# Patient Record
Sex: Female | Born: 1975 | Race: White
Health system: Northeastern US, Academic
[De-identification: ages and names within clinical notes are randomized; demographics above are authoritative.]

## PROBLEM LIST (undated history)

## (undated) DIAGNOSIS — K589 Irritable bowel syndrome without diarrhea: Secondary | ICD-10-CM

## (undated) DIAGNOSIS — Q796 Ehlers-Danlos syndrome, unspecified: Secondary | ICD-10-CM

## (undated) DIAGNOSIS — F3281 Premenstrual dysphoric disorder: Secondary | ICD-10-CM

## (undated) DIAGNOSIS — G909 Disorder of the autonomic nervous system, unspecified: Secondary | ICD-10-CM

## (undated) DIAGNOSIS — G43909 Migraine, unspecified, not intractable, without status migrainosus: Secondary | ICD-10-CM

## (undated) DIAGNOSIS — G8929 Other chronic pain: Secondary | ICD-10-CM

## (undated) DIAGNOSIS — R208 Other disturbances of skin sensation: Secondary | ICD-10-CM

## (undated) DIAGNOSIS — G43809 Other migraine, not intractable, without status migrainosus: Secondary | ICD-10-CM

## (undated) DIAGNOSIS — R5382 Chronic fatigue, unspecified: Secondary | ICD-10-CM

## (undated) DIAGNOSIS — R42 Dizziness and giddiness: Secondary | ICD-10-CM

## (undated) DIAGNOSIS — M7660 Achilles tendinitis, unspecified leg: Secondary | ICD-10-CM

## (undated) DIAGNOSIS — F419 Anxiety disorder, unspecified: Secondary | ICD-10-CM

## (undated) DIAGNOSIS — J45909 Unspecified asthma, uncomplicated: Secondary | ICD-10-CM

## (undated) DIAGNOSIS — G9332 Myalgic encephalomyelitis/chronic fatigue syndrome: Secondary | ICD-10-CM

## (undated) DIAGNOSIS — I959 Hypotension, unspecified: Secondary | ICD-10-CM

## (undated) DIAGNOSIS — J069 Acute upper respiratory infection, unspecified: Secondary | ICD-10-CM

## (undated) DIAGNOSIS — L309 Dermatitis, unspecified: Secondary | ICD-10-CM

## (undated) DIAGNOSIS — K219 Gastro-esophageal reflux disease without esophagitis: Secondary | ICD-10-CM

## (undated) DIAGNOSIS — M792 Neuralgia and neuritis, unspecified: Secondary | ICD-10-CM

## (undated) DIAGNOSIS — E669 Obesity, unspecified: Secondary | ICD-10-CM

## (undated) DIAGNOSIS — K449 Diaphragmatic hernia without obstruction or gangrene: Secondary | ICD-10-CM

## (undated) DIAGNOSIS — M1990 Unspecified osteoarthritis, unspecified site: Secondary | ICD-10-CM

## (undated) DIAGNOSIS — L509 Urticaria, unspecified: Secondary | ICD-10-CM

## (undated) DIAGNOSIS — G933 Postviral fatigue syndrome: Secondary | ICD-10-CM

## (undated) DIAGNOSIS — E162 Hypoglycemia, unspecified: Secondary | ICD-10-CM

## (undated) DIAGNOSIS — I471 Supraventricular tachycardia, unspecified: Secondary | ICD-10-CM

## (undated) DIAGNOSIS — G540 Brachial plexus disorders: Secondary | ICD-10-CM

## (undated) DIAGNOSIS — R0602 Shortness of breath: Secondary | ICD-10-CM

## (undated) DIAGNOSIS — R5383 Other fatigue: Secondary | ICD-10-CM

## (undated) DIAGNOSIS — Z8719 Personal history of other diseases of the digestive system: Secondary | ICD-10-CM

## (undated) DIAGNOSIS — N393 Stress incontinence (female) (male): Secondary | ICD-10-CM

## (undated) DIAGNOSIS — M357 Hypermobility syndrome: Secondary | ICD-10-CM

## (undated) DIAGNOSIS — M5126 Other intervertebral disc displacement, lumbar region: Secondary | ICD-10-CM

## (undated) DIAGNOSIS — T8859XA Other complications of anesthesia, initial encounter: Secondary | ICD-10-CM

## (undated) DIAGNOSIS — G4733 Obstructive sleep apnea (adult) (pediatric): Secondary | ICD-10-CM

## (undated) DIAGNOSIS — G4700 Insomnia, unspecified: Secondary | ICD-10-CM

## (undated) DIAGNOSIS — R200 Anesthesia of skin: Secondary | ICD-10-CM

## (undated) DIAGNOSIS — E785 Hyperlipidemia, unspecified: Secondary | ICD-10-CM

## (undated) DIAGNOSIS — R21 Rash and other nonspecific skin eruption: Secondary | ICD-10-CM

## (undated) DIAGNOSIS — I499 Cardiac arrhythmia, unspecified: Secondary | ICD-10-CM

## (undated) HISTORY — DX: Urticaria, unspecified: L50.9

## (undated) HISTORY — DX: Unspecified osteoarthritis, unspecified site: M19.90

## (undated) HISTORY — DX: Achilles tendinitis, unspecified leg: M76.60

## (undated) HISTORY — PX: TREATMENT FISTULA ANAL: SUR1390

## (undated) HISTORY — PX: TONSILLECTOMY: SUR1361

## (undated) HISTORY — DX: Neuralgia and neuritis, unspecified: M79.2

## (undated) HISTORY — DX: Anxiety disorder, unspecified: F41.9

## (undated) HISTORY — DX: Other migraine, not intractable, without status migrainosus: G43.809

## (undated) HISTORY — DX: Acute upper respiratory infection, unspecified: J06.9

## (undated) HISTORY — DX: Hypoglycemia, unspecified: E16.2

## (undated) HISTORY — DX: Dermatitis, unspecified: L30.9

## (undated) HISTORY — DX: Hypotension, unspecified: I95.9

## (undated) HISTORY — DX: Brachial plexus disorders: G54.0

## (undated) HISTORY — DX: Gastro-esophageal reflux disease without esophagitis: K21.9

## (undated) HISTORY — PX: INCISION AND DRAINAGE ABSCESS ANAL: SUR669

## (undated) HISTORY — DX: Premenstrual dysphoric disorder: F32.81

## (undated) HISTORY — DX: Chronic fatigue, unspecified: R53.82

## (undated) HISTORY — DX: Supraventricular tachycardia, unspecified: I47.10

## (undated) HISTORY — DX: Obesity, unspecified: E66.9

## (undated) HISTORY — DX: Other chronic pain: G89.29

## (undated) HISTORY — PX: SINOSCOPY: SHX187

## (undated) HISTORY — DX: Irritable bowel syndrome, unspecified: K58.9

## (undated) HISTORY — DX: Postviral fatigue syndrome: G93.3

## (undated) HISTORY — DX: Dizziness and giddiness: R42

## (undated) HISTORY — PX: ADENOIDECTOMY: SUR15

## (undated) HISTORY — DX: Ehlers-Danlos syndrome, unspecified: Q79.60

## (undated) HISTORY — DX: Myalgic encephalomyelitis/chronic fatigue syndrome: G93.32

## (undated) HISTORY — DX: Unspecified asthma, uncomplicated: J45.909

## (undated) HISTORY — DX: Other disturbances of skin sensation: R20.8

## (undated) HISTORY — DX: Migraine, unspecified, not intractable, without status migrainosus: G43.909

## (undated) HISTORY — DX: Disorder of the autonomic nervous system, unspecified: G90.9

## (undated) HISTORY — DX: Diaphragmatic hernia without obstruction or gangrene: K44.9

## (undated) HISTORY — DX: Supraventricular tachycardia: I47.1

## (undated) HISTORY — PX: HEMORROIDECTOMY: SUR656

## (undated) HISTORY — DX: Shortness of breath: R06.02

## (undated) HISTORY — PX: LUMBAR LAMINECTOMY: SHX95

## (undated) HISTORY — DX: Other intervertebral disc displacement, lumbar region: M51.26

## (undated) HISTORY — DX: Other complications of anesthesia, initial encounter: T88.59XA

## (undated) HISTORY — DX: Other fatigue: R53.83

## (undated) HISTORY — DX: Morbid (severe) obesity due to excess calories: E66.01

## (undated) HISTORY — DX: Hypermobility syndrome: M35.7

## (undated) HISTORY — DX: Irritable bowel syndrome without diarrhea: K58.9

## (undated) HISTORY — DX: Cardiac arrhythmia, unspecified: I49.9

## (undated) HISTORY — PX: TONSILLECTOMY AND ADENOIDECTOMY: SHX28

## (undated) HISTORY — DX: Rash and other nonspecific skin eruption: R21

## (undated) HISTORY — DX: Hyperlipidemia, unspecified: E78.5

## (undated) HISTORY — DX: Anesthesia of skin: R20.0

## (undated) HISTORY — PX: GASTRECTOMY: SHX58

## (undated) HISTORY — DX: Stress incontinence (female) (male): N39.3

## (undated) HISTORY — PX: SPINE SURGERY: SHX786

## (undated) HISTORY — PX: OTHER SURGICAL HISTORY: SHX169

## (undated) HISTORY — DX: Insomnia, unspecified: G47.00

## (undated) HISTORY — DX: Personal history of other diseases of the digestive system: Z87.19

---

## 2009-03-25 ENCOUNTER — Ambulatory Visit
Admit: 2009-03-25 | Discharge: 2009-03-25 | Disposition: A | Discharge status: Home / Self Care | Payer: Self-pay | Source: Ambulatory Visit | Attending: Obstetrics and Gynecology | Admitting: Obstetrics and Gynecology

## 2009-04-01 ENCOUNTER — Encounter: Payer: Self-pay | Admitting: Obstetrics and Gynecology

## 2009-04-01 ENCOUNTER — Observation Stay
Admit: 2009-04-01 | Disposition: A | Discharge status: Home / Self Care | Payer: Self-pay | Source: Ambulatory Visit | Attending: Obstetrics and Gynecology | Admitting: Obstetrics and Gynecology

## 2009-04-02 ENCOUNTER — Inpatient Hospital Stay
Admit: 2009-04-02 | Disposition: A | Discharge status: Home / Self Care | Payer: Self-pay | Source: Ambulatory Visit | Attending: Obstetrics and Gynecology | Admitting: Obstetrics and Gynecology

## 2010-01-24 ENCOUNTER — Ambulatory Visit: Payer: Self-pay | Admitting: Neurosurgery

## 2010-01-30 ENCOUNTER — Ambulatory Visit: Payer: Self-pay | Admitting: Neurosurgery

## 2010-02-02 HISTORY — PX: OTHER SURGICAL HISTORY: SHX169

## 2010-02-07 ENCOUNTER — Ambulatory Visit: Payer: Self-pay | Admitting: Neurosurgery

## 2010-03-10 ENCOUNTER — Ambulatory Visit: Payer: Self-pay | Admitting: Nurse Practitioner

## 2010-06-16 ENCOUNTER — Ambulatory Visit
Admit: 2010-06-16 | Discharge: 2010-06-16 | Disposition: A | Discharge status: Home / Self Care | Payer: Self-pay | Source: Ambulatory Visit | Attending: Obstetrics and Gynecology | Admitting: Obstetrics and Gynecology

## 2010-06-17 LAB — VAGINITIS SCREEN: DNA PROBE: Vaginitis Screen:DNA Probe: POSITIVE

## 2011-01-22 ENCOUNTER — Ambulatory Visit
Admit: 2011-01-22 | Discharge: 2011-01-22 | Disposition: A | Discharge status: Home / Self Care | Payer: Self-pay | Source: Ambulatory Visit | Attending: Obstetrics and Gynecology | Admitting: Obstetrics and Gynecology

## 2011-01-26 LAB — GYN CYTOLOGY

## 2011-01-26 LAB — HPV DNA PROBE WITH CYTOLOGY: HPV Hybrid Capture: NEGATIVE

## 2012-11-25 ENCOUNTER — Ambulatory Visit: Payer: Self-pay | Admitting: Sports Medicine

## 2012-11-25 ENCOUNTER — Other Ambulatory Visit: Payer: Self-pay | Admitting: Sleep Medicine

## 2012-11-25 ENCOUNTER — Encounter: Payer: Self-pay | Admitting: Sports Medicine

## 2012-11-25 VITALS — BP 133/61 | HR 74 | Ht 68.0 in | Wt 180.0 lb

## 2012-11-25 DIAGNOSIS — M25569 Pain in unspecified knee: Secondary | ICD-10-CM

## 2012-11-25 DIAGNOSIS — M2240 Chondromalacia patellae, unspecified knee: Secondary | ICD-10-CM

## 2012-11-25 DIAGNOSIS — IMO0002 Reserved for concepts with insufficient information to code with codable children: Secondary | ICD-10-CM

## 2012-11-25 MED ORDER — GENERIC DME *A*
Status: DC
Start: 2012-11-25 — End: 2017-12-17

## 2012-11-25 NOTE — Progress Notes (Signed)
History: Shari Harvey is a 37 y.o. that is being seen as a consult request from Dr. Demetra Shiner for evaluation of her Right knee pain.  There was no injury.  She has had pain and swelling in the knee for the past 6 weeks.  She has a history of joint hypermobility and swelling in her joints, but she states it usually resolves spontaneously.  She says that the pain is located patellar.  Aggravating factors include: walking and fully flexing and extending the knee.  Alleviating factors include:  nothing.  she tried treating it with ibuprofen which caused stomach upset.    Past medical history, past surgical history, medications, allergies, family history, social history, and review of systems were reviewed today and have been documented separately in this encounter.      Physical Examination:  She is in no acute distress.  She is alert and oriented x 3.     Examination of her right leg:She has no pain with internal or external rotation of the hip.  She can perform a straight leg raise.  Extension is to 0 and flexion is to 130.  Effusion: 1+.  The knee was tender along the Patella.  She has No patellar apprehension.  Lachman's was 1A.  Posterior drawer exam was Grade A. Varus stress test at 30 degrees of flexion was Negative Valgus stress test at 30 degrees of flexion was Negative.  McMurray's test was negative.  Hip abductor strength is Decreased. Ober's test was negative.  Distally she is neurovascularly intact.     Imaging: I personally reviewed the patients images. Xray's demonstrate some lateral compartment osteophytes and lateral compartment joint space narrowing.    Assessment: Right knee patellofemoral syndrome, lateral compartment OA.    Plan: We discussed the diagnosis of osteoarthritis and methods of treatment including maintaining a healthy weight, relative rest and activity modification, the use of NSAIDS and neutraceuticals such as glucosamine and chondroitin, as well as the role of therapeutic exercise  including directed physical therapy, aquatic therapy, or a home exercise program.  We discussed the role of bracing, corticosteriod injection, and viscosupplementation.      We discussed doing a cortisone injection to try and decreased the swelling.  We will also order inflammatory lab work and wrote her a prescription for a lateral unloader brace.  She will follow up in 6-8 weeks.    PROCEDURE:  Following informed consent, using a sterile technique, 5ml of 0.25% Marcaine without epinephrine and 2ml of Depo-Medrol 40mg /ml was injected into the patient's right knee.  The patient tolerated this without complication.  The patient was given our post-injection instruction sheet.      Jeb Levering PA performed the duties of a scribe for this encounter in the presence of the documenting physician. This patient was seen by Dr. Normajean Glasgow who personally examined the patient and determined the plan of care.

## 2012-11-27 ENCOUNTER — Ambulatory Visit
Admit: 2012-11-27 | Discharge: 2012-11-27 | Disposition: A | Discharge status: Home / Self Care | Payer: Self-pay | Source: Ambulatory Visit | Attending: Sports Medicine | Admitting: Sports Medicine

## 2012-11-27 DIAGNOSIS — M25569 Pain in unspecified knee: Secondary | ICD-10-CM

## 2012-11-27 LAB — CBC AND DIFFERENTIAL
Baso # K/uL: 0 10*3/uL (ref 0.0–0.1)
Basophil %: 0.3 % (ref 0.1–1.2)
Eos # K/uL: 0.2 10*3/uL (ref 0.0–0.4)
Eosinophil %: 1.5 % (ref 0.7–5.8)
Hematocrit: 43 % (ref 34–45)
Hemoglobin: 14.8 g/dL (ref 11.2–15.7)
Lymph # K/uL: 2.5 10*3/uL (ref 1.2–3.7)
Lymphocyte %: 25.3 % (ref 19.3–51.7)
MCV: 93 fL (ref 79–95)
Mono # K/uL: 0.7 10*3/uL (ref 0.2–0.9)
Monocyte %: 6.9 % (ref 4.7–12.5)
Neut # K/uL: 6.4 10*3/uL — ABNORMAL HIGH (ref 1.6–6.1)
Platelets: 352 10*3/uL (ref 160–370)
RBC: 4.7 MIL/uL (ref 3.9–5.2)
RDW: 12.4 % (ref 11.7–14.4)
Seg Neut %: 66 % (ref 34.0–71.1)
WBC: 9.7 10*3/uL (ref 4.0–10.0)

## 2012-11-27 LAB — CRP: CRP: <1 mg/L (ref 0–10)

## 2012-11-27 LAB — CALCIUM: Calcium: 9.3 mg/dL (ref 8.8–10.2)

## 2012-11-27 LAB — SEDIMENTATION RATE, AUTOMATED: Sedimentation Rate: 10 mm/hr (ref 0–20)

## 2012-11-28 LAB — ANTINUCLEAR ANTIBODY SCREEN: ANA Screen: NEGATIVE

## 2012-11-28 LAB — RHEUMATOID FACTOR,SCREEN: Rheumatoid Factor: <10 IU/mL

## 2012-12-01 LAB — VITAMIN D
25-OH VIT D2: <4 ng/mL
25-OH VIT D3: 41 ng/mL
25-OH Vit Total: 41 ng/mL (ref 30–60)

## 2012-12-10 ENCOUNTER — Telehealth: Payer: Self-pay | Admitting: Sleep Medicine

## 2012-12-10 NOTE — Telephone Encounter (Signed)
Patient notified bloodwork is normal.

## 2012-12-10 NOTE — Telephone Encounter (Signed)
Wondering if she can get blood work results

## 2012-12-11 LAB — HLA-B27 ANTIGEN: HLA-B27: NEGATIVE

## 2013-01-14 ENCOUNTER — Ambulatory Visit: Payer: Self-pay | Admitting: Sports Medicine

## 2013-02-03 ENCOUNTER — Ambulatory Visit: Payer: Self-pay | Admitting: Sports Medicine

## 2014-06-22 ENCOUNTER — Ambulatory Visit
Admit: 2014-06-22 | Discharge: 2014-06-22 | Disposition: A | Discharge status: Home / Self Care | Payer: Self-pay | Source: Ambulatory Visit | Attending: Allergy | Admitting: Allergy

## 2014-06-22 LAB — CBC AND DIFFERENTIAL
Baso # K/uL: 0.1 10*3/uL (ref 0.0–0.1)
Basophil %: 0.6 %
Eos # K/uL: 0.7 10*3/uL — ABNORMAL HIGH (ref 0.0–0.4)
Eosinophil %: 6.8 %
Hematocrit: 47 % — ABNORMAL HIGH (ref 34–45)
Hemoglobin: 15.8 g/dL — ABNORMAL HIGH (ref 11.2–15.7)
IMM Granulocytes #: 0.1 10*3/uL (ref 0.0–0.1)
IMM Granulocytes: 0.9 %
Lymph # K/uL: 1.7 10*3/uL (ref 1.2–3.7)
Lymphocyte %: 16.8 %
MCH: 32 pg/cell (ref 26–32)
MCHC: 34 g/dL (ref 32–36)
MCV: 94 fL (ref 79–95)
Mono # K/uL: 0.5 10*3/uL (ref 0.2–0.9)
Monocyte %: 5.2 %
Neut # K/uL: 6.8 10*3/uL — ABNORMAL HIGH (ref 1.6–6.1)
Nucl RBC # K/uL: 0 10*3/uL (ref 0.0–0.0)
Nucl RBC %: 0 /100 WBC (ref 0.0–0.2)
Platelets: 329 10*3/uL (ref 160–370)
RBC: 5 MIL/uL (ref 3.9–5.2)
RDW: 11.9 % (ref 11.7–14.4)
Seg Neut %: 69.7 %
WBC: 9.8 10*3/uL (ref 4.0–10.0)

## 2014-06-22 LAB — CRP: CRP: 2 mg/L (ref 0–10)

## 2014-06-22 LAB — IGG: IgG: 899 mg/dL (ref 700–1600)

## 2014-06-22 LAB — SEDIMENTATION RATE, AUTOMATED: Sedimentation Rate: 6 mm/hr (ref 0–20)

## 2014-06-22 LAB — IGM: IgM: 74 mg/dL (ref 40–230)

## 2014-06-22 LAB — IGA: IgA: 332 mg/dL (ref 70–400)

## 2014-06-23 LAB — IGE: IgE: 38 kU/L (ref 0–158)

## 2014-06-25 LAB — PNEUMOCOCCAL IGG AB
Pneumo Sero 1 IgG: 1.48 ug/mL
Pneumo Sero 12F IgG: 0.97 ug/mL
Pneumo Sero 14 IgG: 2.19 ug/mL
Pneumo Sero 18C IgG: 2.71 ug/mL
Pneumo Sero 19F IgG: 1.29 ug/mL
Pneumo Sero 23F IgG: 0.99 ug/mL
Pneumo Sero 3 IgG: 0.39 ug/mL
Pneumo Sero 4 IgG: 0.27 ug/mL
Pneumo Sero 6B IgG: 0.51 ug/mL
Pneumo Sero 7F IgG: 2.47 ug/mL
Pneumo Sero 8 IgG: 0.67 ug/mL
Pneumo Sero 9N IgG: 0.29 ug/mL
Pneumo Sero 9V IgG: 0.38 ug/mL
Pneumo Sero5 IgG: 15.84 ug/mL

## 2014-06-25 LAB — HAEMOPHILIUS INFLUENZA B AB: Haemophilus influenzae B AB: 3.4 ug/mL

## 2014-06-25 LAB — TETANUS TOXOID, IGG: Tetanus Ab: 3.6 IU/mL

## 2014-07-29 ENCOUNTER — Ambulatory Visit: Payer: Self-pay | Admitting: Otolaryngology

## 2014-07-29 ENCOUNTER — Encounter: Payer: Self-pay | Admitting: Otolaryngology

## 2014-07-29 ENCOUNTER — Telehealth: Payer: Self-pay | Admitting: Otolaryngology

## 2014-07-29 VITALS — BP 136/83 | HR 92 | Temp 98.1°F | Ht 68.0 in | Wt 178.6 lb

## 2014-07-29 DIAGNOSIS — J019 Acute sinusitis, unspecified: Secondary | ICD-10-CM

## 2014-07-29 DIAGNOSIS — J329 Chronic sinusitis, unspecified: Secondary | ICD-10-CM

## 2014-07-29 LAB — GRAM STAIN

## 2014-07-29 MED ORDER — CLARITHROMYCIN 500 MG PO TABS *I*
500.0000 mg | ORAL_TABLET | Freq: Two times a day (BID) | ORAL | Status: AC
Start: 2014-07-29 — End: 2014-08-18

## 2014-07-29 NOTE — Telephone Encounter (Signed)
Pt calling to see if anything sooner than 3/16.  Her sinus issues are much worse and she started to cry.   Please call her to get her in sooner.   Thank you

## 2014-07-29 NOTE — Telephone Encounter (Signed)
LVM for patient advising that currently nothing sooner w/ any provider

## 2014-07-29 NOTE — Progress Notes (Signed)
Subjective:      Shari Harvey is a 39 y.o. female who presents to the same day urgent ENT consult service with concerns of feeling sick.  She presents today complaining of many ENT complaints and general complains including : fatigue, weight gain, lack of interest in exercise, stiff neck, general malaise and fatigue. Her ENT complains include nasal congestion, sinus congestion, heavy eye lids, pain in the sinuses when she bends over, coughing worse at night, post nasal drip, itchy ears and eyes, dental pain, sore throat, sneezing, decreased smell/taste, headaches.  She does not get ear infections. She can get aural fullness.    Past ENT surgical history includes tonsillectomy at age 82, recurring adenoidectomies with a turbinate reduction, septoplasty at age 49 in [place], [place].   She has multiple environmental allergies/asthma is seeing Dr. Andreas Ohm where she will be starting allergy shots soon. Past history of SCIT that she responded well to. She is currently on Symbicort, Clarinex, Flonase (2.5 months consistently).  She was on 3 weeks of oral steroids in January '16 that she feels did not help. She uses a saline mist spray.   Past history of migraines. She feels her left side is worse because pain is worse on that side.    Her current symptoms began in the Spring of '15 that she was taking allergy medications that were not helping. Allergy symptoms are described as post nasal drip, sore throat, mild cough, itchy eyes/ears, frequent sneezing. She developed a sinus infection in August '15 with thick purulent nasal drainage, stiff neck, productive cough.  Augmentin resolved these symptoms.  Symptoms recurred in October which resolved with Augmentin. Symptoms began again with in January '16, was on Augmentin which was not helping, was then prescribed Avalox for one week (developed achilles pain) without helping symptoms. She was then prescribed Ceftin. She was on 3 weeks of prednisone - does not know the dose.  Chest  xray at that time revealed pneumonia - I do not have access to these records. Repeat Xray was clear.  On 07/06/14 symptoms returned and was prescribed Biaxin. She flew to [place] on 07/16/14. While in [place] and on Biaxin, her symptoms resolved. Biaxin was for ten days and completed on 07/23/14.  When she returned to [place], symptoms returned on 07/24/14 and have been lingering since.    Current symptoms include purulent rhinorrhea, pan sinus pain of the frontal, ethmoidal and maxillary areas, worse on the left, feeling feverish (nothing recorded), and her other baseline symptoms. All other ROS denied.    History:       has a past medical history of Hypermobility syndrome; Asthma; Migraines; and Anxiety.   has past surgical history that includes Tonsillectomy and adenoidectomy; Cesarean Section, Unspecified; and lumbar laminectomy.   reports that she has quit smoking. She does not have any smokeless tobacco history on file.  family history includes Asthma in her sister; Cancer in her mother; Migraines in her sister and sister; Osteoarthritis in her father, sister, and sister; Thyroid disease in her sister and sister.    Allergies:   Shellfish-derived products; No known drug allergy; and No known latex allergy     Medications:     Current Outpatient Prescriptions   Medication Sig    promethazine (PHENERGAN) 25 MG tablet Take 25 mg by mouth every 8 hours as needed    FOR NAUSEA    SYMBICORT 160-4.5 MCG/ACT inhaler Inhale 2 puffs into the lungs 2 times daily    SUMAtriptan (IMITREX) 100 MG  tablet Take 100 mg by mouth as needed for Migraine   Take at onset of headache. May repeat once in 2 hours.    desloratadine (CLARINEX) 5 MG tablet Take 5 mg by mouth daily    sertraline (ZOLOFT) 50 MG tablet Take 50 mg by mouth daily    oxyCODONE-acetaminophen (PERCOCET) 5-325 MG per tablet Take 1 tablet by mouth every 4-6 hours as needed for Pain       acetaminophen (TYLENOL) 500 mg tablet Take 500 mg by mouth every 6 hours  as needed for Pain    Multiple Vitamin (MULTI-VITAMIN PO) Take by mouth    albuterol HFA 108 (90 BASE) MCG/ACT inhaler Inhale 2 puffs into the lungs every 6 hours as needed for Wheezing   Shake well before each use.    Omega-3 Fatty Acids (FISH OIL) 600 MG CAPS Take by mouth    generic DME Lateral unloader brace.     Objective:      BP 136/83 mmHg   Pulse 92   Temp(Src) 36.7 C (98.1 F)   Ht 1.727 m ( )   Wt 81 kg (178 lb 9.2 oz)   BMI 27.16 kg/m2    General:   Normocephalic, well nourished, with appropriate affect in NAD.  A &O x 3.     Head and Face:  The head and face reveal normal facial symmetry without lesions or scars. The salivary glands are palpated and appear normal without tenderness or mass.    EYES:  Non icteric, normal conjunctiva.  EOMI.   Ears: RIGHT EAR:  Ear pinna is normal in appearance with no scars, lesions or masses. Mastoid bone is non tender. The ear canal is clear.  The tympanic membrane is intact without inflammation or perforation. TM is mobile to pneumatic otoscopy without evidence of middle ear effusion.     LEFT EAR:  Ear pinna is normal in appearance with no scars, lesions or masses. Mastoid bone is non tender. The ear canal is clear. The tympanic membrane is intact without inflammation or perforation. TM is mobile to pneumatic otoscopy without evidence of middle ear effusion.    Nose:    External nose is normal. The nasal mucosa is inflamed. The nasal septum is generally midline and there is no evidence of septal perforation or hematoma. The inferior turbinates are normal without masses or obstructions. No polyps are visualized. The paranasal sinuses are non tender.   Mouth / Throat:   ORAL CAVITY: The lips, teeth, tongue and buccal mucosa appear normal without lesions or inflammation.  The uvula and soft palate are normal.  The tonsils are absent - the adenoids are visualized.  Oropharynx is without lesion, inflammation or swelling.   Neck:  There is no asymmetry or cervical  lymphadenopathy noted, though she notes tenderness of level II bilaterally. There are no evident masses, trachea is midline and the thyroid gland reveals no enlargement, tenderness nor mass effect.        Assessment:       39 y.o. female with acute on chronic sinusitis, allergic rhinitis     Plan:     She has a current infection and cultures were taken.  We discussed starting abx or waiting for culture. She said she could not wait any further and Biaxin was prescribed for 20 days. I will order a CT scan at the end of the antibiotics to assess her sinuses. May benefit from surgery. We discussed surgery alone will not be curative. We discussed  the importance of treating her allergies and understands this as she is following up with SCIT.  This may initially worsen her symptoms.  She was given a sample saline rinse bottle. We discussing using Afrin for 3-4 days and overdose was discussed.  She will follow up as already scheduled. It has been a pleasure participating in the care of your patient. Thank you for your [facility] to [facility].  If you have any questions, please do not hesitate to contact me.    Procedure:     Diagnostic nasal endoscopy:  Both nostrils were atomized with phenylephrine and tetracaine.  After the spray she noticed great improvement in her congestion. A flexible fiberoptic nasal endoscope was introduced into both nasal  cavities.The patient tolerated the procedure well without incident.    In the right nasal cavity the osteomeatal complex was edematous, erythematous with yellow drainage posteriorly. No obstruction or polyps. There was yellow purulence seen draining from the sphenoethmoidal recess. The nasopharynx was with large adenoids filling 70% of the NP that was not obstructive.  There was minimal pooling of purulence.      In the left nasal cavity the osteomeatal complex was minimally patent erythema/edema and without obstruction, drainage. The was yellow drainage  seen from the sphenoethmoidal recess.

## 2014-07-30 ENCOUNTER — Encounter: Payer: Self-pay | Admitting: Otolaryngology

## 2014-07-30 NOTE — Communication Body (Signed)
Booked CT SInus w/o contrast at [facility]  3.16.16   8AM (No Prep)  F/U same day 3.16.16  9AM  Dr. Man  Auth#  [id number] exp  4.11.16  Confirmed Pt   2.26.16   1:24PM   SC

## 2014-08-03 ENCOUNTER — Telehealth: Payer: Self-pay | Admitting: Otolaryngology

## 2014-08-03 MED ORDER — DOXYCYCLINE HYCLATE 100 MG PO TABS *I*
100.0000 mg | ORAL_TABLET | Freq: Two times a day (BID) | ORAL | Status: DC
Start: 2014-08-03 — End: 2014-08-22

## 2014-08-03 NOTE — Telephone Encounter (Addendum)
-----   Message from [name], [place] sent at 08/02/2014  7:16 PM EST -----  Regarding: How's she doing?  I saw her last week for a sinus infection. She did not want to wait for cultures. Cultures returned with two bacteria. One was MRSA that was very resistant.  I put her on Biaxin.  The MRSA will not be effected by this.    How is she doing? If she is no better, I wouldn't be surprised.   Let me know,  thanks  T

## 2014-08-03 NOTE — Telephone Encounter (Signed)
Spoke with Shari Harvey, and relayed Shari Harvey's advisement.  She is concerned about the cultures and bacteria that grew.  She is inquiring if the staph infection is MRSA.  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  Also her 39 year old has had the same symptoms, not getting better and is wondering if she has the same thing.  Shari Harvey is requesting a call from Shari Harvey to further discuss her results.  Routing to [employer].     Shari BlenderSiala, Tarek, PA  Acsa Estey, Gearldine Shownherie M, RN      Caller: Unspecified (Today, 9:23 AM)                biaxin will specifically NOT work. Stop it.   May need another two new ones. Will start with Doxycycline for now. I want her togive me an update in two days. At the end of the antibiotic, I may consider starting another one.         Previous Messages

## 2014-08-03 NOTE — Telephone Encounter (Signed)
Spoke with Shari Harvey.  She stated she feels "maybe a tiny bit better".  She stated she is still experiencing yellow nasal drainage, sinus pressure/headaches, frequent coughing at night, and believes she is running fevers at night due to waking with night sweats.  Shari Harvey stated she is drinking plenty of fluids.  I will DW [name], PA for his consideration.

## 2014-08-03 NOTE — Telephone Encounter (Signed)
Patient is asking for clarification that she should continue taking Biaxin in addition to doxycycline.  I will DW Tarek.    Theodosia BlenderSiala, Tarek, PA  Rapheal Masso, Gearldine Shownherie M, RN      Caller: Unspecified (Today, 9:23 AM)                Peggye FormI've prescribed an antibiotic. May need two. For now I will go with one which was sent to her pharmacy.   Have her give me an update in 48 hours.   T

## 2014-08-04 NOTE — Telephone Encounter (Signed)
Theodosia BlenderSiala, Tarek, PA  Chyanne Kohut, Gearldine Shownherie M, RN     Caller: Unspecified (Yesterday, 9:23 AM)                I called her back and left a message saying she does have MRSA. I advised if she calls next time in the office to speak with my nursing staff to help communicate with me. I told her doxycycline is prescribed to treat the MRSA and that we may need to go on Augmentin or so to treat H.flu.   T

## 2014-08-05 NOTE — Telephone Encounter (Signed)
Ptcalling to let Dr Cheri RousSiala know that she doesn't feel a lot better and she is still coughing a lot and coughing up stuff.  Please call her to let her know the next step.   Thank you

## 2014-08-05 NOTE — Telephone Encounter (Signed)
From: Theodosia BlenderSiala, Tarek, [place]     Sent: 08/05/2014   2:50 PM       To: Sheron NightingaleEnt [place] Nurse    I saw her last week for a sinus infection. Cultures grew MRSA which will respond to doxy.  I started her on Doxy two days ago. She called today saying she feels better, though does not feel cured.  I do not expect her to feel cured. I would expect minimal improvement in two days, which she says she does.  She is scared that her culture grew MRSA. I called her two days ago saying cultures did respond to doxy.  It is possible her other bug on culture, (H.flu) would respond to this as well.  So I want her to go on doxy for 5-7 days before I consider adding another antibiotic. What I dont' want to do is give her C.diff.  We must be a little patient here. Continue saline rinses and give doxy some time.

## 2014-08-05 NOTE — Telephone Encounter (Signed)
Call to patient.  She states that she has been taking the doxycycline.  I relayed the information per Shari Harvey to patient.  She states her understanding.  She will call back Monday if she is not improving more by then.

## 2014-08-08 LAB — AEROBIC CULTURE

## 2014-08-09 ENCOUNTER — Telehealth: Payer: Self-pay | Admitting: Otolaryngology

## 2014-08-09 NOTE — Telephone Encounter (Signed)
Still on the doxy.  Routed to [name], [place] for his consideration

## 2014-08-09 NOTE — Telephone Encounter (Signed)
Guess i can't edit documentation.... Great.    Was saying there may not be more that I can do if she comes in besides peeking in her sinuses again. Follow up next week with Dr. Man  And Ct scan as scheduled.

## 2014-08-09 NOTE — Telephone Encounter (Signed)
Having a sinus infectin with MRSA will require time for improvement.  She may have unrealistic expectations.  i suspect her anxiety is playing a factor here.  She will stay on Doxy for now.  You can offer her another appointment with me again and I can repeat nasal endoscopy and re culture.  This may result with little to nothing

## 2014-08-09 NOTE — Telephone Encounter (Signed)
LM for pt to relay Tarek's advisement

## 2014-08-09 NOTE — Telephone Encounter (Signed)
Shari Harvey is calling back to see what the plan is for her treatment.  Since the antibiotic, she states that there has been no change.  She is still sick.  She can be reached at [phone].

## 2014-08-18 ENCOUNTER — Encounter: Payer: Self-pay | Admitting: Otolaryngology

## 2014-08-18 ENCOUNTER — Ambulatory Visit: Payer: Self-pay | Admitting: Otolaryngology

## 2014-08-18 VITALS — BP 126/75 | HR 80 | Temp 97.8°F | Ht 68.0 in | Wt 230.8 lb

## 2014-08-18 DIAGNOSIS — J329 Chronic sinusitis, unspecified: Secondary | ICD-10-CM

## 2014-08-22 ENCOUNTER — Encounter: Payer: Self-pay | Admitting: Otolaryngology

## 2014-08-22 NOTE — Progress Notes (Signed)
Reason for Visit  "Frequent bacterial sinus infections."    HPI  Shari Harvey is a 39 y.o. female who presents with recurrent acute rhinosinusitis. She feels she has been sick for the past year. She reports decreased energy and 60 pound weight gain over the past year. She reports being on 5 antibiotics as well as oral steroids over the past 6 months or so. She was on Augmentin in August, October, and December 2015. Earlier this year she was on Avelox x 7 days (did nothing) and then Ceftin and Biaxin in conjunction with 3 weeks of Medrol. She uses saline irrigations, Flonase twice daily, and Symbicort regularly. She just finished 10 days of doxycycline 1 week ago and feels pretty well. She had allergy testing with Dr. Berlin Hun and was found to have sensitivities to dust mites, tree pollens, and rats ("very allergic ... had them as pets"). She had been previously allergic to dog and rabbit dander and had allergy immunotherapy as a child. She feels her sense of smell is diminished.    The Sino-Nasal Outcome Test (SNOT)-22 score on 08/18/2014 was 64 out 110, with higher scores indicating more severe symptoms.    Nasal Symptom Inventory  Facial or sinus pressure:  Mild   Facial or sinus pain:  None   Headache:  None   Nasal congestion:  Mild   Nasal obstruction:  None   Post-nasal drip:  Mild   Clear nasal discharge:   Mild   Discolored nasal discharge:  Mild   Itchy nose/eyes/throat:  Mild   Nose bleeds: Mild   Tiredness:  Mild   Wheezing:  Mild   Cough:  Mild     Allergies  She is allergic to shellfish-derived products; no known drug allergy; and no known latex allergy.    PMH  She has a past medical history of Hypermobility syndrome; Asthma; Migraines; and Anxiety.    PSH  She has past surgical history that includes Tonsillectomy and adenoidectomy; Cesarean Section, Unspecified; and lumbar laminectomy. She had adenoidectomy in approximately 1982, 1992, and 1998. She had turbinate reduction and septoplasty in  approximately 1998.    FH  Her family history includes Asthma in her sister; Cancer in her mother; Migraines in her sister and sister; Osteoarthritis in her father, sister, and sister; Thyroid disease in her sister and sister.    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 She  reports that she has quit smoking. She does not have any smokeless tobacco history on file. Her alcohol, drug, and sexual activity histories are not on file.    ROS  Positive for items listed in HPI/NSI. Other organ systems negative. Reviewed and scanned into electronic medical record.    PE  Vitals Blood pressure 126/75, pulse 80, temperature 36.6 C (97.8 F), height 1.727 m ( ), weight 104.69 kg (230 lb 12.8 oz). Body mass index is 35.1 kg/(m^2).   General General appearance normal. Alert and cooperative. Voice normal.   Head/Face Normocephalic; atraumatic. No facial skin lesions. Facial strength symmetric.   Eyes Extraocular muscles intact. No nystagmus with lateral gaze.   Ears, Nose, Mouth & Throat Clinical speech thresholds normal. External ears normal. External nose normal. Lips, teeth, and gums normal. Oral mucosa moist, without lesions. Tongue and FOM without masses.    Neck No masses. Normal appearance for age.    Respiratory Normal respiratory effort. No retractions.   Cardiovascular Peripheral vascular system normal.   Lymphatic No palpable  cervical adenopathy.   Neuro/Psych CNII-XII grossly normal. Alert and oriented. Mood and affect normal.     Results   CT sinus (08/18/2014, images reviewed with patient on 08/18/2014) - Mild mucosal thickening in the maxillary and ethmoid sinuses. Minimal mucosal thickening in the frontal and sphenoid sinuses. Small left Haller cell.     Assessment  1. Recurrent acute rhinosinusitis.  2. Allergic rhinitis.    Plan   I reviewed the images of the CT sinus that was performed today. (Ordered by our PA, [name] after a visit in 07/2014.)   I  recommended continuing with her current regimen.   I recommended starting immunotherapy with Dr. Berlin HunQuaidoo.   Return to clinic on an as-needed basis. Certainly we can obtain sinonasal cultures during exacerbations, if needed.

## 2015-02-08 ENCOUNTER — Telehealth: Payer: Self-pay | Admitting: Otolaryngology

## 2015-02-08 NOTE — Telephone Encounter (Signed)
Spoke w/ patient. She stated she had an uncontrollable nosebleed and went to Urgent care yesterday; they cauterized the bleed. She currently has bloody flem. She wants to know if this is bleeding going down her throat instead of out her nose. Please advise.    Call pt at [phone] (H)

## 2015-02-08 NOTE — Telephone Encounter (Signed)
I could see her sometime this week before she goes to [facility]. Not today. If she demands today, you can run it by Dr. Hyacinth Meeker.  T

## 2015-02-08 NOTE — Telephone Encounter (Signed)
Spoke with Shari Harvey.  12 years ago she was using Steroid nasal sprays which caused nosebleeds.  Recently due to allergies she was reluctantly placed back on nasal steroids for severe allergy symptoms.  She had an episode of epistaxis yesterday lasting over 3 hours that she was unable to get under control.  She went to Urgent Care where they pulled out a large clot and cauterized the area.  Since her cauterization she continues to produce frank red blood when she spits.  She stated this is all that she can taste is blood as it continues to run down the back of her throat.  Shari Harvey stated this is consistent bleeding and not intermittent.  She is leaving for [facility] on Friday and concerned.  Dr. Man is out of the office and Maryln Gottron is full.  Routing to [employer] for his advisement.

## 2015-02-08 NOTE — Telephone Encounter (Signed)
Offered appointment with Tarek tomorrow at 10:00 AM.  Advised Herbert if she begins to bleed heavy and/or vomits due to bleeding she should go to the [facility].  Patrici voiced her understanding and agreed with plan.

## 2015-02-09 ENCOUNTER — Encounter: Payer: Self-pay | Admitting: Otolaryngology

## 2015-02-09 ENCOUNTER — Ambulatory Visit: Payer: Self-pay | Admitting: Otolaryngology

## 2015-02-09 VITALS — BP 133/85 | HR 77 | Ht 68.0 in | Wt 219.0 lb

## 2015-02-09 DIAGNOSIS — J309 Allergic rhinitis, unspecified: Secondary | ICD-10-CM

## 2015-02-09 DIAGNOSIS — R040 Epistaxis: Secondary | ICD-10-CM

## 2015-02-09 NOTE — Progress Notes (Signed)
Subjective:      Shari Harvey is a 39 y.o. female who presents to the same day ENT consult service with concerns of nose bleeds.  She's has been seen in our clinic by Dr. Lowella Dandy and myself - last seen in March '16 for sinusitis that responded to antibiotics.  She has had a septoplasty and IT reduction in '98.  Past history of allergic rhinitis where she is undergoing SCIT with Dr. Andreas Ohm.  She is hesitant to use nasal steroids due to recalcitrant epistaxis that last occurred 15 years ago. Since her allergies were getting difficult to treat she re started steroid spray in February '16.  She had minimal bleeding since then. Bad nose bleed was on 9/3 and then on 9/5 where after 3 hours she could not stop bleeding. She was seen by Dr. Laurena Spies at [facility] where cautery was performed on the left anterior nasal septum. He told her to use saline and she forgot. She continued to bleed after that and this morning feels the bleed has finally stopped. She took an 800 mg ibuprofen on 9/5.     No lifting. She gets constipation, not currently.     History:    has a past medical history of Anxiety; Asthma; Hypermobility syndrome; and Migraines.   has a past surgical history that includes Tonsillectomy and adenoidectomy; Cesarean Section, Unspecified; and lumbar laminectomy.   reports that she has quit smoking. She does not have any smokeless tobacco history on file.  family history includes Asthma in her sister; Cancer in her mother; Migraines in her sister and sister; Osteoarthritis in her father, sister, and sister; Thyroid disease in her sister and sister.    Allergies:   Shellfish-derived products and Avelox [moxifloxacin]     Objective:      BP 133/85  Pulse 77  Ht 1.727 m ( )  Wt 99.3 kg (219 lb)  BMI 33.3 kg/m2    General:   Normocephalic, well nourished, with appropriate affect in NAD.  A &O x 3.     Head and Face:  The head and face reveal normal facial symmetry without lesions or scars. The salivary glands are palpated and  appear normal without tenderness or mass.    EYES:  Non icteric, normal conjunctiva.  EOMI.       Nose:    External nose is normal. The nasal mucosa is not inflamed. The nasal septum is generally midline with dried secretions bilaterally - large dried blood on the left - this was removed without bleeding. The inferior turbinates are normal without masses or obstructions. No polyps are visualized. The paranasal sinuses are non tender.   Mouth / Throat:   ORAL CAVITY: The lips, teeth, tongue and buccal mucosa appear normal without lesions or inflammation.  The uvula and soft palate are normal.  The tonsils are non erythematous or swollen.  Oropharynx is without lesion, inflammation or swelling.   Neck:  There is no asymmetry or cervical lymphadenopathy noted in either anterior or posterior neck chains or supraclavicular fossa bilaterally. There are no evident masses, trachea is midline and the thyroid gland reveals no enlargement, tenderness nor mass effect.        Assessment:       39 y.o. female with epistaxis, allergic rhinitis     Plan:     The nose appeared dry - cautery performed prior is well done. The patient was instructed to avoid the following:  lifting anything over 20 lbs, excessive exercise, nose manipulation,  and nose blowing for the next 10 days.  Patient was instructed to increase nasal hydration with saline gel, mist or Vaseline.  Handout on epistaxis was given with instructions how to stop future nose bleeds.  Patient will call back with future epistaxis episodes. It has been a pleasure participating in the care of your patient. Thank you for your referral to [facility].  If you have any questions, please do not hesitate to contact me.

## 2015-02-09 NOTE — Patient Instructions (Signed)
Care and Prevention of Nosebleeds   (Epistaxis)     Most nosebleeds are mere nuisances; but some are quite frightening, and a few are even life threatening.  Physicians classify nosebleeds into two major different types.  1.  Anterior Nosebleed: (Most common) The nosebleed that comes from the front part of the nose and begins with a flow of blood out one or the other nostril if the patient is sitting up or standing.  This is what you have.  2.  Posterior Nosebleed: (Rare) The nosebleed that comes from deep in the nose and flows down the back of the mouth and throat even if the patient is sitting up or standing.    Obviously, if the patient is lying down, even the anterior nosebleeds seem to flow in both directions, especially if the patient is coughing or blowing his nose.  Nevertheless, it is important to try to make the distinction since posterior nosebleeds are often more severe and almost always require the physician's care.  Posterior nosebleeds are more likely to occur in older people, persons with high blood pressure, and in cases of injury to the nose or face.    Nosebleeds in children are almost always of the anterior type.  Anterior nosebleeds are common in dry climates or during the winter months when the dry air parches the nasal membranes so that they crust, crack, and bleed.  This can be prevented if you will place a bit of lubricating cream or ointment about the size of a pea on the end of your fingertip and then rub it up inside the nose, especially on the middle portion (the septum).    Many physicians suggest any of the following lubricating creams or ointments.  They can all be purchased without a prescription: Borofax Ointment, A and D Ointment, Mentholatum, Vicks Vaporub, Nozoil, Aquaphor, and Vaseline.  Up to three applications a day may be needed, but usually every night at bedtime is enough.  Nasal Saline mist is also helpful. Nasal saline gel in an injectable form is newly available marketed  under Neil Med Nasogel Drip free, or Ayr Nasal gel no drip mist.  If the nosebleeds persist, you should see your doctor, who may recommend nasal cautery to the blood vessel that is causing the trouble.      To Stop An Anterior Nosebleed    If you or your child has an anterior nosebleed, you may be able to care for it yourself using the following steps:    1.  Pinch all the soft parts of the nose together between your thumb and two fingers.  2.  Press firmly toward the face - compressing the pinched parts of the nose against the bones of the face.  3.  Hold it for 15 minutes (timed by a clock).  Can also buy a Nasal clamp/ Nasal clip online.  4.  Keep head higher than the level of the heart - sit up or lie with head elevated.   5. Apply ice (crushed in a plastic bag or washcloth) to nose and cheeks.         To Prevent Re-bleeding After Bleeding Has Stopped - follow for at least 10 days - 2 weeks  6.  Do not pick or blow nose (sniffing is all right).  Keep showers luke warm.  7.  Do not strain or bend down to lift anything heavy.  8.  Keep head higher than the level of the heart.        If Re-bleeding Occurs   9.  Spray nose four times on both sides with decongestant spray (such as Afrin, Duration, Neo-Synephrine, etc.).  Cannot use more than 4 days in a row or more bleeding will occur.   10.  Pinch and press nose into face again, as in steps 1-3 above.  11.  Can buy QR/Wound Seal on line - rub this product against the middle wall.  12.  Nasal Cease can be bought online to pack the nose.  11.  Call your doctor if the bleeding persists.    When to Call The Doctor or Go To the Emergency Room  If the nasal bleeding cannot be stopped or keeps reappearing.  If bleeding is rapid or if blood loss is large.  If you feel weak or faint, presumably from blood loss or anxiety.  If bleeding begins by going down the back of the throat rather than the front of the nose.

## 2015-03-02 ENCOUNTER — Telehealth: Payer: Self-pay | Admitting: Otolaryngology

## 2015-03-02 NOTE — Telephone Encounter (Signed)
Patient calling states that she believes she may have a sinus infection.  She was told to come to see our office when this happens due to last time she was in to see Dr. Man it was found by a sinus culture that her infection could only be treated by a specific medication.  She states that she has been sick for about 2 weeks now.  Dr. Man is booking out.  Advised I would send this message to nursing for advisement.

## 2015-03-02 NOTE — Telephone Encounter (Signed)
Call to patient.  She said she is having a lot of coughing.  She has been struggling with her asthma.  She said her head and ears feel full.  She said she is having clear nasal discharge.  She is having tooth pain on her left side with left sided facial pressure.  She is achy and is very tired.  Will route Dr. Man to see if he is able to fit the patient in sooner.

## 2015-03-02 NOTE — Telephone Encounter (Signed)
Call to patient.  I left a VM message requesting a return call.

## 2015-03-02 NOTE — Telephone Encounter (Signed)
What is the duration of symptoms. Clear nasal discharge makes it less likely that her symptoms are from a bacterial infection. Can offer an appointment on October 3 at 7:30 am.

## 2015-03-02 NOTE — Telephone Encounter (Signed)
Patient called back.I tried dialing your extension but there was no answer. I told the patient I would let you know you returned her call.

## 2015-03-03 NOTE — Telephone Encounter (Signed)
Spoke to patient and scheduled appointment on 10/3 at 7:30am.

## 2015-03-03 NOTE — Telephone Encounter (Signed)
Duration of symptoms have been about 2 weeks noted in previous encounter.  Routing to scheduling staff to contact patient and offer appointment per Dr. Man below, March 07, 2015 7:30 AM.  She she should follow up with her PCP if she feels she needs to be seen before then.  Thank you.

## 2015-03-07 ENCOUNTER — Ambulatory Visit: Payer: Self-pay | Admitting: Otolaryngology

## 2015-03-07 ENCOUNTER — Encounter: Payer: Self-pay | Admitting: Otolaryngology

## 2015-03-07 VITALS — BP 118/77 | HR 70 | Ht 68.0 in | Wt 217.8 lb

## 2015-03-07 DIAGNOSIS — J309 Allergic rhinitis, unspecified: Secondary | ICD-10-CM

## 2015-03-07 DIAGNOSIS — J069 Acute upper respiratory infection, unspecified: Secondary | ICD-10-CM

## 2015-03-07 DIAGNOSIS — R040 Epistaxis: Secondary | ICD-10-CM

## 2015-03-07 LAB — GRAM STAIN

## 2015-03-07 NOTE — Addendum Note (Signed)
Addended by: Mauricia Area on: 03/07/2015 08:36 AM     Modules accepted: Orders

## 2015-03-07 NOTE — Progress Notes (Signed)
Reason for Visit  Fever/malaise.    HPI  Shari Harvey is a 39 y.o. female who presents with 3 weeks of malaise, excessive mucus production, cough, and congestion. She feels she has a viral infection or an exacerbation of allergies as the mucus is clear. She has had some lightheadedness as well. She has been using Mucinex, Sudafed, Clarinex, Benadryl, and saline irrigations (when she remembers). She was prescribed montelukast but has not started it. She has been on immunotherapy with Dr. Berlin Hun since April 2016, but is having some trouble increasing the immunotherapy concentrations for trees and grasses. She recently had more epistaxis and was cauterized on the left in an ED. She feels the epistaxis is related to intranasal steroid use. She has stopped Flonase. She does feel the intranasal steroids helped for her allergy symptoms. She has not tried an aerosolized intranasal spray nor has she every tried irrigating with a steroid in the rinse bottle.     When I last saw her in March 2016, she reported decreased energy and 60 pound weight gain over the past year. She reported being on 5 antibiotics as well as oral steroids between August 2015 and March 2016. She was on Augmentin in August, October, and December 2015. In early 2016, she was on Avelox x 7 days (did nothing) and then Ceftin and Biaxin in conjunction with 3 weeks of Medrol. She had allergy testing with Dr. Berlin Hun and was found to have sensitivities to dust mites, tree pollens, and rats ("very allergic ... had them as pets"). She had been previously allergic to dog and rabbit dander and had allergy immunotherapy as a child.     The Sino-Nasal Outcome Test (SNOT)-22 score on 03/07/2015 was 39 out 110, with higher scores indicating more severe symptoms.    Nasal Symptom Inventory  Facial or sinus pressure:  Mild   Facial or sinus pain:  None   Headache:  None   Nasal congestion:  Mild   Nasal obstruction:  None   Post-nasal drip:  Mild   Clear nasal  discharge:   Mild   Discolored nasal discharge:  Mild   Itchy nose/eyes/throat:  Mild   Nose bleeds: Mild   Tiredness:  Mild   Wheezing:  Mild   Cough:  Mild     Allergies  She is allergic to shellfish-derived products; avelox [moxifloxacin]; environmental allergies; and flonase [fluticasone].    PMH  She has a past medical history of Anxiety; Asthma; Hypermobility syndrome; and Migraines.    PSH  She has a past surgical history that includes Tonsillectomy and adenoidectomy; Cesarean Section, Unspecified; and lumbar laminectomy. She had adenoidectomy in approximately 1982, 1992, and 1998. She had turbinate reduction and septoplasty in approximately 1998.    FH  Her family history includes Asthma in her sister; Cancer in her mother; Migraines in her sister and sister; Osteoarthritis in her father, sister, and sister; Thyroid disease in her sister and sister.    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 She  reports that she has quit smoking. She does not have any smokeless tobacco history on file. Her alcohol, drug, and sexual activity histories are not on file.    ROS  Positive for items listed in HPI/NSI. Other organ systems negative. Reviewed and scanned into electronic medical record.    PE  Vitals Blood pressure 118/77, pulse 70, height 1.727 m ( ), weight 98.8 kg (217 lb 12.8 oz). Body mass index is  33.12 kg/(m^2).   General General appearance normal. Alert and cooperative. Voice normal.   Head/Face Normocephalic; atraumatic. No facial skin lesions. Facial strength symmetric.   Eyes Extraocular muscles intact. No nystagmus with lateral gaze.   Ears, Nose, Mouth & Throat Clinical speech thresholds normal. External ears normal. Dark cerumen (small amount) in R EAC. Septum is very dry bilaterally. Cautery site on the left identified. Mucosa is edematous. There is dry stringy mucus.   Neck No masses. Normal appearance for age.    Respiratory Normal respiratory effort.  No retractions.   Cardiovascular Peripheral vascular system normal.   Lymphatic No palpable cervical adenopathy.   Neuro/Psych CNII-XII grossly normal. Alert and oriented. Mood and affect normal.     Results   CT sinus (08/18/2014, images reviewed with patient on 08/18/2014) - Mild mucosal thickening in the maxillary and ethmoid sinuses. Minimal mucosal thickening in the frontal and sphenoid sinuses. Small left Haller cell.     Assessment  1. Acute URI/viral rhinitis.  2. Recurrent acute rhinosinusitis.  3. Allergic rhinitis.    Plan   Nasal endoscopy was performed to assess for drainage from the paranasal sinuses. There was clear discharge in the right middle meatus. No purulence was seen. The right inferior turbinate was edematous. A culture was taken of the right middle meatal drainage.   We discussed the findings. The findings are more consistent with a viral infection at this time.   I will let Ms. Carley know about the culture results.   In the meantime, she will start the montelukast.   We discussed nasal saline spray and nasal saline gel to prevent epistaxis. She may also [place] a cotton-tipped applicator to apply OTC antibiotic ointment or Vaseline to the anterior nares. This can be used prior to using an intranasal aerosol. She will discuss with Dr. Berlin Hun on whether she may be candidate for Qnasl/Qvar with baby bottle nipple.    I reassured Ms. Muro regarding the dark cerumen from the right ear.   Continue care with Dr. Berlin Hun.   Follow-up on an as-needed basis.

## 2015-03-07 NOTE — Addendum Note (Signed)
Addended by: Holley Dexter on: 03/07/2015 08:58 AM     Modules accepted: Orders

## 2015-03-11 LAB — AEROBIC CULTURE

## 2015-03-14 ENCOUNTER — Encounter: Payer: Self-pay | Admitting: Otolaryngology

## 2015-03-15 ENCOUNTER — Encounter: Payer: Self-pay | Admitting: Otolaryngology

## 2015-03-25 ENCOUNTER — Telehealth: Payer: Self-pay | Admitting: Otolaryngology

## 2015-03-25 MED ORDER — DOXYCYCLINE MONOHYDRATE 100 MG PO CAPS *I*
100.0000 mg | ORAL_CAPSULE | Freq: Two times a day (BID) | ORAL | 0 refills | Status: AC
Start: 2015-03-25 — End: 2015-04-04

## 2015-03-25 NOTE — Telephone Encounter (Signed)
Left message on identified voicemail letting Shari Harvey know prescription has been sent to her pharmacy and went over administration instructions.  Advised Akiba to call our office with any questions or concerns.

## 2015-03-25 NOTE — Telephone Encounter (Signed)
Routing to Dr. Man for his consideration.       From office visit with Dr. Man on 03/07/15.      HPI  Shari Harvey is a 39 y.o. female who presents with 3 weeks of malaise, excessive mucus production, cough, and congestion. She feels she has a viral infection or an exacerbation of allergies as the mucus is clear.       Culture results are in

## 2015-03-25 NOTE — Telephone Encounter (Signed)
Spoke w/ patient. She saw Dr. Lowella DandyMan on 10/3. Culture taken showed infection at that time. Patient started feeling better and no meds were prescribed. She felt fine for ~11/2 wk. Now she is sick again, same symptoms. Can Dr Man prescribe antibiotic, or does pt need to be seen again? Please advise.    Call pt at [phone] (H)

## 2015-03-25 NOTE — Telephone Encounter (Signed)
Doxycycline x 10 days prescribed 

## 2015-07-04 ENCOUNTER — Telehealth: Payer: Self-pay | Admitting: Otolaryngology

## 2015-07-04 NOTE — Telephone Encounter (Signed)
Offer appointment either with Tyna Jaksch, NP (any time between 8 am and 3:30 pm) tomorrow Tuesday 07/06/15 or with me on Monday 07/11/15 at 2 pm.

## 2015-07-04 NOTE — Telephone Encounter (Signed)
Patient called and she has had a sinus infection now for 3-4 weeks. Your first appointment available is in March. Patient wonders if she could be seen sooner.

## 2015-07-05 ENCOUNTER — Encounter: Payer: Self-pay | Admitting: Otolaryngology

## 2015-07-05 ENCOUNTER — Ambulatory Visit: Payer: Self-pay | Admitting: Otolaryngology

## 2015-07-05 VITALS — BP 116/68 | HR 91 | Ht 68.0 in | Wt 225.0 lb

## 2015-07-05 DIAGNOSIS — J00 Acute nasopharyngitis [common cold]: Secondary | ICD-10-CM

## 2015-07-05 MED ORDER — BECLOMETHASONE DIPROPIONATE 80 MCG/ACT IN AERS *I*
1.0000 | INHALATION_SPRAY | Freq: Two times a day (BID) | RESPIRATORY_TRACT | 2 refills | Status: DC
Start: 2015-07-05 — End: 2017-09-16

## 2015-07-05 NOTE — Progress Notes (Signed)
Reason for Visit  "Sinus infection"    HPI  Shari Harvey is a 40 y.o. female who presents with concerns for a sinus infection. She was last seen in our office on March 07, 2015 with a presentation consistent with an acute URI/viral rhinitis. A culture was obtained at this time revealing 1+ corynebacterium species. She was still symptomatic 2 weeks later, and a 10 day course of doxycycline was prescribed on 03/25/2015. She reports symptoms completely resolved. She states she doesn't realize how bad she truly feels baseline until she's on doxycycline and notes a vast improvement. She has felt generally well in the interim until 2-3 weeks ago noting left greater than right nasal congestion, postnasal drainage, headache. She has a history of migraines and has needed to take Imitrex almost daily in the last 2-3 weeks which relieves her headache. She reports her allergies are not well-controlled. She usually takes Clarinex as needed, but reports having to take it daily for the past 2 weeks without improvement. She is taking Singulair every evening. She is on maintenance immunotherapy every other week with Dr. Roe Coombs, she was never able to reach "a good dose" with trees and grasses, she reports significant arm swelling. She has discontinued Flonase as it was irritating to her septum, she attributes her episode of epistaxis requiring cauterization in the ED to its use; it was helpful in alleviating her congestion.She has not tried an aerosolized intranasal spray nor has she every tried irrigating with a steroid in the rinse bottle.     To review, at her initial appointment in March 2016, she reported decreased energy and 60 pound weight gain over the past year. She reported being on 5 antibiotics as well as oral steroids between August 2015 and March 2016. She was on Augmentin in August, October, and December 2015. In early 2016, she was on Avelox x 7 days (did nothing) and then Ceftin and Biaxin in conjunction with 3  weeks of Medrol. She had allergy testing with Dr. Berlin Hun and was found to have sensitivities to dust mites, tree pollens, and rats ("very allergic ... had them as pets"). She had been previously allergic to dog and rabbit dander and had allergy immunotherapy as a child.     Allergies  She is allergic to shellfish-derived products; avelox [moxifloxacin]; environmental allergies; and flonase [fluticasone].    PMH  She has a past medical history of Anxiety; Asthma; Hypermobility syndrome; and Migraines.    PSH  She has a past surgical history that includes Tonsillectomy and adenoidectomy; Cesarean Section, Unspecified; and lumbar laminectomy. She had adenoidectomy in approximately 1982, 1992, and 1998. She had turbinate reduction and septoplasty in approximately 1998.    FH  Her family history includes Asthma in her sister; Cancer in her mother; Migraines in her sister and sister; Osteoarthritis in her father, sister, and sister; Thyroid disease in her sister and sister.    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 She  reports that she has quit smoking. She does not have any smokeless tobacco history on file. Her alcohol, drug, and sexual activity histories are not on file.    ROS  Positive nasal congestion, postnasal drainage, headache. Other systems negative.   PE  Vitals Blood pressure 116/68, pulse 91, height 1.727 m ( ), weight 102.1 kg (225 lb). Body mass index is 34.21 kg/(m^2).   General General appearance normal. Alert and cooperative. Voice normal.   Head/Face Normocephalic; atraumatic. No facial  skin lesions. Facial strength symmetric.   Eyes Extraocular muscles intact. No nystagmus with lateral gaze.   Ears, Nose, Mouth & Throat Clinical speech thresholds normal. External ears normal. EAC's normal, TM's intact, clear and mobile. Septum deviated to the right. Mucosa is edematous, mildly inflamed. Scant, clear drainage. No purulence.    Neck No masses. Thyroid  non-tender to palpation, no masses or thyromegaly.    Respiratory Normal respiratory effort. No retractions. Lungs CTA bilaterally.    Cardiovascular Peripheral vascular system normal.   Lymphatic No palpable cervical adenopathy.   Neuro/Psych CNII-XII grossly normal. Alert and oriented. Mood and affect normal.     Results   CT sinus (08/18/2014, images reviewed with patient on 08/18/2014) - Mild mucosal thickening in the maxillary and ethmoid sinuses. Minimal mucosal thickening in the frontal and sphenoid sinuses. Small left Haller cell.    Sinonasal culture (03/07/2015) - 1+ Corynebacterium species     Assessment  1. Acute URI/viral rhinitis.  2. Recurrent acute rhinosinusitis.  3. Allergic rhinitis.    Plan   Nasal endoscopy was performed to assess for drainage from the paranasal sinuses. The mucosa is mildly edematous, adequately hydrated. Right inferior turbinate slightly hypertrophic.There was no drainage in the middle meatus to culture today.    We reviewed her recorded exam and discussed the findings which are more consistent with a viral infection at this time. She may also benefit from tighter allergy control.    She attributes Flonase to prior episode of epistaxis.We discussed nasal saline spray and nasal saline gel prior to using an intranasal aerosol. Qvar with baby bottle nipple, 2 puffs in each nostril once a day was prescribed today. A sample of nasal saline gel was provided today.    A Neil Med sinus rinse bottle was provided today, she was encouraged to perform saline irrigations 1-2 per day.    Continue care with Dr. Berlin Hun. Continue Singulair, may continue Clarinex as needed if helpful.    She is traveling to [place] in two weeks, she may use Afrin during flight if needed. Educated regarding rebound congestion, use not to exceed 3 consecutive days.    Follow-up on an as-needed basis.    Antonietta Jewel T. Ahijah Devery, NP

## 2015-07-13 ENCOUNTER — Telehealth: Payer: Self-pay | Admitting: Otolaryngology

## 2015-07-13 MED ORDER — DOXYCYCLINE MONOHYDRATE 100 MG PO CAPS *I*
100.0000 mg | ORAL_CAPSULE | Freq: Two times a day (BID) | ORAL | 0 refills | Status: AC
Start: 2015-07-13 — End: 2015-07-23

## 2015-07-13 NOTE — Telephone Encounter (Signed)
Spoke with Shari Harvey, has been symptomatic for over 3 weeks. Will treat with doxycycline x 10 days. She will continue intranasal qvar and saline irrigations. She is exploring botox injections for migraines with neurologist. I have asked her to update me next week with her symptom response.

## 2015-07-13 NOTE — Telephone Encounter (Signed)
Patient called and stated she was here last Tuesday and she feels worse then before. Patient states her sinues are bad and she feels congested. In the morning she is coughing up thick yellow sputum and also when she blows her nose. She has been having sinus headaches and over patient stated she"feel really crappy". Patient believes she needs to be on an antibiotic. Please advise

## 2015-07-19 ENCOUNTER — Telehealth: Payer: Self-pay | Admitting: Otolaryngology

## 2015-07-19 ENCOUNTER — Encounter (INDEPENDENT_AMBULATORY_CARE_PROVIDER_SITE_OTHER): Payer: Self-pay | Admitting: Otolaryngology

## 2015-07-19 MED ORDER — PREDNISONE 10 MG PO TABS *I*
ORAL_TABLET | ORAL | 0 refills | Status: DC
Start: 2015-07-19 — End: 2016-01-05

## 2015-07-19 NOTE — Telephone Encounter (Signed)
LVM notifying patient I received her message and will respond via MyChart.

## 2015-07-19 NOTE — Telephone Encounter (Signed)
Shari Harvey called to let you know she is not feeling any better. She started antibiotic on Thursday. So she is half way through. Patient wondered if she is not feeling well after finishing her antibiotic that maybe we should broad spectrum antibiotic. Patient's number is [phone].

## 2015-07-19 NOTE — Telephone Encounter (Signed)
From: Edwin Wheeland  Sent: 07/19/2015 12:51 PM EST  To: Tyna Jaksch, NP  Subject: ZO:[id number]    Yes I'm tolerating the Qvar. I'm going today to get my allergy shot. I haven't gotten it in awhile bc I haven't felt well enough. But I'm wondering if I'm not feeling well bc I haven't gotten it. I'm much stuffier now than when you saw me. I can't smell or taste things now. I still have half the bottle of the doxy to go. We leave for [facility] tomorrow. I'll try the prednisone. And I guess we will go from from there. I'm hoping the [facility] weather and finishing off this antibiotic will do the trick.  ----- Message -----  From: Tyna Jaksch, NP   Sent: 07/19/2015 9:57 AM EST   To: Erlene Scotti  Subject: Medication     Hi Verlia,     I am sorry you aren't feeling any better. I chose doxycycline as your cultures have been sensitive to it in the past. Unfortunately (or fortunately!) there wasn't any drainage to culture on your last exam, I just reviewed it to refresh my memory and things look good. This could be viral, or possibly related to your allergies or migraine. I am happy to prescribe a course of prednisone which will calm down the inflammation. I am hesitant to try another course of antibiotics based upon your exam. Let me know what you think. Are you tolerating the Qvar ok?   Take care,   Antonietta Jewel

## 2015-07-21 ENCOUNTER — Telehealth: Payer: Self-pay | Admitting: Otolaryngology

## 2015-07-21 ENCOUNTER — Encounter: Payer: Self-pay | Admitting: Otolaryngology

## 2015-07-21 NOTE — Telephone Encounter (Signed)
Patient calling in regards to MyChart message that she sent to [facility] early this morning.  Patient states that it would be better to call her since she is on the road to [place].  Advised I would send message to [facility] for advisement.    Thank You

## 2015-07-21 NOTE — Telephone Encounter (Signed)
Discussed with patient that it is ok to stop prednisone, she took one dose on Tuesday morning. She is traveling by car to [place] and reports insomnia, nausea.

## 2015-12-28 ENCOUNTER — Telehealth: Payer: Self-pay | Admitting: Otolaryngology

## 2015-12-28 NOTE — Telephone Encounter (Signed)
I spoke with patient and confirmed appointment for 8/4 at 3:30PM with Letta Moynahan, NP. Patient said that she has been seeing PCP for chronic sinus issues and has been taking antibiotic but issues have not been resolved.

## 2015-12-28 NOTE — Telephone Encounter (Signed)
Patient is having issues with her sinuses and would like to schedule an appointment.    Patient would like a call back today at [phone]

## 2016-01-05 ENCOUNTER — Ambulatory Visit: Payer: Self-pay | Admitting: Otolaryngology

## 2016-01-05 ENCOUNTER — Encounter: Payer: Self-pay | Admitting: Otolaryngology

## 2016-01-05 VITALS — BP 121/75 | HR 80 | Ht 67.0 in | Wt 228.8 lb

## 2016-01-05 DIAGNOSIS — J309 Allergic rhinitis, unspecified: Secondary | ICD-10-CM

## 2016-01-05 DIAGNOSIS — Z8709 Personal history of other diseases of the respiratory system: Secondary | ICD-10-CM

## 2016-01-05 NOTE — Progress Notes (Signed)
Chief Complaint:   Recurrent Sinus Infections.     HPI:   Shari Harvey is a 40 y.o. female who is in seen in follow up for concerns for recurrent sinusitis.  She has a history of acute rhinitis, viral rhinitis, sinusitis. She was initially seen by Dr.Man in 08/2014. She had a CT Scan of the Sinuses on 08/18/14 which revealed mild mucosal thickening in the max and ethmoid sinuses. Minimal mucosal Thickening in the frontal and sphenoid sinuses. She had endoscopy with culture on 03/07/15 which revealed corynebacterium. Symptoms persisted and she was treated with a 10 day course of doxy with resolution of symptoms. She has a history of Migraines. She has a history of allergies and is on immunotherapy. She is on Clarinex, Q-Var and Singulair for there allergies as well. She also has a history of asthma and is on symbicort daily. She uses her rescue inhaler maybe 2 times per month.     She presents today with concerns of persistent infections. She reports that she was treated with Augmentin 2 weeks ago for symptoms of sinusitis. Symptoms have now resolved. Symptoms of infection were extreme fatigue, nasal congestion, peri-orbital pain, "yellow PND." She is now having infections every other month and is wondering if she should proceed with sinus surgery.  She is receiving immunotherapy every 2 weeks. There has been no decrease in frequency of infections since receiving injections. She guesses she has had 4-6 sinus infections per year. She is also pursuing bariatric surgery and trying to determine which surgery she should have first. She does not irrigate. She has multiple life stressors, she works full time and has 2 children with special needs.      Review of Systems: Pertinent positives and negatives listed in HPI    Patient's medications, allergies, past medical, surgical, social and family histories were reviewed and updated as appropriate.    Past Medical, Surgical and Family History:    Past Medical History:    Diagnosis Date    Anxiety     Asthma     Hypermobility syndrome     Migraines      Past Surgical History:   Procedure Laterality Date    CESAREAN SECTION, UNSPECIFIED      LUMBAR LAMINECTOMY      TONSILLECTOMY AND ADENOIDECTOMY       Family History   Problem Relation Age of Onset    Cancer Mother      glioblastoma    Osteoarthritis Father     Osteoarthritis Sister     Osteoarthritis Sister     Migraines Sister     Migraines Sister     Thyroid disease Sister     Thyroid disease Sister     Asthma Sister        Current Medications:  Current Outpatient Prescriptions   Medication    amoxicillin-clavulanate (AUGMENTIN) 875-125 MG per tablet    topiramate (TOPAMAX) 200 MG tablet    acyclovir (ZOVIRAX) 400 MG tablet    clonazePAM (KLONOPIN) 0.5 MG tablet    beclomethasone (QVAR) 80 MCG/ACT inhaler    buPROPion (WELLBUTRIN XL) 150 MG 24 hr tablet    montelukast (SINGULAIR) 10 MG tablet    ibuprofen (ADVIL,MOTRIN) 800 MG tablet    albuterol HFA 108 (90 BASE) MCG/ACT inhaler    SYMBICORT 160-4.5 MCG/ACT inhaler    SUMAtriptan (IMITREX) 100 MG tablet    desloratadine (CLARINEX) 5 MG tablet    sertraline (ZOLOFT) 50 MG tablet    oxyCODONE-acetaminophen (PERCOCET) 5-325  MG per tablet    acetaminophen (TYLENOL) 500 mg tablet    Multiple Vitamin (MULTI-VITAMIN PO)    RECTIV 0.4 % ointment    generic DME     No current facility-administered medications for this visit.        Allergies:   Shellfish-derived products; Avelox [moxifloxacin]; Environmental allergies; and Flonase [fluticasone]      Objective:     Visit Vitals    BP 121/75    Pulse 80    Ht 1.702 m (5\' 7" )    Wt 103.8 kg (228 lb 12.8 oz)    BMI 35.84 kg/m2     General:   healthy, alert, appears stated age, not in distress, no barriers to communication.   Head and Face:   no craniofacial deformities.   Ears:  Pre and post auricular regions benign. External ear normal, without lesions or rash. Ear canal patent bilaterally.  Tympanic  membranes, translucent,  intact and mobile with normal light reflexes and landmarks visible bilaterally.   Nose:    Nasal membranes mildly congested.  Septum midline without significant deviation.  No evidence of nasal mass or polyps.   Oral:   Lips without lesion. Buccal mucosa pink and moist. Tongue of normal size.  Dentition in adequate restoration. Uvula midline.  Oropharynx patent with previous removed tonsils.  Posterior pharyngeal wall without drainage or erythema.    Larynx:  Not Visualized   Neck:   No palpable cervical adenopathy.  Thyroid palpable and without mass or enlargement.  Normal crepitance of larynx.      Assessment/Plan:   Shari Harvey is a 40 y.o. female with history of sinusitis, allergic rhinitis and asthma. She is asymptomatic today and therefore endoscopy was not performed. She is interested in moving forward with this given the fact that she continues to struggle with 4-6 sinus infections per year despite allergy management. I reviewed basic details of ESS as well as expectations post-operatively. She has had a discussion about sinus surgery in the past with Dr. Man and ultimately I would defer a detailed discussion about a planned surgery to him. I will discuss her case with him and we will contact her thereafter to discuss. In the meantime I have encouraged her to continue with allergy management techniques (SCIT,Q-Var, Clarinex and Singulair) and have encouraged her to use nasal saline irrigation daily. She will call if she has concerns for infection and we will otherwise follow up with her via telephone call.     Shari Lints, NP as of 11:12 AM, 01/05/2016

## 2016-01-06 ENCOUNTER — Ambulatory Visit: Payer: Self-pay | Admitting: Otolaryngology

## 2016-02-16 ENCOUNTER — Encounter: Payer: Self-pay | Admitting: Gastroenterology

## 2016-02-21 ENCOUNTER — Encounter: Payer: Self-pay | Admitting: Gastroenterology

## 2016-02-21 ENCOUNTER — Encounter: Payer: Self-pay | Admitting: Surgery

## 2016-02-27 ENCOUNTER — Ambulatory Visit: Payer: PRIVATE HEALTH INSURANCE | Attending: Surgery | Admitting: Registered"

## 2016-02-27 DIAGNOSIS — E669 Obesity, unspecified: Secondary | ICD-10-CM

## 2016-02-27 NOTE — Progress Notes (Signed)
Edita Cherylann ParrCronise is a 40 y.o. y.o. female is here today for Initial Nutrition Visit regarding her Morbid Obesity.       Vitals 02/27/2016 01/05/2016 07/05/2015 03/07/2015 02/09/2015   Preop Weight 235 lb - - - -   BAR Weight (lbs) 235 - - - -   BAR Height (in) 67 - - - -   BMI (Calculated) 36.9 35.9 34.3 33.2 33.4   IBW in kg (Bariatric) 61.24 61.24 63.5 63.5 63.5   IBW in lbs (Bariatric) 135 135 140 140 140   Percent Excess Weight Loss 0 % -169.43 % -160.69 % -155.54 % -156.41 %   Weight Change for Visit (kg) 2.81 1.72 3.27 -0.54 -5.35   Weight Change Total (kg) 0 103.76 102.04 98.77 99.32   Initial Excess Weight 45.33 -61.24 - - -   Total Weight Loss Percent 0 % 2222 % 2222 % 2222 % 2222 %   BP - 121/75 116/68 118/77 133/85   Pulse - 80 91 70 77   Temp - - - - -        Jayden Grosser attended a 2 hour nutrition seminar which educated her on the basic principles of the bariatric lifestyle meal plan, the importance of exercise, and the modified eating behaviors necessary with bariatric surgery.  Participants in this seminar practice creating a sample meal plan with support provided by the Registered Dietitian. They then assess current eating habits based on the material just presented and set individual goals to work on prior to the next nutrition session with the R.D.  They are expected to document a daily food diary for the entire time while working with the dietitian.     Goals to work on until the next session with the RD are the following:     Decreasing portions of starch and or meat.  Eating at least 3 servings of non-starchy vegetables each day.  Eliminating carbonated beverages.  Practicing chewing food to an applesauce consistency before swallowing.      Follow up in 4 weeks    Time Spent Today with Patient:  120 minutes    Thank you for allowing me to participate in the care of Jeri Marks.  Please feel free to contact me directly at your convenience.    Gwenith DailySandra Calee Nugent, RD

## 2016-03-01 ENCOUNTER — Ambulatory Visit: Payer: PRIVATE HEALTH INSURANCE | Admitting: Surgery

## 2016-03-08 ENCOUNTER — Ambulatory Visit: Payer: PRIVATE HEALTH INSURANCE | Attending: Internal Medicine | Admitting: Surgery

## 2016-03-08 ENCOUNTER — Encounter: Payer: Self-pay | Admitting: Surgery

## 2016-03-08 DIAGNOSIS — E785 Hyperlipidemia, unspecified: Secondary | ICD-10-CM | POA: Insufficient documentation

## 2016-03-08 DIAGNOSIS — Z6836 Body mass index (BMI) 36.0-36.9, adult: Secondary | ICD-10-CM | POA: Insufficient documentation

## 2016-03-08 DIAGNOSIS — G4730 Sleep apnea, unspecified: Secondary | ICD-10-CM | POA: Insufficient documentation

## 2016-03-08 DIAGNOSIS — J45909 Unspecified asthma, uncomplicated: Secondary | ICD-10-CM

## 2016-03-08 DIAGNOSIS — K219 Gastro-esophageal reflux disease without esophagitis: Secondary | ICD-10-CM

## 2016-03-08 DIAGNOSIS — M1990 Unspecified osteoarthritis, unspecified site: Secondary | ICD-10-CM

## 2016-03-08 LAB — TSH: TSH: 1.78 u[IU]/mL (ref 0.27–4.20)

## 2016-03-08 NOTE — H&P (Signed)
[facility]  History and Physical    Name: Shari Harvey  MRN: [id number]  DOB: 06/02/1976  Date of Encounter: March 08, 2016  Medical Providers:  Referring: Demetra Shiner, MD  PCP: Demetra Shiner, MD      CC:  Morbid obesity with desire for weight loss    HPI:  Shari Harvey is a 40 y.o. female who presents at the request of Demetra Shiner, MD for bariatric surgical consultation.  Her body mass index is 36.81 kg/(m^2).Marland Kitchen The patient presents with obesity.  The severity is morbid.  The location is central and peripheral.  The duration has been greater than 5 years.  The quality has been stable.  She has failed medical treatment. She does have these associated symptoms obstructive sleep apnea, osteoarthritis, GERD, asthma, anxiety  .    Desired procedure: The patient is interested in Undecided       with Chrissie Noa E. Gershon Mussel, M.D., F.A.C.S.  .    Medical Diagnoses:  1. BMI 36.0-36.9,adult    2. Morbid obesity    3. Asthma, unspecified asthma severity, unspecified whether complicated, unspecified whether persistent    4. Gastroesophageal reflux disease, esophagitis presence not specified    5. Osteoarthritis, unspecified osteoarthritis type, unspecified site    6. Sleep apnea, unspecified type    7. Hyperlipidemia, unspecified hyperlipidemia type         PMH:  Past Medical History:   Diagnosis Date    Anxiety     Asthma     Complication of anesthesia     hypotension    Fatigue     GERD (gastroesophageal reflux disease)     Herniated lumbar disc without myelopathy     History of rectal abscess     Hyperlipidemia     not being treated    Hypermobility syndrome     Hypoglycemia     Insomnia     Irritable bowel syndrome     Migraines     Morbid obesity     Numbness and tingling     Osteoarthritis     Rash     under hanging skin    Shortness of breath     Sleep apnea     Stress incontinence     Supraventricular arrhythmia        Weight History:   [name] has been  obese:  Obesity History 02/21/2016   Obesity History Morbidly Obese >5 years;Obese since childhood;Obese since pregnancy        Weight loss programs/medications:   [name] has attempted the following weight loss programs and medications in the past:  No flowsheet data found.     Weight Loss Medications 02/21/2016   Weight Loss Medications Wellbutrin;Phentermine       Exercise History:   Excercise History 02/21/2016   Overweight At Least 5 Years Yes   Currently Excercising No   Functional Limits No Impairment (Can Walk 200 ft Without Assistance)        PSH:  Past Surgical History:   Procedure Laterality Date    ANNAL FISSURE      & Fistula septic    CESAREAN SECTION, CLASSIC  0454,0981    CESAREAN SECTION, UNSPECIFIED      LUMBAR LAMINECTOMY      PERIANAL      REMOVAL OF VENOUS THROMBOSIS    SPINE SURGERY      TONSILLECTOMY AND ADENOIDECTOMY          Current Medications, Vitamins,  and Supplements list:  Medications/Vitamins/Supplements         Last Dose Start Date End Date Provider     acetaminophen (TYLENOL) 500 mg tablet Taking  --  --  [provider]     Take 500 mg by mouth every 6 hours as needed for Pain     acyclovir (ZOVIRAX) 400 MG tablet Taking  05/31/15  --  [provider]     Take 400 mg by mouth 3 times daily as needed     albuterol HFA 108 (90 BASE) MCG/ACT inhaler Taking  --  --  [provider]     Inhale 2 puffs into the lungs every 6 hours as needed for Wheezing   Shake well before each use.     amoxicillin-clavulanate (AUGMENTIN) 875-125 MG per tablet Not Taking  12/12/15  --  [provider]     Take 1 tablet by mouth 2 times daily     beclomethasone (QVAR) 80 MCG/ACT inhaler Taking  07/05/15  --  Shults, Antonietta Jewel, NP     Inhale 1 puff into the lungs 2 times daily   Shake well before each use.     buPROPion (WELLBUTRIN XL) 150 MG 24 hr tablet Not Taking  02/15/15  --  [provider]     Take 150 mg by mouth daily     cholecalciferol (VITAMIN  D) 2000 UNITS tablet Taking  --  --  [provider]     Take 2,000 Units by mouth daily     clarithromycin (BIAXIN) 500 MG tablet   02/29/16  --  [provider]     take 1 tablet by mouth twice a day for 10 days     clonazePAM (KLONOPIN) 0.5 MG tablet Taking  06/13/15  --  [provider]     Take 0.5 mg by mouth 2 times daily as needed   for anxiety     desloratadine (CLARINEX) 5 MG tablet Taking  --  --  [provider]     Take 5 mg by mouth daily     diphenhydrAMINE (BENADRYL) 50 MG tablet Taking  --  --  [provider]     Take 50 mg by mouth nightly as needed for Itching     Docusate Sodium (COLACE PO) Taking  --  --  [provider]     Take by mouth     generic DME Not Taking  11/25/12  --  Hubert Azure, PA     Lateral unloader brace.     ibuprofen (ADVIL,MOTRIN) 800 MG tablet Taking  07/05/14  --  [provider]     Take 800 mg by mouth 3 times daily as needed    FOR PAIN TAKE WITH FOOD     Mag Aspart-Potassium Aspart 250-250 MG CAPS Taking  --  --  [provider]     Take by mouth     MELATONIN PO Taking  --  --  [provider]     Take by mouth     montelukast (SINGULAIR) 10 MG tablet Taking  03/04/15  --  [provider]     Take 10 mg by mouth daily     Multiple Vitamin (MULTI-VITAMIN PO) Taking  --  --  [provider]     Take by mouth     Multiple Vitamins-Minerals (MULTI COMPLETE PO) Not Taking  --  --  [provider]  Take by mouth     omeprazole (PRILOSEC) 20 MG capsule Taking  --  --  [provider]     Take 20 mg by mouth daily (before breakfast)     oxyCODONE-acetaminophen (PERCOCET) 5-325 MG per tablet Taking  --  --  [provider]     Take 1 tablet by mouth every 4-6 hours as needed for Pain        predniSONE (DELTASONE) 10 MG tablet Taking  --  --  [provider]     Take 10 mg by mouth daily     promethazine (PHENERGAN) 25 MG tablet  Taking  --  --  [provider]     Take 25 mg by mouth every 4-6 hours as needed for Nausea     RECTIV 0.4 % ointment Not Taking  10/18/15  --  [provider]          sertraline (ZOLOFT) 50 MG tablet Taking  --  --  [provider]     Take 100 mg by mouth daily        SUMAtriptan (IMITREX) 100 MG tablet Taking  --  --  [provider]     Take 100 mg by mouth as needed for Migraine   Take at onset of headache. May repeat once in 2 hours.     SYMBICORT 160-4.5 MCG/ACT inhaler Taking  06/14/14  --  [provider]     Inhale 2 puffs into the lungs 2 times daily     topiramate (TOPAMAX) 200 MG tablet Taking  09/28/15  --  [provider]               Allergies:  Allergies   Allergen Reactions    Shellfish-Derived Products      Created by Conversion - 0;     Avelox [Moxifloxacin] Other (See Comments)     Muscle soreness    Environmental Allergies Other (See Comments)     Seasonal    Flonase [Fluticasone] Other (See Comments)     epistaxis       Disability History:   Disability 02/21/2016   Is the Patient Disabled? No   Hearing Impaired? No       Family History:  Family History   Problem Relation Age of Onset    Cancer Mother      glioblastoma    Depression Mother     Early death Mother     Morbid Obesity Mother     Stroke Mother     Osteoarthritis Father     Asthma Father     Cancer Father     High cholesterol Father     Obesity Father     Asthma Sister     Cancer Sister     Osteoarthritis Sister     Osteoarthritis Sister     Migraines Sister     Migraines Sister     Thyroid disease Sister     Thyroid disease Sister     Asthma Sister         Social History:  Ms. Lashley currently resides in [place] [address].  Occupation:   Marital Status: Married  Children:   Support Person:     Tobacco History: She  reports that she quit smoking about 12 years ago. She has never used smokeless tobacco.  Alcohol History: Ms. Ashbaugh  reports that she does  not drink alcohol.  Ellicit drug use: She  reports that she  does not use illicit drugs.     Review of Systems:  Review of Systems   Constitutional: Positive for chills, diaphoresis and fever.        Current URI   HENT: Negative for hearing loss, nosebleeds and tinnitus.    Eyes: Negative for blurred vision and double vision.   Respiratory: Positive for cough, shortness of breath and wheezing.    Cardiovascular: Negative for chest pain, palpitations and leg swelling.   Gastrointestinal: Positive for heartburn. Negative for abdominal pain, blood in stool, constipation, diarrhea, nausea and vomiting.   Genitourinary: Negative for dysuria, frequency and urgency.   Musculoskeletal: Positive for back pain, joint pain and neck pain. Negative for myalgias.   Skin: Positive for rash. Negative for itching.   Neurological: Positive for headaches. Negative for tingling, tremors, seizures and loss of consciousness.   Psychiatric/Behavioral: Negative for depression and substance abuse. The patient is not nervous/anxious and does not have insomnia.        Physical Exam:  Blood pressure (!) 143/93, pulse 102, resp. rate 18, height 1.702 m (5\' 7" ), weight 106.6 kg (235 lb), SpO2 98 %.   Percent Excess Weight Loss: 0 Percent     Physical Exam   Constitutional: She is oriented to person, place, and time. She appears well-developed and well-nourished.   HENT:   Head: Normocephalic and atraumatic.   Eyes: Conjunctivae are normal. Pupils are equal, round, and reactive to light.   Neck: Normal range of motion. Neck supple. No tracheal deviation present. No thyromegaly present.   Cardiovascular: Normal rate, regular rhythm and normal heart sounds.    No murmur heard.  Pulmonary/Chest: Effort normal and breath sounds normal. No respiratory distress. She has no wheezes. She has no rales.   Abdominal: Soft. Bowel sounds are normal. She exhibits no distension and no mass. There is no tenderness. There is no rebound.   Low suprapubic surgical  scar   Musculoskeletal: Normal range of motion. She exhibits no edema.   Lymphadenopathy:     She has no cervical adenopathy.   Neurological: She is alert and oriented to person, place, and time.   Skin: Skin is warm and dry. No rash noted. No erythema.   Psychiatric: She has a normal mood and affect. Her behavior is normal. Thought content normal.        Assessment:  Shaniqua Eskew is a 40 y.o. female who presents for bariatric surgery.   Planned Procedure(s): Undecided.     Documentation Reviewed:  I have reviewed the required letter of support from the patients Primary Care Physician.    Prior Labs and Reports Reviewed  No results found.      Discussion:  After a thorough evaluation patient appears to be an appropriate candidate for weight loss surgery, pending results of requested testing and/or all requested support letters.  We had a brief discussion regarding the risks and benefits of weight loss surgery, answering all questions raised, and the patient voiced a clear understanding.  Further, more detailed discussion will take place during the consultation visit with the surgeon.     Plan:  Orders Placed This Encounter   Procedures    US abdominal complete    TSH    H. pylori antibody, IgG     Procedure: Undecided         She was instructed to discontinue NSAIDS as pain medication after surgery due to risk of ulceration of the gastric pouch.  It was explained that ulcers can perforate,  bleed, and/or cause strictures.  It was explained that Tylenol based medications are acceptable.  She was instructed to begin a daily multi vitamin regimen now and that she will be required to follow written instructions regarding a multi vitamin/supplementation regimen post surgery.  David Holston was instructed to bring her CPAP device with her on the day of surgery.    Return in about 2 weeks (around 03/22/2016).

## 2016-03-08 NOTE — Addendum Note (Signed)
Addended by: Tillie RungPRINGLE, Abimbola Aki D on: 03/08/2016 10:13 AM     Modules accepted: Orders

## 2016-03-09 LAB — H. PYLORI ANTIBODY, IGG: H Pylori IgG: NEGATIVE

## 2016-03-13 ENCOUNTER — Encounter: Payer: Self-pay | Admitting: Surgery

## 2016-03-28 ENCOUNTER — Encounter: Payer: Self-pay | Admitting: Surgery

## 2016-03-29 ENCOUNTER — Ambulatory Visit: Payer: PRIVATE HEALTH INSURANCE | Attending: Surgery | Admitting: Registered"

## 2016-03-29 DIAGNOSIS — E785 Hyperlipidemia, unspecified: Secondary | ICD-10-CM

## 2016-03-29 DIAGNOSIS — Z6836 Body mass index (BMI) 36.0-36.9, adult: Secondary | ICD-10-CM | POA: Insufficient documentation

## 2016-03-29 DIAGNOSIS — E669 Obesity, unspecified: Secondary | ICD-10-CM | POA: Insufficient documentation

## 2016-03-29 NOTE — Progress Notes (Signed)
Nutrition Visit    Shari Harvey is a 40 y.o. female is here today for a Medical Nutrition Visit regarding her Morbid Obesity.     Vitals 03/29/2016 03/08/2016 02/27/2016 01/05/2016 07/05/2015 03/07/2015 02/09/2015   Preop Weight 235 lb 235 lb 235 lb - - - -   Weight 233 lb 12.8 oz 235 lb 235 lb 228 lb 12.8 oz 225 lb 217 lb 12.8 oz 219 lb   BAR Weight (lbs) 233.8 235 235 - - - -   Height 5' 7.008" 5\' 7"  5\' 7"  5\' 7"  5\' 8"  5\' 8"  5\' 8"    BAR Height (in) 67.01 67 67 - - - -   BMI (Calculated) 36.7 kg/m2 36.9 kg/m2 36.9 kg/m2 35.9 kg/m2 34.3 kg/m2 33.2 kg/m2 33.4 kg/m2   BAR BMI (Calculated) 36.7 kg/m2 36.9 kg/m2 0 kg/m2 - - - -   IBW in kg (Bariatric) 61.25 61.24 61.24 61.24 63.5 63.5 63.5   IBW in lbs (Bariatric) 135.04 135 135 135 140 140 140   Percent Excess Weight Loss -1.19 % 0 % 0 % -169.43 % -160.69 % -155.54 % -156.41 %   Weight Change for Visit (kg) -0.54 0 2.81 1.72 3.27 -0.54 -5.35   Weight Change Total (kg) -0.54 0 0 103.76 102.04 98.77 99.32   Initial Excess Weight 45.32 45.33 45.33 -61.24 - - -   Total Weight Loss Percent -0.51 % 0 % 0 % 2222 % 2222 % 2222 % 2222 %     Vitals 03/08/2016 01/05/2016 07/05/2015 03/07/2015 02/09/2015   BP 143/93 121/75 116/68 118/77 133/85   Pulse 102 80 91 70 77   Resp 18 - - - -   Temp - - - - -       Desired procedure: The patient is interested in Undecided       with Shari Harvey, M.D., F.A.C.S.  .    Weight Loss Programs and Medications:   Shari Harvey has attempted the following weight loss programs and medications in the past:  Weight Watchers Duration (Months): 7 Months  Weight Watchers Loss Weight Lost: 9.072 kg (20 lb)  Weight Watchers MD Supervised: Yes  Weight Watchers Year: 2017 Year  Calorie Controlled Duration (Months): 24 Months  Calorie Controlled Loss Weight Lost: 13.6 kg (30 lb)  Calorie Controlled MD Supervised: Yes  Calorie Controlled Year: 2013 Year  Other Diet Name: whole 30  Other Duration (Months): 36 Months    Visit Plan:  2 months    Bariatric  Comorbidities:  She does have these associated symptoms obstructive sleep apnea, osteoarthritis, GERD, asthma, anxiety  .    Past Medical History:  Past Medical History:   Diagnosis Date    Anxiety     Asthma     Complication of anesthesia     hypotension    Fatigue     GERD (gastroesophageal reflux disease)     Herniated lumbar disc without myelopathy     History of rectal abscess     Hyperlipidemia     not being treated    Hypermobility syndrome     Hypoglycemia     Insomnia     Irritable bowel syndrome     Migraines     Morbid obesity     Numbness and tingling     Osteoarthritis     Rash     under hanging skin    Shortness of breath     Sleep apnea     Stress incontinence  Supraventricular arrhythmia        Social History:  Social History   Substance Use Topics    Smoking status: Former Smoker     Quit date: 06/05/2003    Smokeless tobacco: Never Used    Alcohol use No       Current Medications, Vitamins, and Supplements list:  Medications/Vitamins/Supplements         Last Dose Start Date End Date Provider     acetaminophen (TYLENOL) 500 mg tablet Taking  --  --  [provider]     Take 500 mg by mouth every 6 hours as needed for Pain     acyclovir (ZOVIRAX) 400 MG tablet Taking  05/31/15  --  [provider]     Take 400 mg by mouth 3 times daily as needed     albuterol HFA 108 (90 BASE) MCG/ACT inhaler Taking  --  --  [provider]     Inhale 2 puffs into the lungs every 6 hours as needed for Wheezing   Shake well before each use.     amoxicillin-clavulanate (AUGMENTIN) 875-125 MG per tablet Not Taking  12/12/15  --  [provider]     Take 1 tablet by mouth 2 times daily     beclomethasone (QVAR) 80 MCG/ACT inhaler Taking  07/05/15  --  Shults, Antonietta Jewel, NP     Inhale 1 puff into the lungs 2 times daily   Shake well before each use.     buPROPion (WELLBUTRIN XL) 150 MG 24 hr tablet Taking  02/15/15  --  [provider]     Take 150 mg by  mouth daily     calcium citrate-vitamin D (CALCIUM CITRATE + D) 315-250 MG-UNIT per tablet Taking  --  --  [provider]     Take 2 tablets by mouth 2 times daily     cholecalciferol (VITAMIN D) 2000 UNITS tablet Taking  --  --  [provider]     Take 2,000 Units by mouth daily     clarithromycin (BIAXIN) 500 MG tablet Not Taking  02/29/16  --  [provider]     take 1 tablet by mouth twice a day for 10 days     clonazePAM (KLONOPIN) 0.5 MG tablet Taking  06/13/15  --  [provider]     Take 0.5 mg by mouth 2 times daily as needed   for anxiety     desloratadine (CLARINEX) 5 MG tablet Taking  --  --  [provider]     Take 5 mg by mouth daily     diphenhydrAMINE (BENADRYL) 50 MG tablet Taking  --  --  [provider]     Take 50 mg by mouth nightly as needed for Itching     Docusate Sodium (COLACE PO) Taking  --  --  [provider]     Take by mouth     generic DME Taking  11/25/12  --  Hubert Azure, PA     Lateral unloader brace.     ibuprofen (ADVIL,MOTRIN) 800 MG tablet Taking  07/05/14  --  [provider]     Take 800 mg by mouth 3 times daily as needed    FOR PAIN TAKE WITH FOOD     Mag Aspart-Potassium Aspart 250-250 MG CAPS Taking  --  --  [provider]     Take by mouth     MELATONIN  PO Taking  --  --  [provider]     Take by mouth     montelukast (SINGULAIR) 10 MG tablet Taking  03/04/15  --  [provider]     Take 10 mg by mouth daily     Multiple Vitamin (MULTI-VITAMIN PO) Not Taking  --  --  [provider]     Take by mouth     Multiple Vitamins-Minerals (MULTI COMPLETE PO) Taking  --  --  [provider]     Take by mouth     omeprazole (PRILOSEC) 20 MG capsule Taking  --  --  [provider]     Take 20 mg by mouth daily (before breakfast)     oxyCODONE-acetaminophen (PERCOCET) 5-325 MG per tablet Taking  --  --  [provider]     Take 1  tablet by mouth every 4-6 hours as needed for Pain        predniSONE (DELTASONE) 10 MG tablet Not Taking  --  --  [provider]     Take 10 mg by mouth daily     promethazine (PHENERGAN) 25 MG tablet Taking  --  --  [provider]     Take 25 mg by mouth every 4-6 hours as needed for Nausea     RECTIV 0.4 % ointment Taking  10/18/15  --  [provider]          sertraline (ZOLOFT) 50 MG tablet Taking  --  --  [provider]     Take 100 mg by mouth daily        SUMAtriptan (IMITREX) 100 MG tablet Taking  --  --  [provider]     Take 100 mg by mouth as needed for Migraine   Take at onset of headache. May repeat once in 2 hours.     SYMBICORT 160-4.5 MCG/ACT inhaler Taking  06/14/14  --  [provider]     Inhale 2 puffs into the lungs 2 times daily     topiramate (TOPAMAX) 200 MG tablet Taking  09/28/15  --  [provider]               Allergies:  Allergies   Allergen Reactions    Shellfish-Derived Products      Created by Conversion - 0;     Avelox [Moxifloxacin] Other (See Comments)     Muscle soreness    Environmental Allergies Other (See Comments)     Seasonal    Flonase [Fluticasone] Other (See Comments)     epistaxis              Nutrition History:   Employment Works as a Oceanographer -9 am-5 pm.    Cooking by [employer] Self and 1 to 3 children    Eating Pattern stress eating.    Contributing Factors Starches and High Fat Food (comfort eating due to health issues)    Eating Behaviors Chewing Foods Well and In Process    Beverages Water, Omitted Carbonation and Drinking Appropriate Beverages    Current Activities ADL's Only    Goal Activities Cardio    Goal Activity Duration 1 to 3 days, less than 15 minutes a day    Current Intake Eats three meals, High in fat, High in starch, Inadequate vegetables and Inadequate fruit    Education Reviewed standard portion sizes, Reviewed healthy  food choices and snacks,  Discussed emotional eating behaviors and strategies and Discussed benefits of exercise    Plan/Goals Scheduled for a follow up appointment, Increase vegetables, Measure foods, Plan for meals, Increase fruit, Decrease fat intake and Record a food journal         Completed Food Journal:  yes    Time Spent Today with Patient:  30 minutes    Interpreter Used During Session:  No    Patient Comprehension of Plan/Goals:  Good     Patient Motivation:  Fair     Thank you for allowing me to participate in the care of Shari Harvey. Please feel free to contact me directly at your convenience.    Gwenith Daily, RD

## 2016-04-24 ENCOUNTER — Encounter: Payer: Self-pay | Admitting: Surgery

## 2016-04-25 ENCOUNTER — Ambulatory Visit: Payer: PRIVATE HEALTH INSURANCE | Attending: Surgery | Admitting: Registered"

## 2016-04-25 DIAGNOSIS — E669 Obesity, unspecified: Secondary | ICD-10-CM

## 2016-04-25 DIAGNOSIS — Z6836 Body mass index (BMI) 36.0-36.9, adult: Secondary | ICD-10-CM | POA: Insufficient documentation

## 2016-04-25 NOTE — Progress Notes (Signed)
Nutrition Visit    Shari Harvey is a 40 y.o. female is here today for a Medical Nutrition Visit regarding her Morbid Obesity.     Vitals 04/25/2016 03/29/2016 03/08/2016 02/27/2016 01/05/2016 07/05/2015 03/07/2015   Preop Weight 235 lb 235 lb 235 lb 235 lb - - -   Weight 233 lb 3.2 oz 233 lb 12.8 oz 235 lb 235 lb 228 lb 12.8 oz 225 lb 217 lb 12.8 oz   BAR Weight (lbs) 233.2 233.8 235 235 - - -   Height 5' 7.008" 5' 7.008" 5' 7" 5' 7" 5' 7" 5' 8" 5' 8"   BAR Height (in) 67.01 67.01 67 67 - - -   BMI (Calculated) 36.6 kg/m2 36.7 kg/m2 36.9 kg/m2 36.9 kg/m2 35.9 kg/m2 34.3 kg/m2 33.2 kg/m2   BAR BMI (Calculated) 36.6 kg/m2 36.7 kg/m2 36.9 kg/m2 0 kg/m2 - - -   IBW in kg (Bariatric) 61.25 61.25 61.24 61.24 61.24 63.5 63.5   IBW in lbs (Bariatric) 135.04 135.04 135 135 135 140 140   Percent Excess Weight Loss -1.81 % -1.19 % 0 % 0 % -169.43 % -160.69 % -155.54 %   Weight Change for Visit (kg) -0.27 -0.54 0 2.81 1.72 3.27 -0.54   Weight Change Total (kg) -0.82 -0.54 0 0 103.76 102.04 98.77   Initial Excess Weight 45.32 45.32 45.33 45.33 -61.24 - -   Total Weight Loss Percent -0.77 % -0.51 % 0 % 0 % 2222 % 2222 % 2222 %     Vitals 03/08/2016 01/05/2016 07/05/2015 03/07/2015 02/09/2015   BP 143/93 121/75 116/68 118/77 133/85   Pulse 102 80 91 70 77   Resp 18 - - - -   Temp - - - - -       Desired procedure: The patient is interested in Undecided       with Gwyndolyn Saxon E. Hilda Blades, M.D., F.A.C.S.  .    Weight Loss Programs and Medications:   Ms. Blasco has attempted the following weight loss programs and medications in the past:  Weight Watchers Duration (Months): 7 Months  Weight Watchers Loss Weight Lost: 9.072 kg (20 lb)  Weight Watchers MD Supervised: Yes  Weight Watchers Year: 2017 Year  Calorie Controlled Duration (Months): 24 Months  Calorie Controlled Loss Weight Lost: 13.6 kg (30 lb)  Calorie Controlled MD Supervised: Yes  Calorie Controlled Year: 2013 Year  Other Diet Name: whole 30  Other Duration (Months): 36 Months    Visit  Plan:  2 months    Bariatric Comorbidities:  She does have these associated symptoms obstructive sleep apnea, osteoarthritis, GERD, asthma, anxiety  .    Past Medical History:  Past Medical History:   Diagnosis Date    Anxiety     Asthma     Complication of anesthesia     hypotension    Fatigue     GERD (gastroesophageal reflux disease)     Herniated lumbar disc without myelopathy     History of rectal abscess     Hyperlipidemia     not being treated    Hypermobility syndrome     Hypoglycemia     Insomnia     Irritable bowel syndrome     Migraines     Morbid obesity     Numbness and tingling     Osteoarthritis     Rash     under hanging skin    Shortness of breath     Sleep apnea     Stress  incontinence     Supraventricular arrhythmia        Social History:  Social History   Substance Use Topics    Smoking status: Former Smoker     Quit date: 06/05/2003    Smokeless tobacco: Never Used    Alcohol use No       Current Medications, Vitamins, and Supplements list:  Medications/Vitamins/Supplements         Last Dose Start Date End Date Provider     acetaminophen (TYLENOL) 500 mg tablet Taking  --  --  [provider]     Take 500 mg by mouth every 6 hours as needed for Pain     acyclovir (ZOVIRAX) 400 MG tablet Taking  05/31/15  --  [provider]     Take 400 mg by mouth 3 times daily as needed     albuterol HFA 108 (90 BASE) MCG/ACT inhaler Taking  --  --  [provider]     Inhale 2 puffs into the lungs every 6 hours as needed for Wheezing   Shake well before each use.     amoxicillin-clavulanate (AUGMENTIN) 875-125 MG per tablet Not Taking  12/12/15  --  [provider]     Take 1 tablet by mouth 2 times daily     beclomethasone (QVAR) 80 MCG/ACT inhaler Taking  07/05/15  --  Shults, Lisabeth Devoid, NP     Inhale 1 puff into the lungs 2 times daily   Shake well before each use.     buPROPion (WELLBUTRIN XL) 150 MG 24 hr tablet Taking  02/15/15  --  [provider]     Take 150 mg by mouth daily     calcium citrate-vitamin D (CALCIUM CITRATE + D) 315-250 MG-UNIT per tablet Taking  --  --  [provider]     Take 2 tablets by mouth 2 times daily     cholecalciferol (VITAMIN D) 2000 UNITS tablet Taking  --  --  [provider]     Take 2,000 Units by mouth daily     clarithromycin (BIAXIN) 500 MG tablet Taking  02/29/16  --  [provider]     take 1 tablet by mouth twice a day for 10 days     clonazePAM (KLONOPIN) 0.5 MG tablet Taking  06/13/15  --  [provider]     Take 0.5 mg by mouth 2 times daily as needed   for anxiety     desloratadine (CLARINEX) 5 MG tablet Taking  --  --  [provider]     Take 5 mg by mouth daily     diphenhydrAMINE (BENADRYL) 50 MG tablet Taking  --  --  [provider]     Take 50 mg by mouth nightly as needed for Itching     Docusate Sodium (COLACE PO) Taking  --  --  [provider]     Take by mouth     generic DME Taking  11/25/12  --  Sanda Klein, PA     Lateral unloader brace.     ibuprofen (ADVIL,MOTRIN) 800 MG tablet Taking  07/05/14  --  [provider]     Take 800 mg by mouth 3 times daily as needed    FOR PAIN TAKE WITH FOOD     Mag Aspart-Potassium Aspart 250-250 MG CAPS Taking  --  --  [provider]     Take by mouth  MELATONIN PO Taking  --  --  [provider]     Take by mouth     montelukast (SINGULAIR) 10 MG tablet Taking  03/04/15  --  [provider]     Take 10 mg by mouth daily     Multiple Vitamin (MULTI-VITAMIN PO) Not Taking  --  --  [provider]     Take by mouth     Multiple Vitamins-Minerals (MULTI COMPLETE PO) Taking  --  --  [provider]     Take by mouth     omeprazole (PRILOSEC) 20 MG capsule Taking  --  --  [provider]     Take 20 mg by mouth daily (before breakfast)     oxyCODONE-acetaminophen (PERCOCET) 5-325 MG per tablet Taking  --  --   [provider]     Take 1 tablet by mouth every 4-6 hours as needed for Pain        predniSONE (DELTASONE) 10 MG tablet Not Taking  --  --  [provider]     Take 10 mg by mouth daily     promethazine (PHENERGAN) 25 MG tablet Taking  --  --  [provider]     Take 25 mg by mouth every 4-6 hours as needed for Nausea     RECTIV 0.4 % ointment Taking  10/18/15  --  [provider]          sertraline (ZOLOFT) 50 MG tablet Taking  --  --  [provider]     Take 100 mg by mouth daily        SUMAtriptan (IMITREX) 100 MG tablet Taking  --  --  [provider]     Take 100 mg by mouth as needed for Migraine   Take at onset of headache. May repeat once in 2 hours.     SYMBICORT 160-4.5 MCG/ACT inhaler Taking  06/14/14  --  [provider]     Inhale 2 puffs into the lungs 2 times daily     topiramate (TOPAMAX) 200 MG tablet Taking  09/28/15  --  [provider]               Allergies:  Allergies   Allergen Reactions    Shellfish-Derived Products      Created by Conversion - 0;     Avelox [Moxifloxacin] Other (See Comments)     Muscle soreness    Environmental Allergies Other (See Comments)     Seasonal    Flonase [Fluticasone] Other (See Comments)     epistaxis              Nutrition History:   Employment Works as a Hotel manager -9 am-5 pm.    Cooking by [employer] Self and 1 to 3 children    Eating Pattern Denies stress eating.    Contributing Factors Starches and High Fat Food (comfort eating due to health issues)    Eating Behaviors Chewing Foods Well, Not Drinking with Meals, Extending Meal Time >30 min, Sipping Fluids and All Goals Met    Beverages Water, Omitted Carbonation and Drinking Appropriate Beverages    Current Activities Walking    Current Activity Duration 1 to 3 days, less than 15 minutes a day    Goal Activities Walking    Goal Activity Duration 1 to 3 days, less than 30 minutes a day  Current  Intake Demonstrates ability to plan and prepare bariatric meal plan and Balanced meals    Education Instructed patient to continue with lifestyle meal plan until surgery    Plan/Goals Cleared for surgery, Educated on liquid meal plan and Purchase protein supplement prior to surgery         Completed Food Journal:  yes    Time Spent Today with Patient:  30 minutes    Interpreter Used During Session:  No    Patient Comprehension of Plan/Goals:  Good     Patient Motivation:  Good     Thank you for allowing me to participate in the care of Shari Harvey. Please feel free to contact me directly at your convenience.    Wynona Dove, RD

## 2016-05-25 ENCOUNTER — Ambulatory Visit
Admission: RE | Admit: 2016-05-25 | Discharge: 2016-05-25 | Disposition: A | Discharge status: Home / Self Care | Payer: PRIVATE HEALTH INSURANCE | Source: Ambulatory Visit | Attending: Surgery | Admitting: Surgery

## 2016-05-31 ENCOUNTER — Encounter: Payer: Self-pay | Admitting: Surgery

## 2016-07-26 ENCOUNTER — Encounter: Payer: Self-pay | Admitting: Registered"

## 2016-07-31 ENCOUNTER — Encounter: Payer: Self-pay | Admitting: Registered"

## 2016-08-02 HISTORY — PX: GASTRECTOMY: SHX58

## 2016-08-06 ENCOUNTER — Encounter: Payer: Self-pay | Admitting: Surgery

## 2016-08-17 ENCOUNTER — Encounter: Payer: Self-pay | Admitting: Surgery

## 2016-08-17 ENCOUNTER — Ambulatory Visit: Payer: PRIVATE HEALTH INSURANCE | Attending: Surgery | Admitting: Surgery

## 2016-08-17 VITALS — BP 132/72 | HR 89 | Temp 96.4°F | Resp 18 | Ht 67.01 in | Wt 228.6 lb

## 2016-08-17 DIAGNOSIS — Z6835 Body mass index (BMI) 35.0-35.9, adult: Secondary | ICD-10-CM

## 2016-08-17 DIAGNOSIS — G4730 Sleep apnea, unspecified: Secondary | ICD-10-CM

## 2016-08-17 NOTE — H&P (Signed)
[facility] at [facility]  Surgeon's History and Physical    Name: Shari Harvey  MRN: [id number]  DOB: Nov 02, 1975  Date of Encounter: August 17, 2016  Medical Providers:  Referring: No ref. provider found  PCP: Shari Shiner, MD    CC:  Morbid obesity with desire for weight loss surgery.    HPI:  Shari Harvey is a 41 y.o. female who presents at the request of Shari Shiner, MD for bariatric surgical consultation.  Her body mass index is 35.8 kg/(m^2).Marland Kitchen The patient presents with obesity.  The severity is morbid.  The location is central and peripheral.  The duration has been greater than 5 years.  The quality has been stable.  She has failed medical treatment. She does have these associated symptoms obstructive sleep apnea, osteoarthritis, GERD, asthma, anxiety  .    Desired procedure: The patient is interested in Laparoscopic Sleeve Gastrectomy       with Shari Harvey. Shari Harvey.  .    Weight History:   Shari Harvey has been obese:  Obesity History 02/21/2016   Obesity History Morbidly Obese >5 years;Obese since childhood;Obese since pregnancy       PMH:  Past Medical History:   Diagnosis Date    Anxiety     Asthma     Complication of anesthesia     hypotension    Fatigue     GERD (gastroesophageal reflux disease)     Herniated lumbar disc without myelopathy     History of rectal abscess     Hyperlipidemia     not being treated    Hypermobility syndrome     Hypoglycemia     Insomnia     Irritable bowel syndrome     Migraines     Morbid obesity     Numbness and tingling     Osteoarthritis     Rash     under hanging skin    Shortness of breath     Sleep apnea     Stress incontinence     Supraventricular arrhythmia         PSH:  Past Surgical History:   Procedure Laterality Date    ANNAL FISSURE      & Fistula septic    CESAREAN SECTION, CLASSIC  0454,0981    CESAREAN SECTION, UNSPECIFIED      LUMBAR LAMINECTOMY      PERIANAL      REMOVAL OF VENOUS THROMBOSIS    SPINE  SURGERY      TONSILLECTOMY AND ADENOIDECTOMY          Current Medications, Vitamins, and Supplements list:  Current Outpatient Prescriptions   Medication    calcium citrate-vitamin D (CALCIUM CITRATE + D) 315-250 MG-UNIT per tablet    cholecalciferol (VITAMIN D) 2000 UNITS tablet    Mag Aspart-Potassium Aspart 250-250 MG CAPS    Multiple Vitamins-Minerals (MULTI COMPLETE PO)    MELATONIN PO    Docusate Sodium (COLACE PO)    promethazine (PHENERGAN) 25 MG tablet    diphenhydrAMINE (BENADRYL) 50 MG tablet    omeprazole (PRILOSEC) 20 MG capsule    predniSONE (DELTASONE) 10 MG tablet    topiramate (TOPAMAX) 200 MG tablet    clonazePAM (KLONOPIN) 0.5 MG tablet    beclomethasone (QVAR) 80 MCG/ACT inhaler    montelukast (SINGULAIR) 10 MG tablet    albuterol HFA 108 (90 BASE) MCG/ACT inhaler    SYMBICORT 160-4.5 MCG/ACT inhaler    SUMAtriptan (IMITREX) 100 MG tablet  desloratadine (CLARINEX) 5 MG tablet    sertraline (ZOLOFT) 50 MG tablet    oxyCODONE-acetaminophen (PERCOCET) 5-325 MG per tablet    acetaminophen (TYLENOL) 500 mg tablet    Multiple Vitamin (MULTI-VITAMIN PO)    clarithromycin (BIAXIN) 500 MG tablet    amoxicillin-clavulanate (AUGMENTIN) 875-125 MG per tablet    RECTIV 0.4 % ointment    acyclovir (ZOVIRAX) 400 MG tablet    buPROPion (WELLBUTRIN XL) 150 MG 24 hr tablet    ibuprofen (ADVIL,MOTRIN) 800 MG tablet    generic DME     No current facility-administered medications for this visit.        Allergies:  Allergies   Allergen Reactions    Shellfish-Derived Products      Created by Conversion - 0;     Avelox [Moxifloxacin] Other (See Comments)     Muscle soreness    Environmental Allergies Other (See Comments)     Seasonal    Flonase [Fluticasone] Other (See Comments)     epistaxis       Disability History:   Disability 02/21/2016   Is the Patient Disabled? No   Hearing Impaired? No       Family History:  Family History   Problem Relation Age of Onset    Cancer Mother       glioblastoma    Depression Mother     Early death Mother     Morbid Obesity Mother     Stroke Mother     Osteoarthritis Father     Asthma Father     Cancer Father     High cholesterol Father     Obesity Father     Asthma Sister     Cancer Sister     Osteoarthritis Sister     Osteoarthritis Sister     Migraines Sister     Migraines Sister     Thyroid disease Sister     Thyroid disease Sister     Asthma Sister         Social History:  Shari Harvey currently resides in [place] [address].  Occupation:   Marital Status: Married  Children:   Support Person:     Tobacco History: She  reports that she quit smoking about 13 years ago. She has never used smokeless tobacco.  Alcohol History: [name]  reports that she does not drink alcohol.  Ellicit drug use: She  reports that she does not use illicit drugs.    Review of Systems:  Review of Systems   Constitutional: Negative.    HENT: Negative.    Eyes: Negative.    Respiratory: Negative.    Cardiovascular: Negative.    Gastrointestinal: Positive for constipation and heartburn. Negative for abdominal pain, blood in stool, diarrhea, nausea and vomiting.        Rare GERD with prn PPI   Genitourinary: Negative.    Musculoskeletal: Positive for back pain, joint pain and myalgias.   Skin: Negative.    Neurological: Positive for headaches.        Migrianes   Endo/Heme/Allergies: Positive for environmental allergies.   Psychiatric/Behavioral: Negative.           Physical Exam:  Blood pressure 132/72, pulse 89, temperature 35.8 C (96.4 F), resp. rate 18, height 1.702 m (5' 7.01"), weight 103.7 kg (228 lb 9.6 oz), SpO2 98 %.   Percent Excess Weight Loss: -6.4 Percent     Physical Exam   Vitals reviewed.  Constitutional: She is oriented to person,  place, and time. She appears well-developed and well-nourished. No distress.   HENT:   Head: Normocephalic.   Eyes: No scleral icterus.   Neck: Normal range of motion. Neck supple.   Cardiovascular: Normal rate, regular  rhythm and normal heart sounds.    No murmur heard.  Pulmonary/Chest: Effort normal and breath sounds normal. No respiratory distress. She has no wheezes. She has no rales.   Abdominal: Soft. Bowel sounds are normal. She exhibits no distension and no mass. There is no tenderness.   Musculoskeletal: She exhibits no edema or tenderness.   Neurological: She is alert and oriented to person, place, and time.   Skin: Skin is warm and dry.   Psychiatric: She has a normal mood and affect. Her behavior is normal. Judgment and thought content normal.        Assessment:  Shari Harvey is a 41 y.o. female who presents for bariatric surgery.    After a thorough evaluation I believe that Shari Harvey is an appropriate candidate for weight loss surgery due to her morbid obesity and multiple associated comorbid conditions: obstructive sleep apnea, osteoarthritis, GERD, asthma, anxiety  .    Documentation Reviewed  I have reviewed the required letter of support from the patients Primary Care Physician.  I verified that the patient underwent the required psychological evaluation and was deemed to be a good candidate for bariatric surgery.   I have personally reviewed each Registered Dietitian(who are under my supervision) note and agree with the nutritional assessment of the Dietitian that this patient has satisfied all pre-surgery requirements including nutrition education on the bariatric lifestyle meal plan, an exercise regimen and behavior modification techniques.  I attest that this patient has completed the multi-disciplinary surgical preparatory regimen in order to improve surgical outcomes, reduce the potential for surgical complications, and establish the patients ability to comply with post-operative medical care and dietary restrictions.    Prior Tests and Reports Reviewed  Lab Results   Component Value Date    HEL NEG 03/08/2016      Lab Results   Component Value Date    TSH 1.78 03/08/2016     No results found for:  NICOT, COTIN, 3OHCT   No results found.  Results for orders placed in visit on 03/08/16   US abdominal complete    Impression IMPRESSION:  1.  No cholelithiasis or sonographic evidence of acute cholecystitis.     2.  Hepatomegaly. Slightly echogenic liver which can be seen with   fatty infiltration.     END OF REPORT     [facility] submits this DICOM format image data and final report to   the [facility], an independent secure electronic health   information exchange, on a reciprocally searchable basis (with   patient authorization) for a minimum of 12 months after exam date.       All pertinent outside tests, labs, or preop clearances reviewed.      Plan:  No orders placed this visit      Planned Procedure: Laparoscopic Sleeve Gastrectomy             Pre-operative Counseling:           I have personally discussed in appropriate language the potential  complications of the laparoscopic sleeve gastrectomy with Shari Harvey.  She is well aware of the serious complications of the surgery that  include but not limited to bleeding, deep venous thrombosis, pulmonary  embolism, pulmonary complications such as pneumonia, cardiac events,  and stroke, damage to the spleen or other organs due to laparoscopic  approach and/or due to lysis of adhesions, bowel injury. she is also  aware of specific complications which apply in particular to this procedure  and can include but are not limited to severe post-operative nausea,  dysphagia, the leakage of gastric contents at the staple line and the  development of an intra-abdominal abscess requiring drainage, need for  additional surgery, severe intra-abdominal infection leading to sepsis and  even death, wound complications such as hernias and wound infections,  strictures, small bowel obstruction, disfiguring scars and failure to lose  weight. She understands that if, at the time of surgery,  laparoscopy is not possible or feasible then the procedure may need to be  converted  to an open procedure. She was informed that the  mortality rate following this surgery is considered to be approximately 0.1  % nationally.  We have described the fact that following surgical weight loss, there can  be significant hanging skin, which may require plastic surgical  intervention, which may or may not be covered by insurance as well. After  the above discussion, she asked appropriate questions, which were  answered to her satisfaction. She voiced a clear  understanding that surgery; like dieting, exercise, behavior modification,  medications, or a combination thereof, is not a cure for obesity, but a  treatment option and that any weight loss as a result of the surgery can be  regained if a patient is not compliant with a lifestyle that promotes healthy  weight loss and weight loss maintenance. She voiced a clear  understanding of the importance of exercise as an adjunct to a healthy  diet in order to achieve maximum health and weight loss benefits.  She understands that surgery is a tool and that weight loss is not  guaranteed but only seen in the context of appropriate use, regular follow  up and exercise. We discussed that we encounter approximately 50-60  percent excess weight loss on average following the sleeve gastrectomy.  She appears to have reasonable expectations of the outcome of  the surgery and wishes to go forward with the procedure.  The potential for increased risk for complications when combining a hernia repair with a primary bariatric procedure were discussed in detail, including but not limited to bleeding, infection, recurrence of the hernia, injury to bowel or other structures which may require subsequent surgeries or other procedures to treat.  She was instructed to discontinue NSAIDS as pain medication after surgery due to risk of ulceration of the gastric pouch. It was explained that ulcers can perforate, bleed, and/or cause strictures. It was explained that Tylenol based  medications are acceptable.  She was instructed to bring her CPAP device with her on the day of surgery.  She was instructed to begin a daily multivitamin regimen now and that she will be required to follow written instructions regarding a multi vitamin/mineral/protein supplementation regimen post-surgery.  Shari Harvey voiced a clear understanding that surgery is a tool and that weight loss is not guaranteed but only seen in the context of appropriate use, follow up, and exercise.  Shari Harvey was counselled regarding the following:  Patient was advised that standard pre-operative tests will be ordered prior to surgery and if any are found to be abnormal, he may need further testing, which may delay his surgery.  Patient was instructed to discontinue NSAIDS as pain medication after surgery due to risk of ulceration of the gastric pouch.  It was explained that ulcers can perforate, bleed, and/or cause strictures.  It was explained that Tylenol based medications are acceptable.  The patient was instructed to begin a daily multivitamin regimen now and that she will be required to follow written instructions regarding a multivitamin/supplementation regimen post surgery.

## 2016-08-24 ENCOUNTER — Other Ambulatory Visit: Payer: Self-pay | Admitting: Cardiology

## 2016-08-24 ENCOUNTER — Ambulatory Visit
Admission: RE | Admit: 2016-08-24 | Discharge: 2016-08-24 | Disposition: A | Discharge status: Home / Self Care | Payer: PRIVATE HEALTH INSURANCE | Source: Ambulatory Visit | Attending: Surgery | Admitting: Surgery

## 2016-08-24 DIAGNOSIS — I499 Cardiac arrhythmia, unspecified: Secondary | ICD-10-CM

## 2016-08-24 DIAGNOSIS — Z01810 Encounter for preprocedural cardiovascular examination: Secondary | ICD-10-CM

## 2016-08-24 DIAGNOSIS — Z01818 Encounter for other preprocedural examination: Secondary | ICD-10-CM | POA: Insufficient documentation

## 2016-08-24 HISTORY — DX: Obstructive sleep apnea (adult) (pediatric): G47.33

## 2016-08-24 LAB — COMPREHENSIVE METABOLIC PANEL
ALT: 12 U/L (ref 0–35)
AST: 16 U/L (ref 0–35)
Albumin: 4.2 g/dL (ref 3.5–5.2)
Alk Phos: 88 U/L (ref 35–105)
Anion Gap: 11 (ref 7–16)
Bilirubin,Total: <0.2 mg/dL (ref 0.0–1.2)
CO2: 23 mmol/L (ref 20–28)
Calcium: 9.5 mg/dL (ref 8.8–10.2)
Chloride: 107 mmol/L (ref 96–108)
Creatinine: 0.84 mg/dL (ref 0.51–0.95)
GFR,Black: 100 *
GFR,Caucasian: 87 *
Globulin: 2.6 g/dL — ABNORMAL LOW (ref 2.7–4.3)
Glucose: 92 mg/dL (ref 60–99)
Lab: 25 mg/dL — ABNORMAL HIGH (ref 6–20)
Potassium: 4 mmol/L (ref 3.3–5.1)
Sodium: 141 mmol/L (ref 133–145)
Total Protein: 6.8 g/dL (ref 6.3–7.7)

## 2016-08-24 LAB — MCHC: MCHC: 34 g/dL (ref 32–36)

## 2016-08-24 LAB — TYPE AND SCREEN
ABO RH Blood Type: O POS
Antibody Screen: NEGATIVE

## 2016-08-24 LAB — HEMATOCRIT: Hematocrit: 43 % (ref 34–45)

## 2016-08-24 NOTE — Discharge Instructions (Addendum)
Pre-Operative Instructions - Gastric Bypass / Gastrectomy Sleeve                            PRIOR TO SURGERY     Five days before surgery, please STOP taking if surgeon does not specify:     Anti-inflammatory medications (Ibuprofen, Motrin, Advil, Mobic, Meloxicam, Aleve, Naproxen, Voltaren, etc.)   Vitamins and herbal supplements, including herbal teas    YOU MAY TAKE ACETAMINOPHEN (TYLENOL) as needed    FOLLOW YOUR SURGEON'S INSTRUCTIONS IF DIFFERENT THAN ABOVE.    DAY BEFORE SURGERY     Call [phone] between 2PM and 4PM to receive your arrival/surgery time.     Call Friday afternoon if your surgery is on Monday.      On waking, start a CLEAR liquid diet (broth, tea, coffee, juice, jello, water). You may remain on a clear liquid diet until 12:00 midnight.      Do not eat or drink anything (including candy or gum) after midnight the night before your surgery.    DAY OF SURGERY     If you are female, please be prepared to provide a urine sample on arrival to the preoperative area unless you are one year post menopause or have had a hysterectomy.       DO NOT WEAR: JEWELRY, MAKEUP, DARK NAIL POLISH, HAIR PINS, BODY LOTION OR SCENTS.        Please understand that rings and body piercings must be removed prior to surgery.     If they are not removed your surgery is at risk of being delayed or cancelled. Please see a jeweler before your surgery if you cannot remove an item yourself.    If wearing eyeglasses, please bring a case.  DO NOT WEAR CONTACT LENSES.      You may shower, brush your teeth, and use deodorant.      PLEASE BRING YOUR CPAP MACHINE INTO THE PREOPERATIVE AREA TO BE CHECKED    MEDICATIONS: DAY OF SURGERY     Take your medications with a sip of water as directed on the medication list.   Anxiety and pain medications may be taken as prescribed at any time prior to  arrival.    Bring your inhalers and eye drops on the day of surgery to be used as prescribed.   All other medications will be administered to you from [facility] under the direction of your surgeon.      AT THE HOSPITAL ON THE DAY OF SURGERY     Park in the Main Ramp garage.  Enter the building through the [facility].        Please stop at the Information Desk so that they may direct you to Surgery Center on [address].      Leave your belongings in the car (except for your CPAP) and your visitors may bring them to your room after surgery.    [facility]    As a convenience, prior to your discharge we will provide any discharge medications you require.      The pharmacy is open from 9am to 5:30pm on weekdays and 10am-2pm on Saturdays.     If you will be alone on the day of surgery and want to leave with your prescribed medication you may:     Bring a check made out to [facility]   Use a credit card to pay for your prescription  Have your family call the pharmacy with a credit card number   Only bring cash to the hospital as a last resort. Prescriptions cannot be filled without payment.  Thank you for your consideration.    QUESTIONS?     Question about these instructions? Call [phone], select option 2, and leave a message for a nurse to return your call.   Any questions regarding specifics about your surgery or recovery? Please call your surgeons office.

## 2016-08-24 NOTE — Anesthesia Preprocedure Evaluation (Addendum)
Anesthesia Pre-operative History and Physical for Shari Harvey  History and Physical Performed at [facility]      CPM Summary:  [name] presents preoperatively for anesthesia evaluation prior to laparoscopic gastrectomy sleeve.  Patients current BMI of 35.3 with the presence of co-morbidities supports this.  She has a past medical history of Anxiety; Asthma; Complication of anesthesia; Fatigue; GERD (gastroesophageal reflux disease); Herniated lumbar disc without myelopathy; History of rectal abscess; Hyperlipidemia; Hypermobility syndrome; Hypoglycemia; Insomnia; Irritable bowel syndrome; Migraines; Morbid obesity; Numbness and tingling; OSA on CPAP; Osteoarthritis; Rash; Shortness of breath; Stress incontinence; and Supraventricular arrhythmia. The patient is moderately active, climbs stairs and can lie flat.  Patient denies any known personal or family history of complications related to anesthesia with the exception patient states develops post operative hypotension and near syncope.By Renelda Loma, NP at 3:57 PM on 08/24/2016    Anesthesia Evaluation Information Source: patient, records     ANESTHESIA HISTORY    + history of anesthetic complications (post develops post operative hypotension and near syncope)  Pertinent(-):  No Family hx of anesthetic complications    GENERAL    + Obesity          upper body, central, lower body  Pertinent (-):  No contact precautions or anesthesia monitoring restrictions    HEENT  Pertinent (-):  No glaucoma, visual impairment or neck pain PULMONARY    + Smoker          tobacco former    + Asthma          mild intermittent    + Shortness of Breath    + Sleep apnea          CPAP and brought in own  Pertinent(-):  No COPD    CARDIOVASCULAR  Good(4+METs) Exercise Tolerance    + Hx of Dysrhythmias          SVT  Pertinent(-):  No hypertension, past MI, angina, CAD, anticoagulants/antiplatelet medications or hx of DVT    GI/HEPATIC/RENAL  Last PO Intake: >8hr before  procedure and >2hr before procedure (clears)    + GERD          well controlledesophageal issues    + Bowel Issues          IBS  Pertinent(-):  No nausea, vomiting, alcohol use, liver  issues, renal issues or urinary issues NEURO/PSYCH    + Headaches          migraines    + Chronic pain          lower back  Pertinent(-):  No syncope, seizures, cerebrovascular event or peripheral nerve issue    ENDO/OTHER    + Menstruating          currently  Pertinent(-):  No diabetes mellitus, thyroid disease    HEMATOLOGIC    + Blood dyscrasia          hyperlipidemia and polycythemia    + Arthritis  Pertinent(-):  No bruising/bleeding easily       Physical Exam    Airway            Mouth opening: normal            Mallampati: II            TM distance (fb): >3 FB            TM distance (cm): 4            Neck ROM: full  Airway Impression: easy  Dental   Normal Exam   Cardiovascular  Normal Exam           Rhythm: regular           Rate: normal  No friction rub, systolic click, murmur or cardiac device sound      Neurologic    Normal Exam  No sensory deficit and motor deficit    General Survey    No rashes, wounds   Pulmonary   Normal Exam    breath sounds clear to auscultation    No cough, rhonchi, decreased breath sounds    Mental Status   Normal Exam    oriented to person, place and time    Not confused, anxious or depressed       ________________________________________________________________________  PLAN  ASA Score  3  Anesthetic Plan general     Induction (routine IV) General Anesthesia/Sedation Maintenance Plan (inhaled agents, IV bolus and neuromuscular blockade);  Airway Manipulation (direct laryngoscopy); Airway (cuffed ETT); Line ( use current access); Monitoring (standard ASA); Positioning (supine); PONV Plan (ondansetron); Pain (per surgical team); PostOp (PACU)    Informed Consent     Risks:          Risks discussed were commensurate with the plan listed above with the following specific points: N/V,  aspiration, sore throat and hypotension , damage to:(eyes, nerves, teeth), allergic Rx, unexpected serious injury, awareness, death    Anesthetic Consent:         Anesthetic plan (and risks as noted above) were discussed with patient and spouse    Blood products Consent:        Use of blood products discussed with: patient and spouse and they consented    Attending Attestation:  As the primary attending anesthesiologist, I attest that the patient or proxy understands and accepts the risks and benefits of the anesthesia plan. I also attest that I have personally performed a pre-anesthetic examination and evaluation, and prescribed the anesthetic plan for this particular location within 48 hours prior to the anesthetic as documented. Emmit AlexandersGARY M Azra Abrell, MD 4:21 PM

## 2016-08-27 ENCOUNTER — Inpatient Hospital Stay: Payer: PRIVATE HEALTH INSURANCE | Admitting: Family Medicine

## 2016-08-27 ENCOUNTER — Inpatient Hospital Stay
Admission: RE | Admit: 2016-08-27 | Discharge: 2016-08-29 | DRG: 403 | Disposition: A | Discharge status: Home / Self Care | Payer: PRIVATE HEALTH INSURANCE | Source: Ambulatory Visit | Attending: Surgery | Admitting: Surgery

## 2016-08-27 ENCOUNTER — Encounter
Admission: RE | Disposition: A | Discharge status: Home / Self Care | Payer: Self-pay | Source: Ambulatory Visit | Attending: Surgery

## 2016-08-27 ENCOUNTER — Encounter: Payer: Self-pay | Admitting: Anesthesiology

## 2016-08-27 ENCOUNTER — Ambulatory Visit: Payer: PRIVATE HEALTH INSURANCE

## 2016-08-27 DIAGNOSIS — Z87891 Personal history of nicotine dependence: Secondary | ICD-10-CM

## 2016-08-27 DIAGNOSIS — G43909 Migraine, unspecified, not intractable, without status migrainosus: Secondary | ICD-10-CM | POA: Diagnosis present

## 2016-08-27 DIAGNOSIS — J45909 Unspecified asthma, uncomplicated: Secondary | ICD-10-CM | POA: Diagnosis present

## 2016-08-27 DIAGNOSIS — Z9884 Bariatric surgery status: Secondary | ICD-10-CM

## 2016-08-27 DIAGNOSIS — Z6835 Body mass index (BMI) 35.0-35.9, adult: Secondary | ICD-10-CM

## 2016-08-27 DIAGNOSIS — G4733 Obstructive sleep apnea (adult) (pediatric): Secondary | ICD-10-CM | POA: Diagnosis present

## 2016-08-27 DIAGNOSIS — K219 Gastro-esophageal reflux disease without esophagitis: Secondary | ICD-10-CM | POA: Diagnosis present

## 2016-08-27 DIAGNOSIS — K760 Fatty (change of) liver, not elsewhere classified: Secondary | ICD-10-CM

## 2016-08-27 DIAGNOSIS — E785 Hyperlipidemia, unspecified: Secondary | ICD-10-CM | POA: Diagnosis present

## 2016-08-27 DIAGNOSIS — K589 Irritable bowel syndrome without diarrhea: Secondary | ICD-10-CM | POA: Diagnosis present

## 2016-08-27 HISTORY — PX: PR LAPS GSTRC RSTRICTIV PX LONGITUDINAL GASTRECTOMY: 43775

## 2016-08-27 HISTORY — PX: PR LAP, GAST RESTRICT PROC, LONGITUDINAL GASTRECTOMY: 43775

## 2016-08-27 LAB — POCT URINE PREGNANCY: Lot #: 7040040

## 2016-08-27 LAB — POCT GLUCOSE: Glucose POCT: 68 mg/dL (ref 60–99)

## 2016-08-27 SURGERY — GASTRECTOMY, SLEEVE, LAPAROSCOPIC
Anesthesia: General | Site: Abdomen | Wound class: Clean Contaminated

## 2016-08-27 MED ORDER — SUCCINYLCHOLINE CHLORIDE 20 MG/ML IV/IJ SOLN *WRAPPED*
Status: DC | PRN
Start: 2016-08-27 — End: 2016-08-27
  Administered 2016-08-27: 100 mg via INTRAVENOUS

## 2016-08-27 MED ORDER — HALOPERIDOL LACTATE 5 MG/ML IJ SOLN *I*
0.5000 mg | Freq: Once | INTRAMUSCULAR | Status: AC | PRN
Start: 2016-08-27 — End: 2016-08-27
  Administered 2016-08-27: 0.5 mg via INTRAVENOUS
  Filled 2016-08-27: qty 1

## 2016-08-27 MED ORDER — LIDOCAINE HCL 2 % IJ SOLN *I*
INTRAMUSCULAR | Status: DC | PRN
Start: 2016-08-27 — End: 2016-08-27
  Administered 2016-08-27: 60 mg via INTRAVENOUS

## 2016-08-27 MED ORDER — ONDANSETRON HCL 2 MG/ML IV SOLN *I*
4.0000 mg | INTRAMUSCULAR | Status: DC
Start: 2016-08-27 — End: 2016-08-28
  Administered 2016-08-27 – 2016-08-28 (×6): 4 mg via INTRAVENOUS
  Filled 2016-08-27 (×6): qty 2

## 2016-08-27 MED ORDER — HYDRALAZINE HCL 20 MG/ML IJ SOLN *I*
10.0000 mg | Freq: Four times a day (QID) | INTRAMUSCULAR | Status: DC | PRN
Start: 2016-08-27 — End: 2016-08-29

## 2016-08-27 MED ORDER — ALBUTEROL SULFATE (2.5 MG/3ML) 0.083% IN NEBU *I*
2.5000 mg | INHALATION_SOLUTION | Freq: Once | RESPIRATORY_TRACT | Status: DC | PRN
Start: 2016-08-27 — End: 2016-08-27

## 2016-08-27 MED ORDER — FENTANYL CITRATE 50 MCG/ML IJ SOLN *WRAPPED*
INTRAMUSCULAR | Status: AC
Start: 2016-08-27 — End: 2016-08-27
  Filled 2016-08-27: qty 2

## 2016-08-27 MED ORDER — AMPICILLIN-SULBACTAM IN NS 3 GM *I*
3000.0000 mg | INTRAMUSCULAR | Status: AC
Start: 2016-08-27 — End: 2016-08-27
  Administered 2016-08-27: 3 g via INTRAVENOUS
  Filled 2016-08-27: qty 100

## 2016-08-27 MED ORDER — ROCURONIUM BROMIDE 10 MG/ML IV SOLN *WRAPPED*
Status: DC | PRN
Start: 2016-08-27 — End: 2016-08-27
  Administered 2016-08-27: 10 mg via INTRAVENOUS
  Administered 2016-08-27: 5 mg via INTRAVENOUS
  Administered 2016-08-27: 35 mg via INTRAVENOUS

## 2016-08-27 MED ORDER — BUPIVACAINE HCL 0.25 % IJ SOLUTION *WRAPPED*
Status: DC | PRN
Start: 2016-08-27 — End: 2016-08-27
  Administered 2016-08-27: 30 mL via SUBCUTANEOUS

## 2016-08-27 MED ORDER — HYDRALAZINE HCL 20 MG/ML IJ SOLN *I*
INTRAMUSCULAR | Status: DC | PRN
Start: 2016-08-27 — End: 2016-08-27
  Administered 2016-08-27: 5 mg via INTRAVENOUS

## 2016-08-27 MED ORDER — NEOSTIGMINE METHYLSULFATE 10 MG/10ML IV SOLN *I*
INTRAVENOUS | Status: AC
Start: 2016-08-27 — End: 2016-08-27
  Filled 2016-08-27: qty 3

## 2016-08-27 MED ORDER — MORPHINE SULFATE 2 MG/ML IV SOLN *WRAPPED*
2.0000 mg | Status: AC | PRN
Start: 2016-08-27 — End: 2016-08-28
  Administered 2016-08-27 – 2016-08-28 (×3): 2 mg via INTRAVENOUS
  Filled 2016-08-27 (×3): qty 1

## 2016-08-27 MED ORDER — PROMETHAZINE HCL 25 MG/ML IJ SOLN *I*
25.0000 mg | Freq: Four times a day (QID) | INTRAMUSCULAR | Status: DC
Start: 2016-08-27 — End: 2016-08-28
  Administered 2016-08-27 – 2016-08-28 (×2): 25 mg via INTRAVENOUS
  Filled 2016-08-27 (×2): qty 1

## 2016-08-27 MED ORDER — MORPHINE SULFATE *PF* 1 MG/ML IJ SOLN *I*
INTRAMUSCULAR | Status: DC | PRN
Start: 2016-08-27 — End: 2016-08-27
  Administered 2016-08-27 (×3): 4 mg via INTRAVENOUS

## 2016-08-27 MED ORDER — MORPHINE SULFATE 4 MG/ML IV SOLN *WRAPPED*
INTRAVENOUS | Status: AC
Start: 2016-08-27 — End: 2016-08-27
  Filled 2016-08-27: qty 1

## 2016-08-27 MED ORDER — LIDOCAINE HCL 1 % IJ SOLN *I*
0.1000 mL | INTRAMUSCULAR | Status: DC | PRN
Start: 2016-08-27 — End: 2016-08-27
  Administered 2016-08-27: 0.1 mL via SUBCUTANEOUS
  Filled 2016-08-27: qty 2

## 2016-08-27 MED ORDER — LIDOCAINE HCL 2 % (PF) IJ SOLN *I*
INTRAMUSCULAR | Status: AC
Start: 2016-08-27 — End: 2016-08-27
  Filled 2016-08-27: qty 5

## 2016-08-27 MED ORDER — MIDAZOLAM HCL 5 MG/5ML IJ SOLN *I*
INTRAMUSCULAR | Status: AC
Start: 2016-08-27 — End: 2016-08-27
  Filled 2016-08-27: qty 5

## 2016-08-27 MED ORDER — ENOXAPARIN SODIUM 40 MG/0.4ML IJ SOSY *I*
40.0000 mg | PREFILLED_SYRINGE | Freq: Two times a day (BID) | INTRAMUSCULAR | Status: DC
Start: 2016-08-27 — End: 2016-08-29
  Administered 2016-08-27 – 2016-08-29 (×4): 40 mg via SUBCUTANEOUS
  Filled 2016-08-27 (×4): qty 0.4

## 2016-08-27 MED ORDER — EPHEDRINE 5MG/ML IN NS IV/IJ *WRAPPED*
INTRAMUSCULAR | Status: AC
Start: 2016-08-27 — End: 2016-08-27
  Filled 2016-08-27: qty 5

## 2016-08-27 MED ORDER — AMPICILLIN-SULBACTAM IN NS 3 GM *I*
3000.0000 mg | Freq: Four times a day (QID) | INTRAMUSCULAR | Status: AC
Start: 2016-08-27 — End: 2016-08-28
  Administered 2016-08-27 – 2016-08-28 (×3): 3000 mg via INTRAVENOUS
  Filled 2016-08-27 (×3): qty 100

## 2016-08-27 MED ORDER — GLYCOPYRROLATE 0.2 MG/ML IJ SOLN *WRAPPED*
INTRAMUSCULAR | Status: AC
Start: 2016-08-27 — End: 2016-08-27
  Filled 2016-08-27: qty 2

## 2016-08-27 MED ORDER — KCL-LACTATED RINGERS-D5W 0.15 % IV SOLN *I*
125.0000 mL/h | INTRAVENOUS | Status: DC
Start: 2016-08-27 — End: 2016-08-28
  Administered 2016-08-27 – 2016-08-28 (×2): 125 mL/h via INTRAVENOUS

## 2016-08-27 MED ORDER — KETOROLAC TROMETHAMINE 30 MG/ML IJ SOLN *I*
15.0000 mg | Freq: Four times a day (QID) | INTRAMUSCULAR | Status: DC
Start: 2016-08-27 — End: 2016-08-29
  Administered 2016-08-27 – 2016-08-28 (×5): 15 mg via INTRAVENOUS
  Filled 2016-08-27 (×5): qty 1

## 2016-08-27 MED ORDER — ROCURONIUM BROMIDE 10 MG/ML IV SOLN *WRAPPED*
Status: AC
Start: 2016-08-27 — End: 2016-08-27
  Filled 2016-08-27: qty 5

## 2016-08-27 MED ORDER — ONDANSETRON HCL 2 MG/ML IV SOLN *I*
INTRAMUSCULAR | Status: AC
Start: 2016-08-27 — End: 2016-08-27
  Filled 2016-08-27: qty 2

## 2016-08-27 MED ORDER — FENTANYL CITRATE 50 MCG/ML IJ SOLN *WRAPPED*
INTRAMUSCULAR | Status: DC | PRN
Start: 2016-08-27 — End: 2016-08-27
  Administered 2016-08-27 (×2): 100 ug via INTRAVENOUS
  Administered 2016-08-27 (×4): 25 ug via INTRAVENOUS

## 2016-08-27 MED ORDER — PROPOFOL 10 MG/ML IV EMUL (INTERMITTENT DOSING) WRAPPED *I*
INTRAVENOUS | Status: AC
Start: 2016-08-27 — End: 2016-08-27
  Filled 2016-08-27: qty 20

## 2016-08-27 MED ORDER — LACTATED RINGERS IV SOLN *I*
20.0000 mL/h | INTRAVENOUS | Status: DC
Start: 2016-08-27 — End: 2016-08-27
  Administered 2016-08-27 (×2): 20 mL/h via INTRAVENOUS

## 2016-08-27 MED ORDER — PANTOPRAZOLE SODIUM 40 MG IV SOLR *I*
40.0000 mg | Freq: Every day | INTRAVENOUS | Status: AC
Start: 2016-08-28 — End: 2016-08-28
  Administered 2016-08-28: 40 mg via INTRAVENOUS
  Filled 2016-08-27: qty 10

## 2016-08-27 MED ORDER — PROPOFOL 10 MG/ML IV EMUL (INTERMITTENT DOSING) WRAPPED *I*
INTRAVENOUS | Status: DC | PRN
Start: 2016-08-27 — End: 2016-08-27
  Administered 2016-08-27: 160 mg via INTRAVENOUS
  Administered 2016-08-27: 50 mg via INTRAVENOUS

## 2016-08-27 MED ORDER — DIPHENHYDRAMINE HCL 50 MG/ML IJ SOLN *I*
25.0000 mg | INTRAMUSCULAR | Status: DC | PRN
Start: 2016-08-27 — End: 2016-08-27

## 2016-08-27 MED ORDER — HYDRALAZINE HCL 20 MG/ML IJ SOLN *I*
INTRAMUSCULAR | Status: AC
Start: 2016-08-27 — End: 2016-08-27
  Filled 2016-08-27: qty 1

## 2016-08-27 MED ORDER — GLYCOPYRROLATE 0.2 MG/ML IJ SOLN *I*
INTRAMUSCULAR | Status: DC | PRN
Start: 2016-08-27 — End: 2016-08-27
  Administered 2016-08-27: 0.4 mg via INTRAVENOUS
  Administered 2016-08-27: 0.2 mg via INTRAVENOUS

## 2016-08-27 MED ORDER — LACTATED RINGERS IV SOLN *I*
125.0000 mL/h | INTRAVENOUS | Status: DC
Start: 2016-08-27 — End: 2016-08-27
  Administered 2016-08-27: 125 mL/h via INTRAVENOUS

## 2016-08-27 MED ORDER — MORPHINE SULFATE 2 MG/ML IV SOLN *WRAPPED*
2.0000 mg | Status: DC | PRN
Start: 2016-08-27 — End: 2016-08-27
  Administered 2016-08-27 (×3): 2 mg via INTRAVENOUS
  Filled 2016-08-27 (×3): qty 1

## 2016-08-27 MED ORDER — SUCCINYLCHOLINE CHLORIDE 20 MG/ML IV/IJ SOLN *WRAPPED*
Status: AC
Start: 2016-08-27 — End: 2016-08-27
  Filled 2016-08-27: qty 10

## 2016-08-27 MED ORDER — MEPERIDINE HCL 25 MG/ML IJ SOLN *I*
12.5000 mg | INTRAMUSCULAR | Status: DC | PRN
Start: 2016-08-27 — End: 2016-08-27

## 2016-08-27 MED ORDER — MORPHINE SULFATE 4 MG/ML IV SOLN *WRAPPED*
4.0000 mg | INTRAVENOUS | Status: AC | PRN
Start: 2016-08-27 — End: 2016-08-28

## 2016-08-27 MED ORDER — NEOSTIGMINE METHYLSULFATE 10 MG/10ML IV SOLN *I*
INTRAVENOUS | Status: DC | PRN
Start: 2016-08-27 — End: 2016-08-27
  Administered 2016-08-27: 3 mg via INTRAVENOUS

## 2016-08-27 MED ORDER — HEPARIN SODIUM 5000 UNIT/ML SQ *I*
5000.0000 [IU] | Freq: Once | SUBCUTANEOUS | Status: AC
Start: 2016-08-27 — End: 2016-08-27
  Administered 2016-08-27: 5000 [IU] via SUBCUTANEOUS
  Filled 2016-08-27: qty 1

## 2016-08-27 MED ORDER — EPHEDRINE 5MG/ML IN NS IV/IJ *WRAPPED*
INTRAMUSCULAR | Status: DC | PRN
Start: 2016-08-27 — End: 2016-08-27
  Administered 2016-08-27 (×2): 10 mg via INTRAVENOUS

## 2016-08-27 MED ORDER — MIDAZOLAM HCL 1 MG/ML IJ SOLN *I* WRAPPED
INTRAMUSCULAR | Status: DC | PRN
Start: 2016-08-27 — End: 2016-08-27
  Administered 2016-08-27: 3 mg via INTRAVENOUS
  Administered 2016-08-27: 2 mg via INTRAVENOUS

## 2016-08-27 MED ORDER — SUMATRIPTAN SUCCINATE 12 MG/ML SC SOLUTION *WRAPPED*
6.0000 mg | Freq: Once | SUBCUTANEOUS | Status: AC | PRN
Start: 2016-08-27 — End: 2016-08-27
  Administered 2016-08-27: 6 mg via SUBCUTANEOUS
  Filled 2016-08-27 (×2): qty 0.5

## 2016-08-27 MED ORDER — ONDANSETRON HCL 2 MG/ML IV SOLN *I*
INTRAMUSCULAR | Status: DC | PRN
Start: 2016-08-27 — End: 2016-08-27
  Administered 2016-08-27: 4 mg via INTRAMUSCULAR

## 2016-08-27 MED ORDER — PROMETHAZINE HCL 25 MG/ML IJ SOLN *I*
6.2500 mg | Freq: Once | INTRAMUSCULAR | Status: AC | PRN
Start: 2016-08-27 — End: 2016-08-27
  Administered 2016-08-27: 6.25 mg via INTRAVENOUS
  Filled 2016-08-27: qty 1

## 2016-08-27 SURGICAL SUPPLY — 32 items
ADHESIVE SKIN CLOSURE 0.7ML DERMABOND ADVANCED (Dressing) ×2 IMPLANT
BANDAGE ELASTIC MATRIX 6X5YD (Other) ×4 IMPLANT
COVER FOOT IMPAD LG NONSTER (Supply) IMPLANT
COVER FOOT IMPAD REGULAR NL (Supply) ×2 IMPLANT
ENDO CLOSE USSC-TROCAR SITE CL (Supply) ×2 IMPLANT
GLOVE SURG PROTEXIS PI 7.5 PF SYN (Glove) ×21 IMPLANT
GOWN PACK XL (Gown) ×1
HANDPIECE S/I 5MM X 32CM CANNULA DISP (Supply) ×2 IMPLANT
KIT VUETIP TROCAR SWAB ACCESSORY (Supply) ×2 IMPLANT
PACK CUSTOM LAPAROSCOPIC GASTRIC BYPASS (Pack) ×2 IMPLANT
PACK GOWN SURGICAL XL (Gown) ×1 IMPLANT
RELOAD ECHELON GST BLACK (Other) ×2 IMPLANT
RELOAD ECHELON GST BLUE (Other) ×8 IMPLANT
RELOAD ECHELON GST GOLD (Other) ×2 IMPLANT
RELOAD ECHELON GST GREEN (Other) ×2 IMPLANT
ROD OST L4IN PLAS LOOP EYELET BOTH END DISP (Supply) ×1 IMPLANT
ROD OSTOMY LOOP 4IN (Supply) ×1
SHEARS HARMONIC HD 1000I 36CM SHAFT (Other) ×2 IMPLANT
SLEEVE VISIGI GASTRECTOMY 36 FR W/BULB (Other) ×2 IMPLANT
SLEEVE XCEL 12MMX100MM (Other) ×2 IMPLANT
SOL H2O IRRIG STER 1000ML BTL (Solution) ×1
SOL SOD CHL IV .9PCT 1000ML BAG (Drug) ×2 IMPLANT
SOL WATER IRRIG STERILE 1000ML BTL (Solution) ×1 IMPLANT
STAPLER ECHELON FLEX POWERED 60 340MM (Other) ×2 IMPLANT
STAPLER ENDOSCP 60 WHT BLU G GRN BLK SEAMGRD BIOABSRB STPL LN REINF ECHELON ENDOPATH (Supply) ×17 IMPLANT
SUTR MONOCRYL PLUS 4-0 18PS2 (Suture) ×4 IMPLANT
SUTR PROLENE MONO 2-0 KS BLUE (Suture) ×2 IMPLANT
SUTR VICRYL CTD 0 SUTUPAK VIOLET (Suture) ×2 IMPLANT
TABLE STRAP OR 4X72IN (Other) ×6 IMPLANT
TROCAR XCEL ENDOPATH BLADELESS 12X100MM (Other) ×2 IMPLANT
TROCAR XCEL ENDOPATH BLADELESS 5X100MM (Other) ×2 IMPLANT
TUBING INSUFFLATON F UHI (Tubing) ×2 IMPLANT

## 2016-08-27 NOTE — Op Note (Signed)
PATIENT: Shari Harvey DOB: 11/01/1975   MR #: [id number] AGE: 41 y.o.              SURGEON: Darrell JewelWilliam E Grayer Sproles, MD    ASSISTANT:  Hykin  SURGERY DATE:  08/28/16  PREOPERATIVE DIAGNOSIS: Morbid obesity  POSTOPERATIVE DIAGNOSIS: Morbid obesity  OPERATIVE PROCEDURE: Laparoscopic sleeve gastrectomy.   ANESTHESIA: General.   INDICATIONS: The patient is a 41 y.o. female, Height: 170.2 cm (5\' 7" ) , Weight: 103.4 kg (228 lb) , Body mass index is 35.71 kg/(m^2)., who has failed to achieve significant sustained weight loss with nonsurgical methods.   FINDINGS AT OPERATION: Mild hepatic steatosis.   DESCRIPTION OF PROCEDURE: The patient was brought to the operating room and placed in supine position. After adequate general endotracheal anesthesia was obtained, the patient's abdomen was prepped and draped in sterile fashion. The Ioban drape was used. Abdominal cavity was entered through a left epigastric incision with the bladeless 5 mm trocar loaded with the 5 mm laparoscope under laparoscopic observation and pneumoperitoneum established to 15 mmHg pressure carbon dioxide.  The 5mm port was then placed at the midline near the umbilicus and replaced by a 12mm bladeless port on the LUQ incision.  A second bladeless 12mm port was then placed under laparoscopic guidance in the RUQ. The patient was placed in reverse Trendelenburg position.  The stomach was decompressed by a 36-French orogastric tube. The left lobe of the liver was retracted off of the stomach with a 2-0 Prolene on a Keith needle . The gastrocolic omentum was dissected off the greater curvature of the stomach from the angle of His to the prepyloric area with the Harmonic Scalpel. The 36-French Ewald tube was advanced so that its tip was at the pylorus, the body of the tube was along the entire length of the lesser curvature of the stomach. The sleeve gastrectomy was begun by applying the GIA 60 stapler with the green cartridge and Seamguard reinforcement tangentially  across the greater curvature of the stomach at a point approximately 5 cm proximal to the pylorus. After firing the 1st stapler using the 36-French tube as a sizer, multiple serial firings were then performed using the same type of stapler just lateral to the width of the 36-French tube towards the angle of His with a second green load following the first, followed by gold cartridge and multiple blue cartridges, all with Seamguard reinforcement. The staple line was hemostatic. The tube was withdrawn into the proximal sleeve stomach. The sleeve stomach was occluded proximal to the pylorus with the DeBakey clamp and the sleeve stomach was insufflated with oxygen via the Ewald tube while submerged under saline. There was no evidence of leak. The sleeve stomach was decompressed with the tube and the tube was removed. The upper abdomen was irrigated with normal saline. The liver stitch was removed and the puncture sites were cauterized and hemostatic. The sleeve gastrectomy specimen was withdrawn through the right upper quadrant 12 mm trocar site, the fascia of which was then closed with the Endoclose device and 0 Vicryl ligature. Pneumoperitoneum was allowed to escape. Trocars were withdrawn. The wounds irrigated with normal saline, infiltrated with 0.25% Marcaine and closed with staples. All needle, sponge, and instrument counts were reported as correct. The patient tolerated the procedure well and was transferred to the post anesthesia care unit in stable condition.

## 2016-08-27 NOTE — Anesthesia Procedure Notes (Signed)
---------------------------------------------------------------------------------------------------------------------------------------    AIRWAY   GENERAL INFORMATION AND STAFF    Patient location during procedure: OR       Date of Procedure: 08/27/2016 4:50 PM  CONDITION PRIOR TO MANIPULATION     Current Airway/Neck Condition:  Normal        For more airway physical exam details, see Anesthesia PreOp Evaluation  AIRWAY METHOD     Patient Position:  Sniffing    Preoxygenated: yes      Induction: IV and Succinylcholine    Mask Difficulty Assessment:  2 - vent by mask + OPA/NPA       Mask NMB: 2 - vent by mask + OPA/NPA      Technique Used for Successful ETT Placement:  Direct laryngoscopy    Blade Type:  Miller    Laryngoscope Blade/Video laryngoscope Blade Size:  2    Cormack-Lehane Classification:  Grade I - full view of glottis    Placement Verified by: capnometry, auscultation, palpation of cuff and equal breath sounds      Number of Attempts at Approach:  1    Number of Other Approaches Attempted:  0  FINAL AIRWAY DETAILS    Final Airway Type:  Endotracheal airway    Adjunct Airway: oropharyngeal airway (OPA)    OPA Size:  90mm    Final Endotracheal Airway:  ETT      Cuffed: cuffed    Insertion Site:  Oral    ETT Size (mm):  7.0    Distance inserted from Teeth (cm):  22  ----------------------------------------------------------------------------------------------------------------------------------------

## 2016-08-27 NOTE — Anesthesia Case Conclusion (Signed)
CASE CONCLUSION  Emergence  Actions:  Suctioned and extubated  Criteria Used for Airway Removal:  Adequate Tv & RR, acceptable O2 saturation, following commands and sustained tetany  Assessment:  Routine  Transport  Directly to: PACU  Position:  Upright  Patient Condition on Handoff  Level of Consciousness:  Mildly sedated  Patient Condition:  Stable  Handoff Report to:  RN

## 2016-08-27 NOTE — Progress Notes (Signed)
Division II Surgery Post Op Check Note    Patient: Kalijah R Drew    LOS: 0 days    Attending: Darrell Jewelmalley, William E, MD     INTERVAL HISTORY & SUBJECTIVE  No complaints. Pain well controlled. Denies n/v, soa, cp. Ambulated and voided     OBJECTIVE  Physical Exam:  Patient Vitals for the past 4 hrs:   BP Temp Temp src Pulse Resp SpO2   08/27/16 1936 137/81 36.3 C (97.3 F) TEMPORAL 88 16 98 %   08/27/16 1900 141/80 - - 77 19 94 %   08/27/16 1845 138/80 36.7 C (98.1 F) TEMPORAL 86 19 94 %   08/27/16 1830 141/85 - - 88 20 95 %   08/27/16 1815 138/78 - - 83 19 96 %   08/27/16 1800 143/80 - - 95 21 97 %   08/27/16 1755 147/83 36.6 C (97.9 F) TEMPORAL 94 21 96 %     GEN: no apparent distress  HEENT: NCAT, face symmetric  CHEST: nonlabored respirations  ABD: obese, soft, nondistended, nontender, trocar sites cdi  NEURO/MOTOR: alert, appropriate  EXTREMITIES: atraumatic, SCDs on and functioning  VASCULAR: feet wwp, no edema     ASSESSMENT  Elnita R Musso is a 41 y.o. female with h/o obesity who is POD0 from lap sleeve. Doing well.    PLAN   NPO, IVF@125ml /hr   Unasyn x 24 hrs   IV analgesia   Antiemetics, IV PPI   OOB/ambulate   DVT ppx    Author: Lavina Hammanourtney Alessandra Sawdey, MD as of: 08/27/2016  at: 9:09 PM

## 2016-08-27 NOTE — Progress Notes (Signed)
UPDATES TO PATIENT'S CONDITION on the DAY OF SURGERY/PROCEDURE    I. Updates to Patient's Condition (to be completed by a provider privileged to complete a H&P, following reassessment of the patient by the provider):    Day of Surgery/Procedure Update:  History  History reviewed and no change    Physical  Physical exam updated and no change            II. Procedure Readiness   I have reviewed the patient's H&P and updated condition. By completing and signing this form, I attest that this patient is ready for surgery/procedure.    III. Attestation   I have reviewed the updated information regarding the patient's condition and it is appropriate to proceed with the planned surgery/procedure.    Darrell JewelWILLIAM E Damante Spragg, MD as of 3:32 PM 08/27/2016

## 2016-08-28 LAB — EKG 12-LEAD
P: -1 deg
PR: 140 ms
QRS: 31 deg
QRSD: 78 ms
QT: 396 ms
QTc: 431 ms
Rate: 71 {beats}/min
Severity: BORDERLINE
Statement: ABNORMAL
T: 33 deg

## 2016-08-28 LAB — BASIC METABOLIC PANEL
Anion Gap: 15 (ref 7–16)
CO2: 19 mmol/L — ABNORMAL LOW (ref 20–28)
Calcium: 8.6 mg/dL — ABNORMAL LOW (ref 8.8–10.2)
Chloride: 105 mmol/L (ref 96–108)
Creatinine: 0.58 mg/dL (ref 0.51–0.95)
GFR,Black: 133 *
GFR,Caucasian: 115 *
Glucose: 127 mg/dL — ABNORMAL HIGH (ref 60–99)
Lab: 9 mg/dL (ref 6–20)
Potassium: 3.6 mmol/L (ref 3.3–5.1)
Sodium: 139 mmol/L (ref 133–145)

## 2016-08-28 LAB — MCHC: MCHC: 35 g/dL (ref 32–36)

## 2016-08-28 LAB — HEMATOCRIT: Hematocrit: 39 % (ref 34–45)

## 2016-08-28 LAB — POCT GLUCOSE: Glucose POCT: 161 mg/dL — ABNORMAL HIGH (ref 60–99)

## 2016-08-28 MED ORDER — CLONAZEPAM 0.5 MG PO TABS *I*
0.5000 mg | ORAL_TABLET | Freq: Two times a day (BID) | ORAL | Status: DC | PRN
Start: 2016-08-28 — End: 2016-08-29

## 2016-08-28 MED ORDER — LORATADINE 10 MG PO TABS *I*
10.0000 mg | ORAL_TABLET | Freq: Every day | ORAL | Status: DC
Start: 2016-08-28 — End: 2016-08-29
  Administered 2016-08-28: 10 mg via ORAL
  Filled 2016-08-28: qty 1

## 2016-08-28 MED ORDER — DEXAMETHASONE SODIUM PHOSPHATE 4 MG/ML INJ SOLN *WRAPPED*
4.0000 mg | Freq: Four times a day (QID) | INTRAMUSCULAR | Status: DC
Start: 2016-08-28 — End: 2016-08-29
  Administered 2016-08-28 (×3): 4 mg via INTRAVENOUS
  Filled 2016-08-28 (×3): qty 1

## 2016-08-28 MED ORDER — SUMATRIPTAN SUCCINATE 50 MG PO TABS *I*
100.0000 mg | ORAL_TABLET | ORAL | Status: DC | PRN
Start: 2016-08-28 — End: 2016-08-29

## 2016-08-28 MED ORDER — ONDANSETRON HCL 4 MG PO TABS *I*
4.0000 mg | ORAL_TABLET | ORAL | Status: DC
Start: 2016-08-28 — End: 2016-08-29
  Administered 2016-08-28 – 2016-08-29 (×3): 4 mg via ORAL
  Filled 2016-08-28 (×4): qty 1

## 2016-08-28 MED ORDER — MOMETASONE FUROATE 220 MCG/INH IN AEPB INHALER *I*
2.0000 | INHALATION_SPRAY | Freq: Every evening | RESPIRATORY_TRACT | Status: DC
Start: 2016-08-28 — End: 2016-08-29
  Administered 2016-08-28: 2 via RESPIRATORY_TRACT
  Filled 2016-08-28: qty 1

## 2016-08-28 MED ORDER — PANTOPRAZOLE SODIUM 40 MG PO TBEC *I*
40.0000 mg | DELAYED_RELEASE_TABLET | Freq: Every evening | ORAL | Status: DC
Start: 2016-08-28 — End: 2016-08-29
  Administered 2016-08-28: 40 mg via ORAL
  Filled 2016-08-28: qty 1

## 2016-08-28 MED ORDER — HYDROCODONE-ACETAMINOPHEN 5-325 MG PO TABS *I*
2.0000 | ORAL_TABLET | Freq: Four times a day (QID) | ORAL | Status: DC | PRN
Start: 2016-08-28 — End: 2016-08-28

## 2016-08-28 MED ORDER — MONTELUKAST SODIUM 10 MG PO TABS *I*
10.0000 mg | ORAL_TABLET | Freq: Every day | ORAL | Status: DC
Start: 2016-08-28 — End: 2016-08-29
  Administered 2016-08-28: 10 mg via ORAL
  Filled 2016-08-28: qty 1

## 2016-08-28 MED ORDER — KCL IN DEXTROSE-NACL 20-5-0.45 MEQ/L-%-% IV SOLN *I*
75.0000 mL/h | INTRAVENOUS | Status: DC
Start: 2016-08-28 — End: 2016-08-29
  Administered 2016-08-28: 75 mL/h via INTRAVENOUS

## 2016-08-28 MED ORDER — SUMATRIPTAN SUCCINATE 12 MG/ML SC SOLUTION *WRAPPED*
6.0000 mg | Freq: Once | SUBCUTANEOUS | Status: AC
Start: 2016-08-28 — End: 2016-08-28

## 2016-08-28 MED ORDER — PROMETHAZINE HCL 25 MG PO TABS *I*
25.0000 mg | ORAL_TABLET | Freq: Four times a day (QID) | ORAL | Status: DC
Start: 2016-08-28 — End: 2016-08-29
  Administered 2016-08-28 – 2016-08-29 (×2): 25 mg via ORAL
  Filled 2016-08-28 (×3): qty 1

## 2016-08-28 MED ORDER — TOPIRAMATE 100 MG PO TABS *I*
200.0000 mg | ORAL_TABLET | Freq: Every evening | ORAL | Status: DC
Start: 2016-08-28 — End: 2016-08-29
  Administered 2016-08-28: 200 mg via ORAL
  Filled 2016-08-28: qty 2

## 2016-08-28 MED ORDER — HYDROCODONE-ACETAMINOPHEN 5-325 MG PO TABS *I*
1.0000 | ORAL_TABLET | Freq: Four times a day (QID) | ORAL | Status: DC | PRN
Start: 2016-08-28 — End: 2016-08-28

## 2016-08-28 MED ORDER — DOCUSATE SODIUM 100 MG PO CAPS *I*
100.0000 mg | ORAL_CAPSULE | Freq: Two times a day (BID) | ORAL | Status: DC
Start: 2016-08-28 — End: 2016-08-29
  Administered 2016-08-28 – 2016-08-29 (×2): 100 mg via ORAL
  Filled 2016-08-28 (×2): qty 1

## 2016-08-28 MED ORDER — DIPHENHYDRAMINE HCL 25 MG PO TABS *I*
50.0000 mg | ORAL_TABLET | Freq: Every evening | ORAL | Status: DC | PRN
Start: 2016-08-28 — End: 2016-08-29

## 2016-08-28 MED ORDER — OXYCODONE-ACETAMINOPHEN 5-325 MG PO TABS *I*
2.0000 | ORAL_TABLET | Freq: Four times a day (QID) | ORAL | Status: DC | PRN
Start: 2016-08-28 — End: 2016-08-29
  Administered 2016-08-29: 2 via ORAL
  Filled 2016-08-28: qty 2

## 2016-08-28 MED ORDER — OXYCODONE-ACETAMINOPHEN 5-325 MG PO TABS *I*
1.0000 | ORAL_TABLET | Freq: Four times a day (QID) | ORAL | Status: DC | PRN
Start: 2016-08-28 — End: 2016-08-29
  Administered 2016-08-28 – 2016-08-29 (×2): 1 via ORAL
  Filled 2016-08-28 (×2): qty 1

## 2016-08-28 MED ORDER — ALBUTEROL SULFATE HFA 108 (90 BASE) MCG/ACT IN AERS *I*
2.0000 | INHALATION_SPRAY | Freq: Four times a day (QID) | RESPIRATORY_TRACT | Status: DC | PRN
Start: 2016-08-28 — End: 2016-08-29
  Filled 2016-08-28: qty 8

## 2016-08-28 MED ORDER — SCOPOLAMINE BASE 1.5 MG TD PT72 *I*
1.0000 | MEDICATED_PATCH | TRANSDERMAL | Status: DC
Start: 2016-08-28 — End: 2016-08-29
  Administered 2016-08-28: 1.5 mg via TRANSDERMAL
  Filled 2016-08-28: qty 1

## 2016-08-28 MED ORDER — SERTRALINE HCL 50 MG PO TABS *I*
100.0000 mg | ORAL_TABLET | Freq: Every day | ORAL | Status: DC
Start: 2016-08-28 — End: 2016-08-29
  Administered 2016-08-28: 100 mg via ORAL
  Filled 2016-08-28: qty 2

## 2016-08-28 NOTE — Progress Notes (Signed)
Gastric bypass liquid diet reviewed with pt.  Receptive. Copy of diet provided.  Rayden Dock RD  Nutrition Services  Pager [phone]

## 2016-08-28 NOTE — Anesthesia Postprocedure Evaluation (Signed)
Anesthesia Post-Op Note    Patient: Shari Harvey    Procedure(s) Performed:  Procedure Summary     Date Anesthesia Start Anesthesia Stop Room / Location    08/27/16 1628 1759 [id number] / [facility] MAIN OR       Procedure Diagnosis Surgeon Attending Anesthesia    LAPAROSCOPIC GASTRECTOMY SLEEVE (N/A Abdomen) Morbid obesity, unspecified obesity type  (morbid obesity) Darrell Jewelmalley, William E, MD Emmit AlexandersKawczynski, Shakaria Raphael M, MD        Recovery Vitals  BP: 124/60 (08/28/2016  1:16 PM)  Heart Rate: 94 (08/28/2016  1:16 PM)  Heart Rate (via Pulse Ox): 77 (08/27/2016  7:00 PM)  Resp: 16 (08/28/2016  1:16 PM)  Temp: 37.8 C (100.1 F) (08/28/2016  1:39 PM)  SpO2: 96 % (08/28/2016  1:16 PM)  O2 Flow Rate: 2 L/min (08/28/2016  1:00 AM)   0-10 Scale: 6 (08/28/2016  2:04 PM)  Anesthesia type:  General  Complications Noted During Procedure or in PACU:  None   Comment:    Patient Location:  [facility] Floor  Level of Consciousness:    Recovered to baseline, oriented, awake and alert  Patient Participation:     Able to participate  Temperature Status:    Normothermic  Oxygen Saturation:    Within patient's normal range  Cardiac Status:   Within patient's normal range  Fluid Status:    Stable  Airway Patency:     Yes  Pulmonary Status:    Baseline  Pain Management:    Adequate analgesia  Nausea and Vomiting:    Controlled    Post Op Assessment:    Tolerated procedure well and no evidence of recall   Attending Attestation:  All indicated post anesthesia care provided     -

## 2016-08-28 NOTE — Student Note (Signed)
Bariatric & GI Surgery Progress Note:  Medical Student Note    Shari Harvey 41 y.o. female/ [phone]    Interval History: NAE overnight    Subjective: In to see patient this morning. She did not sleep very well as she had a migraine overnight and persistent nausea. She states that the nausea has improved this morning but is starting to come back during the interview. She has been ambulating to the bathroom without difficulty. Has not passed flatus or had a bowel movement. Denies any chest pain, SOB, or vomiting.     Objective  Vitals:  Vitals:    08/27/16 1936 08/27/16 2113 08/28/16 0100 08/28/16 0542   BP: 137/81  119/63 124/64   BP Location: Right arm  Right arm Right arm   Pulse: 88  100 95   Resp: 16 16 16 16    Temp: 36.3 C (97.3 F)  36.8 C (98.3 F) 37.8 C (100 F)   TempSrc: Temporal  Temporal Temporal   SpO2: 98%  95% 94%   Weight:       Height:           Physical Exam:  General: Awake, comfortable, lying in lateral decubitus position and appears somewhat uncomfortable.   HEENT: NCAT, oral mucosa moist, no rashes or petechiae, anicteric  CV: RRR, nl S1/S2, no murmurs, rubs, or gallop    Pulm: Clear to auscultation, no wheezes or rales. No increased WOB appreciated.  GI/GU: Non-distended, (+) bowel sounds, soft, non-tender to palpation. Incisions c/d/i  Ext: Warm, no LEE  Neuro: Face symmetric. Moving all extremities. Speech in tact.     Labs: All labs and micro personally reviewed. Notable values below.   Na: 139  K: 3.6  Cr: 0.58    Hematocrit: 39        Date 08/27/16 0700 - 08/28/16 0659 08/28/16 0700 - 08/29/16 0659   Shift 0700-0659 24 Hour Total 0700-0659 24 Hour Total   I  N  T  A  K  E   I.V. 2425 2425        Volume (mL) (Lactated Ringers Infusion) 125 125        Volume (mL) (Lactated Ringers Infusion) 2300 2300      Shift Total  (mL/kg) 2425 2425     O  U  T  P  U  T   Urine 600 600        Urine 600 600        Urine Occurrence 0 x 0 x      Emesis/NG output 0 0        Emesis 0 0         Emesis Occurrence 0 x 0 x      Stool          Stool Occurrence 0 x 0 x      Blood 20 20        Blood Loss 20 20      Shift Total  (mL/kg) 620 620     NET 1805 1805     Weight (kg)   103.4 103.4       Radiology:  - no new imaging      Assessment: Shari Harvey is a 41 y.o. female with a history of OSA, GERD, and HLD POD #1 s/p laparoscopic gastric sleeve. Patient is doing well other than persistent nausea overnight.     Plan:   - Pain: continue toradol q6hrs, PRN morphine  -  Headache: Imitrex IM x1  - Nausea: continue scheduled zofran, phenergan. Will add decadron 4mg  q6hrs    F: change to D5 1/2NS +20KCl at 73ml/hr  E: daily BMP, no electrolyte derangements  N: currently NPO, will advance to sips of clears as tolerated today  GI: IV protonix, add colace BID    Tubes/drains: none    DVT Prophylaxis: lovenox, encourage ambulation    Dispo: Likely home tomorrow pending progression of diet.       Shari Harvey 08/28/2016 5:43 AM  Medical Student, MS3    This is a medical student note. Please see provider note on same day for final assessment and plan

## 2016-08-28 NOTE — Progress Notes (Signed)
oob to BR ,unable to ambulate this am d/t nausea, team aware

## 2016-08-28 NOTE — Progress Notes (Signed)
08/28/16 0900   [facility] Patient Class Review   Patient Class Review Inpatient

## 2016-08-28 NOTE — Progress Notes (Addendum)
Bariatric Surgery Progress Note    SUBJECTIVE:  No acute events overnight.  Pain well controlled.  OOB yesterday.  Nausea overnight.  Denies fevers, chills,vomiting, shortness of breath and chest pain.     OBJECTIVE:    Vitals:    08/28/16 0542   BP: 124/64   Pulse: 95   Resp: 16   Temp: 37.8 C (100 F)   Weight:    Height:        Physical Examination:  General: Comfortable appearing pt in NAD  CV: RRR  Resp: CTA b/l  Abd: Soft, appropriately tender to palpation.  No distension. Incisions c/d/i.  Ext: WWP      Labs:  Recent Results (from the past 24 hour(s))   POCT glucose    Collection Time: 08/27/16 12:58 PM   Result Value Ref Range    Glucose POCT 68 60 - 99 mg/dL   POCT urine pregnancy    Collection Time: 08/27/16  1:13 PM   Result Value Ref Range    Preg Test,UR POC  Negative-Dilute urine specimens may cause false negative urine pregnancy results...     Negative-Dilute urine specimens may cause false negative urine pregnancy results...    INTERNAL CONTROL POCT URINE PREGNANCY *Yes-internal procedural control(s) acceptable     Exp date 09/01/2017     Lot # [id number]    POCT glucose    Collection Time: 08/28/16 12:22 AM   Result Value Ref Range    Glucose POCT 161 (H) 60 - 99 mg/dL       Intake/Output last 3 shifts:  I/O last 3 completed shifts:  03/25 2300 - 03/26 2259  In: 2425 (23.4 mL/kg) [I.V.:2425 (1 mL/kg/hr)]  Out: 620 (6 mL/kg) [Urine:600 (0.2 mL/kg/hr); Blood:20]  Net: 1805  Weight: 103.4 kg   Intake/Output this shift:  I/O this shift:  03/26 2300 - 03/27 0659  In: 1647.9 (15.9 mL/kg) [I.V.:1447.9; IV Piggyback:200]  Out: 900 (8.7 mL/kg) [Urine:900]  Net: 747.9  Weight: 103.4 kg   Active Hospital Medications:   enoxaparin  40 mg Subcutaneous Q12H    pantoprazole  40 mg Intravenous Daily with dinner    ketorolac  15 mg Intravenous Q6H    ampicillin-sulbactam IV  3,000 mg Intravenous Q6H    ondansetron  4 mg Intravenous Q4H    promethazine  25 mg Intravenous Q6H     morphine **OR** morphine,  hydrALAZINE    Radiology (last 3 days): No results found.    ASSESSMENT: This is a 41 y.o. female of Shari Harvey, Sol PasserWilliam E, MD POD#1 s/p laparoscopic sleeve gastrectomy    PLAN:     - F/E/N: Continue sips, sugar-free, non-carbonated liquids 4560ml/hr.  Gastric bypass 1 diet for dinner.  Continue D5 1/2 NS + 20KCl @75cc /hr per bariatric protocol   - Pain Control/Neuro: Pain adequately controlled on morphine 2mg  PRN pain. Adjust anti-emetics  - CV: Hemodynamically stable  - Resp: O2 sats acceptable.  Monitor sats >92%.  Continue to encourage incentive spirometer.  - GU: U/O acceptable  - DVT PPX  - PT/OT/Activity: OOB as tolerated  - Dispo: Continued inpatient status today.    Signed: Elfredia NevinsSamuel Hykin, MD  Note created: 08/28/2016  at: 6:43 AM     I saw and evaluated the patient. I agree with the resident's/fellow's findings and plan of care as documented above.    Darrell JewelWILLIAM E Ardie Dragoo, MD

## 2016-08-29 ENCOUNTER — Encounter: Payer: Self-pay | Admitting: Surgery

## 2016-08-29 LAB — SURGICAL PATHOLOGY

## 2016-08-29 MED ORDER — DOCUSATE SODIUM 100 MG PO CAPS *I*
100.0000 mg | ORAL_CAPSULE | Freq: Two times a day (BID) | ORAL | 0 refills | Status: AC
Start: 2016-08-29 — End: 2016-09-28

## 2016-08-29 MED ORDER — CHILDRENS MULTIVITAMIN 60 MG PO CHEW
1.0000 | CHEWABLE_TABLET | Freq: Two times a day (BID) | ORAL | 0 refills | Status: AC
Start: 2016-08-29 — End: 2016-09-28

## 2016-08-29 MED ORDER — ONDANSETRON HCL 4 MG PO TABS *I*
4.0000 mg | ORAL_TABLET | Freq: Three times a day (TID) | ORAL | 0 refills | Status: DC | PRN
Start: 2016-08-29 — End: 2017-09-16

## 2016-08-29 MED ORDER — POLYETHYLENE GLYCOL 3350 PO PACK 17 GM *I*
17.0000 g | PACK | Freq: Two times a day (BID) | ORAL | 0 refills | Status: AC
Start: 2016-08-29 — End: 2016-09-01

## 2016-08-29 MED ORDER — OXYCODONE-ACETAMINOPHEN 5-325 MG PO TABS *I*
1.0000 | ORAL_TABLET | ORAL | 0 refills | Status: DC | PRN
Start: 2016-08-29 — End: 2016-10-31

## 2016-08-29 MED ORDER — PROMETHAZINE HCL 25 MG PO TABS *I*
25.0000 mg | ORAL_TABLET | ORAL | 0 refills | Status: AC | PRN
Start: 2016-08-29 — End: ?

## 2016-08-29 MED ORDER — OMEPRAZOLE 20 MG PO CPDR *I*
20.0000 mg | DELAYED_RELEASE_CAPSULE | Freq: Every day | ORAL | 0 refills | Status: DC
Start: 2016-08-29 — End: 2016-10-10

## 2016-08-29 MED ORDER — SCOPOLAMINE BASE 1.5 MG TD PT72 *I*
1.0000 | MEDICATED_PATCH | TRANSDERMAL | 0 refills | Status: AC
Start: 2016-08-29 — End: 2016-09-28

## 2016-08-29 NOTE — Progress Notes (Signed)
Bariatric Surgery Daily Progress Note    SUBJECTIVE:  No acute events overnight.  Pain controlled.  Tolerated shake yesterday.  Ambulating.  Nausea improved from yesterday.  Denies fevers, chills, vomiting, shortness of breath and chest pain.     OBJECTIVE:    BP: (122-136)/(60-81)   Temp:  [36.2 C (97.2 F)-38 C (100.4 F)]   Temp src: Temporal (03/28 0449)  Heart Rate:  [77-101]   Resp:  [16-18]   SpO2:  [94 %-97 %]     Physical Examination:  General: Comfortable appearing pt in NAD  CV: RRR  Resp: CTA b/l  Abd: Soft, appropriately tender to palpation.  No distension. Incisions c/d/i.   Ext: WWP      Labs:  No results found for this or any previous visit (from the past 24 hour(s)).    Intake/Output last 3 shifts:  I/O last 3 completed shifts:  03/27 0700 - 03/28 0659  In: 1632.5 (15.8 mL/kg) [P.O.:540; I.V.:992.5 (0.4 mL/kg/hr); IV Piggyback:100]  Out: 1925 (18.6 mL/kg) [Urine:1925 (0.8 mL/kg/hr)]  Net: -292.5  Weight: 103.4 kg   Intake/Output this shift:     Active Hospital Medications:   scopolamine  1 patch Transdermal Q72H    pantoprazole  40 mg Oral Nightly    docusate sodium  100 mg Oral BID    ondansetron  4 mg Oral Q4H    promethazine  25 mg Oral Q6H    dexamethasone  4 mg Intravenous Q6H    mometasone  2 puff Inhalation QPM    loratadine  10 mg Oral Daily    montelukast  10 mg Oral Daily    sertraline  100 mg Oral Daily    topiramate  200 mg Oral Nightly    enoxaparin  40 mg Subcutaneous Q12H    ketorolac  15 mg Intravenous Q6H     albuterol HFA, clonazePAM, diphenhydrAMINE, SUMAtriptan, oxyCODONE-acetaminophen **OR** oxyCODONE-acetaminophen, hydrALAZINE    Radiology (last 3 days): No results found.    ASSESSMENT: This is a 41 y.o. female POD#2 s/p laparoscopic sleeve gastrectomy    PLAN:     - F/E/N: Continue gastric bypass 1 diet.  IVF d/c'ed last night per bariatric protocol  - Neuro: Pain adequately controlled on PO meds  - CV: Hemodynamically stable  - Resp: O2 sats acceptable.   Monitor sats >92%.  Continue to encourage incentive spirometer.  - GI: Will be d/c'ed on omeprazole 20mg  PO daily  - GU: U/O acceptable  - DVT PPX  - PT/OT/Activity: OOB  - Dispo: Pt is stable and ready to be discharged today.    Signed: Elfredia NevinsSamuel Holdyn Poyser, MD  Note created: 08/29/2016  at: 7:20 AM

## 2016-08-29 NOTE — Student Note (Signed)
Bariatric & GI Surgery Progress Note:  Medical Student Note    Shari Harvey 41 y.o. female/ [id number]    Interval History: NAE overnight    Subjective: In to see patient this morning. She states that her headache and nausea are improved since yesterday, which she attributes to the morphine. She was able to tolerate her protein shake yesterday evening without nausea, though she states that it took her ~1.5 hrs to finish it. She feels as though her belly is "very full", and she is bloated. She has not passed flatus or had a BM since Sunday. Normally, she takes ~4 colace per day at home to maintain daily bowel movements. She has been ambulating frequently without difficulty. She denies any abdominal pain, n/v, chest pain, SOB.     Objective  Vitals:  Vitals:    08/28/16 2140 08/29/16 0126 08/29/16 0449 08/29/16 0552   BP: 122/61 124/65 136/81    BP Location: Right arm Left arm Left arm    Pulse: 91 77 80    Resp: 16 16 16     Temp: 37.7 C (99.9 F) 36.5 C (97.7 F) 36.2 C (97.2 F)    TempSrc: Temporal Temporal Temporal    SpO2: 96% 94% 97% 97%   Weight:       Height:           Physical Exam:  General: Awake, comfortable.   HEENT: NCAT, oral mucosa moist, no rashes or petechiae, anicteric  CV: RRR, nl S1/S2, no murmurs, rubs, or gallop    Pulm: Clear to auscultation in anterior lung fields, no wheezes or rales. No increased WOB appreciated.  GI/GU: Non-distended, (+) bowel sounds, soft, non-tender to palpation. Incisions c/d/i  Ext: Warm, no LEE  Neuro: Face symmetric. Moving all extremities. Speech in tact.     Labs:   - no new labs today      Date 08/28/16 0700 - 08/29/16 0659 08/29/16 0700 - 08/30/16 0659   Shift 0700-0659 24 Hour Total 0700-0659 24 Hour Total   I  N  T  A  K  E   P.O. 540 540        P.O. 540 540      I.V.  (mL/kg/hr) 992.5 992.5        Volume (mL) (Dextrose 5% in lactated ringers with KCl 20 mEq per liter) 125 125        Volume (mL) (dextrose 5 % and 0.45 % NaCl with KCl 20 mEq per liter)  867.5 867.5      IV Piggyback 100 100        Volume (mL) (ampicillin-sulbactam (UNASYN) 30 mg/ml IVPB 3,000 mg) 100 100      Shift Total  (mL/kg) 1632.5  (15.8) 1632.5  (15.8)     O  U  T  P  U  T   Urine  (mL/kg/hr) 1925 1925        Urine 1925 1925      Shift Total  (mL/kg) 1925  (18.6) 1925  (18.6)     NET -292.5 -292.5     Weight (kg) 103.4 103.4 103.4 103.4     Radiology:  - no new imaging    Assessment: Shari Harvey is a 41 y.o. female with a history of OSA, GERD, and HLD POD #2 s/p laparoscopic gastric sleeve. She was able to tolerate protein shake well overnight without nausea. Has not passed flatus or had BM.     Plan:   -  Pain: percocet PRN  - Nausea: continue scheduled zofran, phenergan, decadron in house. D/c with zofran PRN    F: no mIVF  E:  No labs  N: bariatric sleeve diet, discussed with nutrition   GI: IV protonix, colace BID    Tubes/drains: none    DVT Prophylaxis: lovenox, encourage ambulation    Dispo: D/c home today.       Shari Harvey 08/29/2016 5:56 AM  Medical Student, MS3    This is a medical student note. Please see provider note on same day for final assessment and plan

## 2016-08-29 NOTE — Discharge Instructions (Signed)
Brief Summary of Your Hospital Stay (including key procedures and diagnostic test results):   You underwent a laparoscopic sleeve gastrectomy. Your post-operative course was uneventful and you were discharged home.  Please pay close attention to the medications listed below and DO NOT take any medications that are not listed.  If you are a diabetic, please check your blood sugars as instructed.  Call if they are < 80 or > 180.  Your pain medication also contains acetaminophen.  Do not take any acetaminophen containing products (i.e. Tylenol) while taking your prescriptive pain medication.      Follow up: 09/05/16 2:00 pm with [facility] Surgery [facility]: Phone # [phone]    Diet: Gastric Bypass Stage I diet. If you misplace your handout or would like to review the post-operative diets at any time online, you may visit our website:    [email or web address]    Drain Care: If you have a JP drain, empty and record output as instructed.  Please bring record to your follow up appointment.    Wound Care: You may take the band aids off when you get home.  You may shower.  No tub baths, swimming, or hot tubs.    Notify physician if you notice these problems: Phone # [phone]    Fever greater than 101 degrees Fahrenheit  Chills  Persistent nausea or vomiting not controlled with oral anti-emetic  Increasing abdominal pain  Redness, drainage, or opening of incision  Chest pain  Shortness of breath  Swelling or pain of extremities  Dizziness upon standing    Please take the Colace as listed below.  This is available over the counter.  If you are unable to have daily or every other day bowel movements on the Colace alone, please add Milk of Magnesia, 30 cc, at bedtime daily until result.      Activity:   No lifting greater than 10 lbs.   You may walk and it is encouraged as much as possible.   No strenuous exercise until follow up.   Do not drive while taking narcotic  pain medication.

## 2016-08-29 NOTE — Discharge Summary (Addendum)
Name: Shari Harvey MRN: [id number] DOB: 12/22/1975     Admit Date: 08/27/2016   Date of Discharge: 08/29/2016    Patient was accepted for discharge to   Home or Self Care [1]           Discharge Attending Physician: Darrell JewelMALLEY, Ronnetta Currington E      Hospitalization Summary    CONCISE NARRATIVE:   Shari Harvey is a 41 y.o. female who came to [facility] for a laparoscopic gastric sleeve on 08/27/16. The procedure was completed as planned and she  was transferred to the floor for postoperative management. The postoperative course was uneventful and she  was advanced according to bariatric protocol to a gastric bypass 1 diet. She was discharged on POD2 in a stable condition, tolerating a gastric bypass 1 diet and pain controlled.     Shari Harvey has instructions to follow up with the Bariatric Clinic in 1 week.        OR PROCEDURE: Laparoscopic gastric sleeve  SIGNIFICANT MED CHANGES: Yes  See med rec      Signed: Elfredia NevinsSamuel Hykin, MD  On: 08/29/2016  at: 7:27 AM

## 2016-09-05 ENCOUNTER — Encounter: Payer: Self-pay | Admitting: Surgery

## 2016-09-05 ENCOUNTER — Ambulatory Visit: Payer: PRIVATE HEALTH INSURANCE | Attending: Surgery | Admitting: Surgery

## 2016-09-05 VITALS — BP 122/72 | HR 80 | Temp 96.8°F | Resp 18 | Ht 67.0 in | Wt 218.4 lb

## 2016-09-05 DIAGNOSIS — K909 Intestinal malabsorption, unspecified: Secondary | ICD-10-CM

## 2016-09-05 DIAGNOSIS — Z6834 Body mass index (BMI) 34.0-34.9, adult: Secondary | ICD-10-CM

## 2016-09-05 DIAGNOSIS — Z6831 Body mass index (BMI) 31.0-31.9, adult: Secondary | ICD-10-CM | POA: Insufficient documentation

## 2016-09-05 LAB — COMPREHENSIVE METABOLIC PANEL
ALT: 22 U/L (ref 0–35)
AST: 22 U/L (ref 0–35)
Albumin: 4.3 g/dL (ref 3.5–5.2)
Alk Phos: 88 U/L (ref 35–105)
Anion Gap: 16 (ref 7–16)
Bilirubin,Total: 0.3 mg/dL (ref 0.0–1.2)
CO2: 21 mmol/L (ref 20–28)
Calcium: 10 mg/dL (ref 8.8–10.2)
Chloride: 104 mmol/L (ref 96–108)
Creatinine: 0.82 mg/dL (ref 0.51–0.95)
GFR,Black: 103 *
GFR,Caucasian: 89 *
Globulin: 3 g/dL (ref 2.7–4.3)
Glucose: 81 mg/dL (ref 60–99)
Lab: 19 mg/dL (ref 6–20)
Potassium: 4.6 mmol/L (ref 3.3–5.1)
Sodium: 141 mmol/L (ref 133–145)
Total Protein: 7.3 g/dL (ref 6.3–7.7)

## 2016-09-05 LAB — CBC
Hematocrit: 45 % (ref 34–45)
Hemoglobin: 15.5 g/dL (ref 11.2–15.7)
MCH: 31 pg (ref 26–32)
MCHC: 34 g/dL (ref 32–36)
MCV: 89 fL (ref 79–95)
Platelets: 459 10*3/uL — ABNORMAL HIGH (ref 160–370)
RBC: 5.1 MIL/uL (ref 3.9–5.2)
RDW: 11.9 % (ref 11.7–14.4)
WBC: 10.4 10*3/uL — ABNORMAL HIGH (ref 4.0–10.0)

## 2016-09-05 LAB — PTH, INTACT: Intact PTH: 30 pg/mL (ref 15.0–65.0)

## 2016-09-05 LAB — FOLATE: Folate: >20 ng/mL (ref >=4.6)

## 2016-09-05 LAB — VITAMIN B12: Vitamin B12: 1498 pg/mL — ABNORMAL HIGH (ref 232–1245)

## 2016-09-05 LAB — IRON: Iron: 61 ug/dL (ref 34–165)

## 2016-09-05 LAB — FERRITIN: Ferritin: 120 ng/mL (ref 10–120)

## 2016-09-05 MED ORDER — URSODIOL 300 MG PO CAPS *I*
300.0000 mg | ORAL_CAPSULE | Freq: Two times a day (BID) | ORAL | 5 refills | Status: DC
Start: 2016-09-05 — End: 2016-09-20

## 2016-09-05 NOTE — Progress Notes (Signed)
[facility] at [facility]  Postoperative Visit  GP1    Date of Visit: 09/05/2016   Patient: Shari Harvey   MR Number: [id number]   Date of Birth: 1976-04-24   Date of Surgery: 08/27/2016   Type of Surgery: Laparoscopic Sleeve Gastrectomy         Surgeon: Sol Passer. Gershon Mussel, M.D., F.A.C.S.       Bariatric Vitals:  Vitals 09/05/2016 08/27/2016 08/24/2016 08/17/2016 04/25/2016   Preop Weight 235 lb - - 235 lb 235 lb   Weight 218 lb 6.4 oz 228 lb 225 lb 228 lb 9.6 oz 233 lb 3.2 oz   BAR Weight (lbs) - - - 228.6 233.2   Height    5' 7.008" 5' 7.008"   BAR Height (in) - - - 67.01 67.01   BMI (Calculated) 34.3 kg/m2 35.8 kg/m2 35.3 kg/m2 35.9 kg/m2 36.6 kg/m2   BAR BMI (Calculated) - - - 35.9 kg/m2 36.6 kg/m2   IBW in kg (Bariatric) 61.24 61.24 61.24 61.25 61.25   IBW in lbs (Bariatric) 135 135 135 135.04 135.04   Percent Excess Weight Loss -16.61 % -6.99 % -9.99 % -6.4 % -1.81 %   Weight Change for Visit (kg) -4.35 1.36 -1.63 -2.09 -0.27   Weight Change Total (kg) -7.53 -3.17 -4.53 -2.9 -0.82   Initial Excess Weight 45.33 45.33 45.33 45.32 45.32   Total Weight Loss Percent -7.06 % -2.98 % -4.26 % -2.72 % -0.77 %     Vitals 09/05/2016 08/29/2016 08/29/2016 08/29/2016 08/28/2016   BP 122/72 130/82 136/81 124/65 122/61   Pulse 80 82 80 77 91   Resp Temp 96.8 98.8 97.2 97.7 99.9       ACS Information:  ACS 09/05/2016 09/05/2016 08/27/2016 08/27/2016 08/24/2016   Weight Change Since Preop -7.53 - -3.17 - -4.53   DIAGNOSES THIS VISIT - BMI 34.0-34.9,adult  Intestinal malabsorption, unspecified type - - -   TREATMENTS - - - - -       Subjective:  Patient returns for a standard post operative evaluation. A review of pertinent symptoms reveals:  Loralei R Mochizuki complains of constipation  Pertinent negatives include abdominal pain, nausea, heartburn or reflux, shortness of breath and vomiting      Co-Morbid Conditions:  These comorbid conditions existed prior to surgery: obstructive sleep apnea,  osteoarthritis, GERD, asthma, anxiety  .    Patient does have sleep apnea and is compliant with using CPAP  Patient does not have diabetes.   Patient does not have hypertension treated with medication.  Patient does not have GERD treated with PPI or H2  Patient does not have hyperlipidemia treated with medication    Complications:  Patient was not diagnosed with venous thrombosis postoperatively.  Patient was not diagnosed with a pulmonary emobolism postoperatively.  Patient does not have a new incisional hernia    Current Medications, Vitamins, and Supplements list:  Medications/Vitamins/Supplements         Last Dose Start Date End Date Provider     acyclovir (ZOVIRAX) 400 MG tablet Not Taking  05/31/15  --  [provider]     Take 400 mg by mouth 3 times daily as needed     albuterol HFA 108 (90 BASE) MCG/ACT inhaler Not Taking  --  --  [provider]     Inhale 2 puffs into the lungs every 6 hours as needed for Wheezing   Shake well before  each use.     beclomethasone (QVAR) 80 MCG/ACT inhaler Not Taking  07/05/15  --  Tyna Jaksch, NP     Inhale 1 puff into the lungs 2 times daily   Shake well before each use.     calcium citrate-vitamin D (CALCIUM CITRATE + D) 315-250 MG-UNIT per tablet Not Taking  --  --  [provider]     Take 2 tablets by mouth 2 times daily     clonazePAM (KLONOPIN) 0.5 MG tablet Taking  06/13/15  --  [provider]     Take 0.5 mg by mouth 2 times daily as needed   for anxiety     desloratadine (CLARINEX) 5 MG tablet Taking  --  --  [provider]     Take 5 mg by mouth as needed        diphenhydrAMINE (BENADRYL) 50 MG tablet Not Taking  --  --  [provider]     Take 50 mg by mouth nightly as needed for Itching     docusate sodium (COLACE) 100 MG capsule Taking  08/29/16  09/28/16  Elfredia Nevins, MD     Take 1 capsule (100 mg total) by mouth 2 times daily   Hold for loose stools     generic DME Not Taking  11/25/12  --   Hubert Azure, PA     Lateral unloader brace.     Mag Aspart-Potassium Aspart 250-250 MG CAPS Not Taking  --  --  [provider]     Take by mouth as needed        MELATONIN PO Not Taking  --  --  [provider]     Take 3 mg by mouth as needed        montelukast (SINGULAIR) 10 MG tablet Taking  03/04/15  --  [provider]     Take 10 mg by mouth daily     omeprazole (PRILOSEC) 20 MG capsule Taking  08/29/16  09/28/16  Elfredia Nevins, MD     Take 1 capsule (20 mg total) by mouth daily (before breakfast)     ondansetron (ZOFRAN) 4 MG tablet Not Taking  08/29/16  --  Elfredia Nevins, MD     Take 1 tablet (4 mg total) by mouth 3 times daily as needed for Vomiting     oxyCODONE-acetaminophen (PERCOCET) 5-325 MG per tablet Taking  08/29/16  --  Elfredia Nevins, MD     Take 1-2 tablets by mouth every 4-6 hours as needed for Pain   Max daily dose: 8 tablets     Pediatric Multivit-Minerals-C (CHILDRENS MULTIVITAMIN) 60 MG CHEW Taking  08/29/16  09/28/16  Elfredia Nevins, MD     Take 1 tablet by mouth 2 times daily     promethazine (PHENERGAN) 25 MG tablet Not Taking  08/29/16  --  Elfredia Nevins, MD     Take 1 tablet (25 mg total) by mouth every 4-6 hours as needed for Vomiting     RECTIV 0.4 % ointment Taking  10/18/15  --  [provider]     as needed        scopolamine (TRANSDERM-SCOP) 1 MG/3DAYS patch Not Taking  08/29/16  09/28/16  Elfredia Nevins, MD     Place 1 patch (1.5 mg total) onto the skin every 72 hours   Remove & discard patch after 72 hours.     sertraline (ZOLOFT) 50 MG tablet Taking  --  --  [provider]     Take 100 mg by mouth daily        SUMAtriptan (IMITREX) 100 MG tablet Not Taking  --  --  [provider]     Take 100 mg by mouth as needed for Migraine   Take at onset of headache. May repeat once in 2 hours.     topiramate (TOPAMAX) 200 MG tablet Taking  09/28/15  --  [provider]     Take 200 mg by mouth nightly        ursodiol  (ACTIGALL) 300 MG capsule   09/05/16  10/05/16  Elfredia Nevins, MD     Take 1 capsule (300 mg total) by mouth 2 times daily          Social History:  Tamani R Calabrese  reports that she quit smoking about 13 years ago. She has never used smokeless tobacco.   She  reports that she does not drink alcohol.  Inanna reports she does not use NSAIDS.  She does not attend support meetings.  Osmara exercises 1 times a week.    Diet History:  Josette R Roldan does adhere to the current meal plan.  Shreena is using protein supplements.  She does not use caffeine.    Physical Exam:  Physical Exam   Constitutional: She is oriented to person, place, and time. She appears well-developed and well-nourished. No distress.   HENT:   Head: Normocephalic and atraumatic.   Eyes: Pupils are equal, round, and reactive to light. Right eye exhibits no discharge. Left eye exhibits no discharge. No scleral icterus.   Neck: Normal range of motion. No JVD present. No tracheal deviation present.   Cardiovascular: Normal rate, regular rhythm and normal heart sounds.  Exam reveals no gallop and no friction rub.    No murmur heard.  Pulmonary/Chest: Effort normal and breath sounds normal. No stridor. No respiratory distress.   Abdominal: Soft. Bowel sounds are normal. She exhibits no distension. There is no tenderness. There is no rebound.   Musculoskeletal: Normal range of motion. She exhibits no edema.   Neurological: She is alert and oriented to person, place, and time.   Skin: Skin is warm and dry. She is not diaphoretic.        Assessment:  Patient is without complaints.  Post op course has been uneventful.  Weight loss has been average.  She is compliant with diet and protein supplements.  Pathology: yes: x FINAL DIAGNOSIS:   Stomach, partial gastrectomy:   - Gastric body with no specific pathologic change.   - Helicobacter organisms are not identified on routine H&E sections.  No results found.  In addition: Not Applicable       Plan:  Ardie  R Shawley was educated on Diet: pureed  Protein: current regimen  Exercise: Increase exercise as tolerated.     Orders Placed This Encounter   Procedures    Folate    Iron    Vitamin B12    Vitamin d 25 hydroxy, d2&d3    Ferritin    CBC    Comprehensive metabolic panel    PTH, intact       RTW 09/13/16

## 2016-09-10 LAB — VITAMIN D
25-OH VIT D2: <4 ng/mL
25-OH VIT D3: 37 ng/mL
25-OH Vit Total: 37 ng/mL (ref 30–60)

## 2016-09-20 ENCOUNTER — Other Ambulatory Visit: Payer: Self-pay | Admitting: Surgery

## 2016-09-20 MED ORDER — URSODIOL 300 MG PO CAPS *I*
300.0000 mg | ORAL_CAPSULE | Freq: Two times a day (BID) | ORAL | 1 refills | Status: DC
Start: 2016-09-20 — End: 2017-09-16

## 2016-09-25 ENCOUNTER — Telehealth: Payer: Self-pay | Admitting: Surgery

## 2016-09-25 NOTE — Telephone Encounter (Signed)
WO SLEEVE 08/27/16    Patient called in with c/o constipation. Wants to know if she can take the fiber supplements. Asked for a call back at [phone]

## 2016-09-25 NOTE — Telephone Encounter (Signed)
Left message

## 2016-09-26 NOTE — Telephone Encounter (Signed)
Pt called and instructed per PA

## 2016-09-26 NOTE — Telephone Encounter (Signed)
Spoke to pt yesterday afternoon, called concerned with chronic constipation that has worsened after her gastric Sleeve 08/26/2016  Pt is pass gas regularly but is only having hard small bowel movements unresolved with colace BID, Mirlax BID and MOM x1  States that prior to surgery fiber gummies, helped her. Would like to know if she can continue to take the gummies ?   She has also tried senna, and "smooth move" tea   She states that she has never seen GI or had a colonoscopy.      Last bowel movement was 4/22    Denies fever, chills, shortness of breath, nausea or vomiting     Encouraged that she increase her hydration to 64oz daily  Glycerin suppositories

## 2016-10-03 ENCOUNTER — Encounter: Payer: Self-pay | Admitting: Registered"

## 2016-10-03 ENCOUNTER — Ambulatory Visit: Payer: PRIVATE HEALTH INSURANCE | Attending: Surgery | Admitting: Registered"

## 2016-10-03 DIAGNOSIS — E669 Obesity, unspecified: Secondary | ICD-10-CM | POA: Insufficient documentation

## 2016-10-03 DIAGNOSIS — Z6832 Body mass index (BMI) 32.0-32.9, adult: Secondary | ICD-10-CM | POA: Insufficient documentation

## 2016-10-03 NOTE — Progress Notes (Signed)
Date of Visit: 10/03/2016   Patient: Shari Harvey   MR Number: [id number]   Date of Birth: Oct 12, 1975   Date of Surgery: 08/27/2016   Type of Surgery: Laparoscopic Sleeve Gastrectomy         Surgeon: Sol Passer. Gershon Mussel, M.D., F.A.C.S.       Bariatric Vitals:  Vitals 10/03/2016 09/05/2016 08/27/2016 08/24/2016 08/17/2016   Preop Weight 235 lb 235 lb - - 235 lb   Weight 206 lb 9.6 oz 218 lb 6.4 oz 228 lb 225 lb 228 lb 9.6 oz   BAR Weight (lbs) - - - - 228.6   Height     5' 7.008"   BAR Height (in) - - - - 67.01   BMI (Calculated) 32.4 kg/m2 34.3 kg/m2 35.8 kg/m2 35.3 kg/m2 35.9 kg/m2   BAR BMI (Calculated) - - - - 35.9 kg/m2   IBW in kg (Bariatric) 61.24 61.24 61.24 61.24 61.25   IBW in lbs (Bariatric) 135 135 135 135 135.04   Percent Excess Weight Loss -28.41 % -16.61 % -6.99 % -9.99 % -6.4 %   Weight Change for Visit (kg) -5.35 -4.35 1.36 -1.63 -2.09   Weight Change Total (kg) -12.88 -7.53 -3.17 -4.53 -2.9   Initial Excess Weight 45.33 45.33 45.33 45.33 45.32   Total Weight Loss Percent -12.09 % -7.06 % -2.98 % -4.26 % -2.72 %     Vitals 10/03/2016 09/05/2016 08/29/2016 08/29/2016 08/29/2016   BP 126/76 122/72 130/82 136/81 124/65   Pulse 76 80 82 80 77   Resp Temp 95.7 96.8 98.8 97.2 97.7       ACS Information:  ACS 10/03/2016 09/05/2016 09/05/2016 08/27/2016 08/27/2016   Weight Change Since Preop -12.88 -7.53 - -3.17 -   DIAGNOSES THIS VISIT - - BMI 34.0-34.9,adult  Intestinal malabsorption, unspecified type - -   TREATMENTS - - - - -       Current Medications, Vitamins, and Supplements list:  Medications/Vitamins/Supplements         Last Dose Start Date End Date Provider     acyclovir (ZOVIRAX) 400 MG tablet Taking  05/31/15  --  [provider]     Take 400 mg by mouth 3 times daily as needed     albuterol HFA 108 (90 BASE) MCG/ACT inhaler Taking  --  --  [provider]     Inhale 2 puffs into the lungs every 6 hours as needed for Wheezing   Shake well before each use.      beclomethasone (QVAR) 80 MCG/ACT inhaler Taking  07/05/15  --  Shults, Antonietta Jewel, NP     Inhale 1 puff into the lungs 2 times daily   Shake well before each use.     calcium citrate-vitamin D (CALCIUM CITRATE + D) 315-250 MG-UNIT per tablet Taking  --  --  [provider]     Take 2 tablets by mouth 2 times daily     clonazePAM (KLONOPIN) 0.5 MG tablet Taking  06/13/15  --  [provider]     Take 0.5 mg by mouth 2 times daily as needed   for anxiety     desloratadine (CLARINEX) 5 MG tablet Taking  --  --  [provider]     Take 5 mg by mouth as needed        diphenhydrAMINE (BENADRYL) 50 MG tablet Taking  --  --  [provider]  Take 50 mg by mouth nightly as needed for Itching     generic DME Taking  11/25/12  --  Hubert Azure, PA     Lateral unloader brace.     Mag Aspart-Potassium Aspart 250-250 MG CAPS Taking  --  --  [provider]     Take by mouth as needed        MELATONIN PO Taking  --  --  [provider]     Take 3 mg by mouth as needed        montelukast (SINGULAIR) 10 MG tablet Taking  03/04/15  --  [provider]     Take 10 mg by mouth daily     omeprazole (PRILOSEC) 20 MG capsule Taking  08/29/16  10/03/16  Elfredia Nevins, MD     Take 1 capsule (20 mg total) by mouth daily (before breakfast)     ondansetron (ZOFRAN) 4 MG tablet Not Taking  08/29/16  --  Elfredia Nevins, MD     Take 1 tablet (4 mg total) by mouth 3 times daily as needed for Vomiting     oxyCODONE-acetaminophen (PERCOCET) 5-325 MG per tablet Taking  08/29/16  --  Elfredia Nevins, MD     Take 1-2 tablets by mouth every 4-6 hours as needed for Pain   Max daily dose: 8 tablets     promethazine (PHENERGAN) 25 MG tablet Taking  08/29/16  --  Elfredia Nevins, MD     Take 1 tablet (25 mg total) by mouth every 4-6 hours as needed for Vomiting     RECTIV 0.4 % ointment Taking  10/18/15  --  [provider]     as needed        sertraline (ZOLOFT) 50 MG tablet  Taking  --  --  [provider]     Take 100 mg by mouth daily        SUMAtriptan (IMITREX) 100 MG tablet Taking  --  --  [provider]     Take 100 mg by mouth as needed for Migraine   Take at onset of headache. May repeat once in 2 hours.     topiramate (TOPAMAX) 200 MG tablet Taking  09/28/15  --  [provider]     Take 200 mg by mouth nightly        ursodiol (ACTIGALL) 300 MG capsule Taking  09/20/16  10/20/16  Rolland Porter, PA     Take 1 capsule (300 mg total) by mouth 2 times daily          Have you been to a hospital or seen by a doctor for any reason since your surgery? No   Were you seen at anywhere other than [facility] or [facility]? No  Was this visit for anything other than routine follow-up? N/A    Review of Systems:  [name] complains of no issues  Pertinent negatives include none applicable    She does have these associated symptoms obstructive sleep apnea, osteoarthritis, GERD, asthma, anxiety  .    Excercise History:  Shari Harvey exercises 3 times a week.    Diet History:  Shari Harvey does adhere to the current meal plan.  Shari Harvey is using protein supplements.  She does not use caffeine.    Assessment:  Patient is without complaints.  Post op course has been uneventful.  Weight loss has been excellent.  She is compliant with diet, protein supplements and  multivitamin/mineral and calcium citrate.  Patient will add B12 and thiamine..  In addition: Not Applicable     Plan:  [name] was educated on Diet: soft food  Protein: decrease supplements   Exercise: Increase exercise as tolerated.  Follow up in 4 week(s)

## 2016-10-08 ENCOUNTER — Telehealth: Payer: Self-pay | Admitting: Surgery

## 2016-10-08 NOTE — Telephone Encounter (Signed)
Left message

## 2016-10-08 NOTE — Telephone Encounter (Signed)
WO S/P  SLEEVE 08/27/16    Patient called with concerns of pain and feeling like gas is trapped. Says that she has symptoms of when she initially had the surgery . Pt requested a call back    [phone]

## 2016-10-09 NOTE — Telephone Encounter (Signed)
Spoke to pt this morning who called concerned about worsening abdominal pain since Saturday. Describes the pain as aching pressure, constant only resolved when she takes a Norco. Pt feels naseuous at times, with out an appetite.   Reports that she is passing gas and having regular bowel movements.   She has tried a soft diet to see if that relieves some of the discomfort, however it has not.   Pt is taking all her medications as prescribed.   Pt is requesting to be seen.

## 2016-10-10 ENCOUNTER — Ambulatory Visit: Payer: PRIVATE HEALTH INSURANCE | Admitting: Registered"

## 2016-10-10 ENCOUNTER — Encounter: Payer: Self-pay | Admitting: Surgery

## 2016-10-10 ENCOUNTER — Ambulatory Visit: Payer: PRIVATE HEALTH INSURANCE | Attending: Surgery | Admitting: Surgery

## 2016-10-10 VITALS — BP 122/83 | HR 95 | Temp 97.5°F | Resp 18 | Ht 67.0 in | Wt 203.2 lb

## 2016-10-10 DIAGNOSIS — R109 Unspecified abdominal pain: Secondary | ICD-10-CM | POA: Insufficient documentation

## 2016-10-10 DIAGNOSIS — Z9884 Bariatric surgery status: Secondary | ICD-10-CM

## 2016-10-10 LAB — HEPATIC FUNCTION PANEL
ALT: 14 U/L (ref 0–35)
AST: 18 U/L (ref 0–35)
Albumin: 3.8 g/dL (ref 3.5–5.2)
Alk Phos: 92 U/L (ref 35–105)
Bilirubin,Direct: <0.2 mg/dL (ref 0.0–0.3)
Bilirubin,Total: 0.3 mg/dL (ref 0.0–1.2)
Globulin: 2.9 g/dL (ref 2.7–4.3)
Total Protein: 6.7 g/dL (ref 6.3–7.7)

## 2016-10-10 LAB — BASIC METABOLIC PANEL
Anion Gap: 14 (ref 7–16)
CO2: 20 mmol/L (ref 20–28)
Calcium: 9.3 mg/dL (ref 8.8–10.2)
Chloride: 106 mmol/L (ref 96–108)
Creatinine: 0.71 mg/dL (ref 0.51–0.95)
GFR,Black: 122 *
GFR,Caucasian: 106 *
Glucose: 82 mg/dL (ref 60–99)
Lab: 12 mg/dL (ref 6–20)
Potassium: 3.9 mmol/L (ref 3.3–5.1)
Sodium: 140 mmol/L (ref 133–145)

## 2016-10-10 LAB — CBC
Hematocrit: 41 % (ref 34–45)
Hemoglobin: 13.8 g/dL (ref 11.2–15.7)
MCH: 31 pg (ref 26–32)
MCHC: 34 g/dL (ref 32–36)
MCV: 90 fL (ref 79–95)
Platelets: 339 10*3/uL (ref 160–370)
RBC: 4.5 MIL/uL (ref 3.9–5.2)
RDW: 12.4 % (ref 11.7–14.4)
WBC: 13.4 10*3/uL — ABNORMAL HIGH (ref 4.0–10.0)

## 2016-10-10 MED ORDER — SUCRALFATE 1 GM/10ML PO SUSP *I*
1.0000 g | Freq: Four times a day (QID) | ORAL | 0 refills | Status: DC
Start: 2016-10-10 — End: 2017-09-16

## 2016-10-10 MED ORDER — OMEPRAZOLE 20 MG PO CPDR *I*
20.0000 mg | DELAYED_RELEASE_CAPSULE | Freq: Every day | ORAL | 0 refills | Status: DC
Start: 2016-10-10 — End: 2017-09-16

## 2016-10-10 NOTE — Progress Notes (Signed)
Patient presents 6 weeks s/p sleeve with epigastric/RUQ pain for the last 5 days.  She states that it comes and goes.  It is worse after meals and right before meals.  She has some associated bloating sensation, but is passing flatus and having normal bowel movements.  She has had some nausea with the pain, but no vomiting.  She had a similar pain in the past which was attributed to gastritis for which she was placed on a PPI which resolved her symptoms.  She denies fevers, chills, shortness of breath and chest pain    Vitals:    10/10/16 1147   BP: 122/83   Pulse: 95   Resp: 18   Temp: 36.4 C (97.5 F)   Weight: 92.2 kg (203 lb 3.2 oz)   Height: 1.702 m (5\' 7" )     NAD  NCAT  RRR  CTAB  Soft, non-distended, mildly tender in epigastric region.  No Murphy's    A/p 41 year old female s/p sleeve with epigastric/RUQ pain.  Will obtain lab work including LFTs and U/s to r/o gallstones.  Will increase PPI to bid and start carafate.  Plan to see back in 2 weeks.  If work up negative and pain persists will consider EGD to r/o ulcer.

## 2016-10-11 ENCOUNTER — Telehealth: Payer: Self-pay | Admitting: Surgery

## 2016-10-11 ENCOUNTER — Ambulatory Visit
Admission: RE | Admit: 2016-10-11 | Discharge: 2016-10-11 | Disposition: A | Discharge status: Home / Self Care | Payer: PRIVATE HEALTH INSURANCE | Source: Ambulatory Visit | Attending: Surgery | Admitting: Surgery

## 2016-10-11 ENCOUNTER — Encounter: Payer: Self-pay | Admitting: Surgery

## 2016-10-11 DIAGNOSIS — R109 Unspecified abdominal pain: Secondary | ICD-10-CM | POA: Insufficient documentation

## 2016-10-11 DIAGNOSIS — K8020 Calculus of gallbladder without cholecystitis without obstruction: Secondary | ICD-10-CM | POA: Insufficient documentation

## 2016-10-11 DIAGNOSIS — R160 Hepatomegaly, not elsewhere classified: Secondary | ICD-10-CM

## 2016-10-11 DIAGNOSIS — D72829 Elevated white blood cell count, unspecified: Secondary | ICD-10-CM | POA: Insufficient documentation

## 2016-10-11 NOTE — Telephone Encounter (Signed)
Calling for her lab results. Pain was much worse overnight. Called Imaging this morning to try to get US today, but they are booking into June. US is ordered as a doppler US abdomen...is that correct? Are we able to call and get her in sooner for US? Please call.

## 2016-10-11 NOTE — Addendum Note (Signed)
Addended by: Elfredia NevinsHYKIN, Taler Kushner on: 10/11/2016 08:52 AM     Modules accepted: Orders

## 2016-10-12 ENCOUNTER — Encounter: Payer: Self-pay | Admitting: Surgery

## 2016-10-12 ENCOUNTER — Ambulatory Visit: Payer: PRIVATE HEALTH INSURANCE | Admitting: Surgery

## 2016-10-12 ENCOUNTER — Telehealth: Payer: Self-pay | Admitting: Surgery

## 2016-10-12 ENCOUNTER — Ambulatory Visit
Admission: RE | Admit: 2016-10-12 | Discharge: 2016-10-12 | Disposition: A | Discharge status: Home / Self Care | Payer: PRIVATE HEALTH INSURANCE | Source: Ambulatory Visit | Attending: Surgery | Admitting: Surgery

## 2016-10-12 VITALS — BP 122/75 | HR 97 | Temp 96.4°F | Resp 18 | Ht 67.0 in | Wt 200.4 lb

## 2016-10-12 DIAGNOSIS — R1013 Epigastric pain: Secondary | ICD-10-CM | POA: Insufficient documentation

## 2016-10-12 DIAGNOSIS — D72829 Elevated white blood cell count, unspecified: Secondary | ICD-10-CM | POA: Insufficient documentation

## 2016-10-12 DIAGNOSIS — Z6831 Body mass index (BMI) 31.0-31.9, adult: Secondary | ICD-10-CM

## 2016-10-12 DIAGNOSIS — R109 Unspecified abdominal pain: Secondary | ICD-10-CM

## 2016-10-12 DIAGNOSIS — N838 Other noninflammatory disorders of ovary, fallopian tube and broad ligament: Secondary | ICD-10-CM | POA: Insufficient documentation

## 2016-10-12 LAB — CBC
Hematocrit: 43 % (ref 34–45)
Hemoglobin: 14.5 g/dL (ref 11.2–15.7)
MCH: 31 pg (ref 26–32)
MCHC: 34 g/dL (ref 32–36)
MCV: 91 fL (ref 79–95)
Platelets: 385 10*3/uL — ABNORMAL HIGH (ref 160–370)
RBC: 4.7 MIL/uL (ref 3.9–5.2)
RDW: 12.5 % (ref 11.7–14.4)
WBC: 9.3 10*3/uL (ref 4.0–10.0)

## 2016-10-12 LAB — BASIC METABOLIC PANEL
Anion Gap: 15 (ref 7–16)
CO2: 18 mmol/L — ABNORMAL LOW (ref 20–28)
Calcium: 9.7 mg/dL (ref 8.8–10.2)
Chloride: 107 mmol/L (ref 96–108)
Creatinine: 0.76 mg/dL (ref 0.51–0.95)
GFR,Black: 113 *
GFR,Caucasian: 98 *
Glucose: 81 mg/dL (ref 60–99)
Lab: 17 mg/dL (ref 6–20)
Potassium: 4.5 mmol/L (ref 3.3–5.1)
Sodium: 140 mmol/L (ref 133–145)

## 2016-10-12 LAB — HEPATIC FUNCTION PANEL
ALT: 22 U/L (ref 0–35)
AST: 21 U/L (ref 0–35)
Albumin: 3.8 g/dL (ref 3.5–5.2)
Alk Phos: 101 U/L (ref 35–105)
Bilirubin,Direct: <0.2 mg/dL (ref 0.0–0.3)
Bilirubin,Total: 0.3 mg/dL (ref 0.0–1.2)
Globulin: 3.2 g/dL (ref 2.7–4.3)
Total Protein: 7 g/dL (ref 6.3–7.7)

## 2016-10-12 LAB — LIPASE: Lipase: 29 U/L (ref 13–60)

## 2016-10-12 LAB — AMYLASE: Amylase: 59 U/L (ref 28–100)

## 2016-10-12 MED ORDER — STERILE WATER FOR IRRIGATION IR SOLN *I*
900.0000 mL | Freq: Once | Status: DC
Start: 2016-10-12 — End: 2016-10-13

## 2016-10-12 MED ORDER — IOHEXOL 350 MG/ML (OMNIPAQUE) IV SOLN *I*
100.1000 mL | Freq: Once | INTRAVENOUS | Status: AC
Start: 2016-10-12 — End: 2016-10-12
  Administered 2016-10-12: 135 mL via INTRAVENOUS

## 2016-10-12 NOTE — Discharge Instructions (Addendum)
Discharge Instructions for Patients receiving   Contrast Medium    10/12/2016  1:36 PM    INFORMATION  I.V. contrast is eliminated through the kidneys.  Oral and rectal contrasts are not absorbed by the body and will be eliminated through the gastrointestinal tract.  Side effects and allergic reactions to contrast mediums are not common but can occur up to 48 hours after your scan.    INSTRUCTIONS   1. You will need to drink four 8 oz glasses of fluid over the next four hours, unless your medical condition does not allow you to do this.  Please speak with an [place] if you have any questions or concerns about this.  2. Mild headache may occur following contrast injection.  3. The kidneys excrete the contrast.  Your urine will NOT become discolored.  4. You may experience loose stools/diarrhea for approximately 24 hours after drinking oral contrast.  5. You may experience watery stool immediately after rectal contrast.  6.  If you are a DIABETIC and take Actos Plus Met, Actos Plus Met XR, Avandamet, Foramet, Glucophage, Glucophage XR, Glucovance, Glumetza, Janumet, Janumet XR, Jentadueto, [place], Kombiglyze XR, [place], [place], [place], [place], Xigduo XR or Metformin check with your doctor for management of your diabetes during this time.  Your doctor will have to address the need to hold this medication or the need for kidney function blood tests.   7.  There is no evidence that the contrast you have received would be harmful to your breastfed child.  If you choose, you may pump and discard your breast milk for the next 24 hours.    Delayed effects from contrast are not common.  However, notify your doctor IMMEDIATELY:    If nausea or vomiting occurs   If any itching, hives, wheezing, or rashes occur   Severe or throbbing headache    Monday - Friday 8am-5pm: For questions or concerns regarding your procedure please call [facility] ([facility]  [phone].    After hours/Weekends:  [facility] patients may call [phone] and ask to speak with the Radiology resident on call.   [facility] patients may call [phone] to speak with the [facility].  The Emergency Department is open 24 hours a day at [place] if emergency treatment is required.

## 2016-10-12 NOTE — Telephone Encounter (Signed)
Wo. Sleeve 08/27/2016    Patient called in to obtain results of imaging    Please f/u with patient [phone] (H)

## 2016-10-12 NOTE — Progress Notes (Signed)
Worsening abdominal pain the last two days since last seen, but a little better this morning.  No relief with PPI BID; did not try the carafate.  Pain primarily epigastric with radiation to back.  Constant in nature, but worse with eating.  Reports 4-5 loose BMs/day since last seen.  Lethargic.  No sick contacts.  Denies alcohol use.  Denies fevers, chills, shortness of breath, chest pain.    Vitals:    10/12/16 1139   BP: 122/75   Pulse: 97   Resp: 18   Temp: 35.8 C (96.4 F)   Weight: 90.9 kg (200 lb 6.4 oz)   Height: 1.702 m (5\' 7" )     NAD  NCAT  RRR  Non-labored breathing  Tender epigastric region.  No Murphy's sign.  No peritonitis.  Non-distended    A/p 41 year old female with worsening epigastric pain and leukocytosis.  Repeat labs today.  Obtain CT scan to r/o leak/intra-abdominal abscess.  Patient to return to clinic following CT to review results.    Addendum   Labs from today normal.  WBC back within normal limits, no evidence for pancreatitis.  CT essentially normal for upper abdominal pathology.  Patient to continue PPI BID and start carafate.  Will follow up at GP3 unless not improved prior to that visit

## 2016-10-24 ENCOUNTER — Other Ambulatory Visit: Payer: PRIVATE HEALTH INSURANCE

## 2016-10-31 ENCOUNTER — Encounter: Payer: Self-pay | Admitting: Surgery

## 2016-10-31 ENCOUNTER — Ambulatory Visit: Payer: PRIVATE HEALTH INSURANCE | Attending: Surgery | Admitting: Surgery

## 2016-10-31 VITALS — BP 113/70 | HR 71 | Temp 96.3°F | Resp 16 | Ht 67.0 in | Wt 198.6 lb

## 2016-10-31 DIAGNOSIS — K909 Intestinal malabsorption, unspecified: Secondary | ICD-10-CM

## 2016-10-31 DIAGNOSIS — Z6831 Body mass index (BMI) 31.0-31.9, adult: Secondary | ICD-10-CM | POA: Insufficient documentation

## 2016-10-31 LAB — COMPREHENSIVE METABOLIC PANEL
ALT: 27 U/L (ref 0–35)
AST: 16 U/L (ref 0–35)
Albumin: 4.1 g/dL (ref 3.5–5.2)
Alk Phos: 84 U/L (ref 35–105)
Anion Gap: 13 (ref 7–16)
Bilirubin,Total: 0.2 mg/dL (ref 0.0–1.2)
CO2: 21 mmol/L (ref 20–28)
Calcium: 9.5 mg/dL (ref 8.8–10.2)
Chloride: 106 mmol/L (ref 96–108)
Creatinine: 0.72 mg/dL (ref 0.51–0.95)
GFR,Black: 120 *
GFR,Caucasian: 104 *
Globulin: 2.5 g/dL — ABNORMAL LOW (ref 2.7–4.3)
Glucose: 85 mg/dL (ref 60–99)
Lab: 23 mg/dL — ABNORMAL HIGH (ref 6–20)
Potassium: 3.9 mmol/L (ref 3.3–5.1)
Sodium: 140 mmol/L (ref 133–145)
Total Protein: 6.6 g/dL (ref 6.3–7.7)

## 2016-10-31 LAB — CBC
Hematocrit: 39 % (ref 34–45)
Hemoglobin: 13.3 g/dL (ref 11.2–15.7)
MCH: 31 pg (ref 26–32)
MCHC: 34 g/dL (ref 32–36)
MCV: 91 fL (ref 79–95)
Platelets: 294 10*3/uL (ref 160–370)
RBC: 4.3 MIL/uL (ref 3.9–5.2)
RDW: 13 % (ref 11.7–14.4)
WBC: 9.3 10*3/uL (ref 4.0–10.0)

## 2016-10-31 LAB — VITAMIN B12: Vitamin B12: 769 pg/mL (ref 232–1245)

## 2016-10-31 LAB — IRON: Iron: 47 ug/dL (ref 34–165)

## 2016-10-31 LAB — FERRITIN: Ferritin: 61 ng/mL (ref 10–120)

## 2016-10-31 LAB — PTH, INTACT: Intact PTH: 30.5 pg/mL (ref 15.0–65.0)

## 2016-10-31 LAB — FOLATE: Folate: >20 ng/mL (ref >=4.6)

## 2016-10-31 NOTE — Progress Notes (Signed)
[facility] at [facility]  Postoperative Visit  GP3    Date of Visit: 10/31/2016   Patient: Shari Harvey   MR Number: [id number]   Date of Birth: 02/16/1976   Date of Surgery: 08/27/2016   Type of Surgery: Laparoscopic Sleeve Gastrectomy         Surgeon: Sol PasserWilliam E. Gershon Mussel'Malley, M.D., F.A.C.S.       Bariatric Vitals:  Vitals 10/31/2016 10/12/2016 10/12/2016 10/10/2016 10/03/2016   Preop Weight 235 lb - 235 lb 235 lb 235 lb   Weight 198 lb 9.6 oz 200 lb 200 lb 6.4 oz 203 lb 3.2 oz 206 lb 9.6 oz   BAR Weight (lbs) 198.6 - - - -   Height 5\' 7"  - 5\' 7"  5\' 7"  5\' 7"    BAR Height (in) 67 - - - -   BMI (Calculated) 31.2 kg/m2 - 31.5 kg/m2 31.9 kg/m2 32.4 kg/m2   BAR BMI (Calculated) 31.2 kg/m2 - - - -   IBW in kg (Bariatric) 61.24 - 61.24 61.24 61.24   IBW in lbs (Bariatric) 135 - 135 135 135   Percent Excess Weight Loss -36.42 % - -34.61 % -31.81 % -28.41 %   Weight Change for Visit (kg) -0.63 -0.18 -1.27 -1.54 -5.35   Weight Change Total (kg) -16.51 -15.87 -15.69 -14.42 -12.88   Initial Excess Weight 45.33 - 45.33 45.33 45.33   Total Weight Loss Percent -15.49 % -14.89 % -14.72 % -13.53 % -12.09 %     Vitals 10/31/2016 10/12/2016 10/10/2016 10/03/2016 09/05/2016   BP 113/70 122/75 122/83 126/76 122/72   Pulse 71 97 95 76 80   Resp 16 18 18 16 18    Temp 96.3 96.4 97.5 95.7 96.8       ACS Information:  ACS 10/31/2016 10/31/2016 10/12/2016 10/12/2016 10/12/2016   Weight Change Since Preop -16.51 - -15.87 -15.69 -   DIAGNOSES THIS VISIT - BMI 31.0-31.9,adult  Intestinal malabsorption, unspecified type Abdominal pain, unspecified abdominal location - BMI 31.0-31.9,adult  Abdominal pain, unspecified abdominal location   TREATMENTS - - - - -       Subjective:  Patient returns for a standard post operative evaluation. A review of pertinent symptoms reveals:  Shari Harvey complains of constipation  Pertinent negatives include abdominal pain, nausea, heartburn or reflux, shortness of breath and vomiting      Co-Morbid  Conditions:  These comorbid conditions existed prior to surgery: obstructive sleep apnea, osteoarthritis, GERD, asthma, anxiety  .    Patient does have sleep apnea and is compliant with using CPAP  Patient does not have diabetes.   Patient does not have hypertension treated with medication.  Patient does have GERD treated with PPI or H2  Patient does not have hyperlipidemia treated with medication    Complications:  Patient was not diagnosed with venous thrombosis postoperatively.  Patient was not diagnosed with a pulmonary emobolism postoperatively.  Patient does not have a new incisional hernia    Current Medications, Vitamins, and Supplements list:  Medications/Vitamins/Supplements         Last Dose Start Date End Date Provider     acyclovir (ZOVIRAX) 400 MG tablet   05/31/15  --  [provider]     Take 400 mg by mouth 3 times daily as needed     albuterol HFA 108 (90 BASE) MCG/ACT inhaler   --  --  [provider]     Inhale 2 puffs into the lungs every 6 hours as needed  for Wheezing   Shake well before each use.     beclomethasone (QVAR) 80 MCG/ACT inhaler   07/05/15  --  Shults, Antonietta Jewel, NP     Inhale 1 puff into the lungs 2 times daily   Shake well before each use.     calcium citrate-vitamin D (CALCIUM CITRATE + D) 315-250 MG-UNIT per tablet   --  --  [provider]     Take 2 tablets by mouth 2 times daily     clonazePAM (KLONOPIN) 0.5 MG tablet   06/13/15  --  [provider]     Take 0.5 mg by mouth 2 times daily as needed   for anxiety     desloratadine (CLARINEX) 5 MG tablet   --  --  [provider]     Take 5 mg by mouth as needed        diphenhydrAMINE (BENADRYL) 50 MG tablet   --  --  [provider]     Take 50 mg by mouth nightly as needed for Itching     generic DME   11/25/12  --  Hubert Azure, PA     Lateral unloader brace.     Mag Aspart-Potassium Aspart 250-250 MG CAPS   --  --  [provider]     Take by mouth as  needed        MELATONIN PO   --  --  [provider]     Take 3 mg by mouth as needed        montelukast (SINGULAIR) 10 MG tablet   03/04/15  --  [provider]     Take 10 mg by mouth daily     omeprazole (PRILOSEC) 20 MG capsule   10/10/16  11/09/16  Elfredia Nevins, MD     Take 1 capsule (20 mg total) by mouth daily (before breakfast)     ondansetron (ZOFRAN) 4 MG tablet   08/29/16  --  Elfredia Nevins, MD     Take 1 tablet (4 mg total) by mouth 3 times daily as needed for Vomiting     oxyCODONE-acetaminophen (PERCOCET) 5-325 MG per tablet   --  --  [provider]     Take 1 tablet by mouth every 4-6 hours as needed for Pain     promethazine (PHENERGAN) 25 MG tablet   08/29/16  --  Elfredia Nevins, MD     Take 1 tablet (25 mg total) by mouth every 4-6 hours as needed for Vomiting     RECTIV 0.4 % ointment   10/18/15  --  [provider]     as needed        sertraline (ZOLOFT) 50 MG tablet   --  --  [provider]     Take 100 mg by mouth daily        sucralfate (CARAFATE) 1 GM/10ML suspension (Expired)   10/10/16  10/24/16  Elfredia Nevins, MD     Take 10 mLs (1 g total) by mouth 4 times daily (before meals and nightly)     SUMAtriptan (IMITREX) 100 MG tablet   --  --  [provider]     Take 100 mg by mouth as needed for Migraine   Take at onset of headache. May repeat once in 2 hours.     topiramate (TOPAMAX) 200 MG tablet   09/28/15  --  [provider]     Take 200  mg by mouth nightly        ursodiol (ACTIGALL) 300 MG capsule Taking  09/20/16  10/31/16  Rolland Porter, PA     Take 1 capsule (300 mg total) by mouth 2 times daily          Social History:  Shari Harvey  reports that she quit smoking about 13 years ago. She has never used smokeless tobacco.   She  reports that she does not drink alcohol.  Shari Harvey reports she does not use NSAIDS.  She does not attend support meetings.  Shari Harvey exercises 0 times a week.    Diet History:  Shari R  Harvey does adhere to the current meal plan.  Shari Harvey is using protein supplements.  She does not use caffeine.    Physical Exam:  Physical Exam   Constitutional: She is oriented to person, place, and time. She appears well-developed and well-nourished. No distress.   HENT:   Head: Normocephalic and atraumatic.   Eyes: Pupils are equal, round, and reactive to light. Right eye exhibits no discharge. Left eye exhibits no discharge. No scleral icterus.   Neck: Normal range of motion. No JVD present. No tracheal deviation present.   Cardiovascular: Normal rate, regular rhythm and normal heart sounds.  Exam reveals no gallop and no friction rub.    No murmur heard.  Pulmonary/Chest: Effort normal and breath sounds normal. No stridor. No respiratory distress.   Abdominal: Soft. Bowel sounds are normal. She exhibits no distension. There is no tenderness. There is no rebound.   Musculoskeletal: Normal range of motion. She exhibits no edema.   Neurological: She is alert and oriented to person, place, and time.   Skin: Skin is warm and dry. She is not diaphoretic.        Assessment:  Patient is without complaints.  Post op course has been uneventful.  Weight loss has been average.  She is compliant with diet and protein supplements.  Pathology: no  Ct Abdomen And Pelvis With Contrast    Result Date: 10/12/2016  1. Sequelae of gastric sleeve surgery. No free air. No fluid collection. 2. 2.1 cm right adnexal cyst with associated free fluid presumably an involuting functional cyst but consider correlating with serum pregnancy test to exclude the possibility of ectopic. 3. The remainder of the examination is essentially unremarkable. END OF IMPRESSION [facility] submits this DICOM format image data and final report to the [facility], an independent secure electronic health information exchange, on a reciprocally searchable basis (with patient authorization) for a minimum of 12 months after exam date.     US Abdominal  Complete    Result Date: 10/11/2016  1.  Sludge and tiny stones in the gallbladder. Mild tenderness over the gallbladder, however, tenderness was also noted in the epigastric region. No gallbladder wall thickening or pericholecystic fluid. 2.  Hepatomegaly. END OF REPORT [facility] submits this DICOM format image data and final report to the [facility], an independent secure electronic health information exchange, on a reciprocally searchable basis (with patient authorization) for a minimum of 12 months after exam date.     In addition: Not Applicable       Plan:  Shari Harvey was educated on Diet: lifestyle  Exercise: Increase exercise as tolerated.     Orders Placed This Encounter   Procedures    Folate    Iron    Vitamin B12    Vitamin d 25 hydroxy, d2&d3  Ferritin    CBC    Comprehensive metabolic panel    PTH, intact

## 2016-11-02 ENCOUNTER — Other Ambulatory Visit: Payer: PRIVATE HEALTH INSURANCE

## 2016-11-05 LAB — VITAMIN D
25-OH VIT D2: <4 ng/mL
25-OH VIT D3: 44 ng/mL
25-OH Vit Total: 44 ng/mL (ref 30–60)

## 2017-01-21 ENCOUNTER — Encounter: Payer: Self-pay | Admitting: Surgery

## 2017-01-21 ENCOUNTER — Ambulatory Visit: Payer: PRIVATE HEALTH INSURANCE | Attending: Surgery | Admitting: Surgery

## 2017-01-21 VITALS — BP 119/69 | HR 67 | Temp 97.0°F | Resp 16 | Ht 67.0 in | Wt 183.6 lb

## 2017-01-21 DIAGNOSIS — Z9884 Bariatric surgery status: Secondary | ICD-10-CM | POA: Insufficient documentation

## 2017-01-21 DIAGNOSIS — Z6828 Body mass index (BMI) 28.0-28.9, adult: Secondary | ICD-10-CM | POA: Insufficient documentation

## 2017-01-21 DIAGNOSIS — K909 Intestinal malabsorption, unspecified: Secondary | ICD-10-CM

## 2017-01-21 LAB — COMPREHENSIVE METABOLIC PANEL
ALT: 10 U/L (ref 0–35)
AST: 14 U/L (ref 0–35)
Albumin: 4.2 g/dL (ref 3.5–5.2)
Alk Phos: 93 U/L (ref 35–105)
Anion Gap: 10 (ref 7–16)
Bilirubin,Total: <0.2 mg/dL (ref 0.0–1.2)
CO2: 22 mmol/L (ref 20–28)
Calcium: 9.2 mg/dL (ref 8.8–10.2)
Chloride: 106 mmol/L (ref 96–108)
Creatinine: 0.74 mg/dL (ref 0.51–0.95)
GFR,Black: 116 *
GFR,Caucasian: 101 *
Globulin: 2.3 g/dL — ABNORMAL LOW (ref 2.7–4.3)
Glucose: 78 mg/dL (ref 60–99)
Lab: 17 mg/dL (ref 6–20)
Potassium: 4.2 mmol/L (ref 3.3–5.1)
Sodium: 138 mmol/L (ref 133–145)
Total Protein: 6.5 g/dL (ref 6.3–7.7)

## 2017-01-21 LAB — CBC
Hematocrit: 41 % (ref 34–45)
Hemoglobin: 13.9 g/dL (ref 11.2–15.7)
MCH: 31 pg (ref 26–32)
MCHC: 34 g/dL (ref 32–36)
MCV: 91 fL (ref 79–95)
Platelets: 337 10*3/uL (ref 160–370)
RBC: 4.5 MIL/uL (ref 3.9–5.2)
RDW: 12.1 % (ref 11.7–14.4)
WBC: 8.8 10*3/uL (ref 4.0–10.0)

## 2017-01-21 LAB — FERRITIN: Ferritin: 30 ng/mL (ref 10–120)

## 2017-01-21 LAB — FOLATE: Folate: 16.2 ng/mL (ref >=4.6)

## 2017-01-21 LAB — VITAMIN B12: Vitamin B12: 995 pg/mL (ref 232–1245)

## 2017-01-21 LAB — IRON: Iron: 52 ug/dL (ref 34–165)

## 2017-01-21 NOTE — Patient Instructions (Signed)
[  x]  Bariatric lifestyle meal plan  []  Increase protein supplements  [x]  Continue with current protein regimen  [x]  Continue with vitamin regimen   []  Ferocon   [x]  Multivitamin   [x]  Calcium with Vitamin D   []  Vitamin D              []   Iron              [x]  Vitamin B 12              [x]  Thiamine    [x]  Begin exercise as tolerated  []  Increase exercise as tolerated  []  Continue exercise

## 2017-01-21 NOTE — Progress Notes (Signed)
Bariatric Surgery Center at [facility]  Postoperative Visit  GP6    Date of Visit: 01/21/2017   Patient: Shari Harvey   MR Number: [id number]   Date of Birth: July 19, 1975   Date of Surgery: 08/27/2016   Type of Surgery: Laparoscopic Sleeve Gastrectomy         Surgeon: Sol Passer. Gershon Mussel, M.D., F.A.C.S.       Bariatric Vitals:  Vitals 01/21/2017 10/31/2016 10/12/2016 10/12/2016 10/10/2016   Preop Weight 235 lb 235 lb - 235 lb 235 lb   Weight 183 lb 9.6 oz 198 lb 9.6 oz 200 lb 200 lb 6.4 oz 203 lb 3.2 oz   BAR Weight (lbs) 183.6 198.6 - - -   Height 5\' 7"  5\' 7"  - 5\' 7"  5\' 7"    BAR Height (in) 67 67 - - -   BMI (Calculated) 28.8 kg/m2 31.2 kg/m2 - 31.5 kg/m2 31.9 kg/m2   BAR BMI (Calculated) 28.8 kg/m2 31.2 kg/m2 - - -   IBW in kg (Bariatric) 61.24 61.24 - 61.24 61.24   IBW in lbs (Bariatric) 135 135 - 135 135   Percent Excess Weight Loss -51.42 % -36.42 % - -34.61 % -31.81 %   Weight Change for Visit (kg) -6.8 -0.63 -0.18 -1.27 -1.54   Weight Change Total (kg) -23.31 -16.51 -15.87 -15.69 -14.42   Initial Excess Weight 45.33 45.33 - 45.33 45.33   Total Weight Loss Percent -21.87 % -15.49 % -14.89 % -14.72 % -13.53 %     Vitals 01/21/2017 10/31/2016 10/12/2016 10/10/2016 10/03/2016   BP 119/69 113/70 122/75 122/83 126/76   Pulse 67 71 97 95 76   Resp 16 16 18 18 16    Temp 97 96.3 96.4 97.5 95.7       ACS Information:  ACS 01/21/2017 01/21/2017 10/31/2016 10/31/2016 10/12/2016   Weight Change Since Preop -23.31 - -16.51 - -15.87   DIAGNOSES THIS VISIT - BMI 28.0-28.9,adult  S/P laparoscopic sleeve gastrectomy  Intestinal malabsorption, unspecified type - BMI 31.0-31.9,adult  Intestinal malabsorption, unspecified type Abdominal pain, unspecified abdominal location   TREATMENTS - - - - -       Subjective:  Patient returns for a standard post operative evaluation. A review of pertinent symptoms reveals:  Shari Harvey complains of  Pertinent negatives include abdominal pain, nausea, heartburn or reflux, fatigue, vomiting, constipation,  difficulty swallowing and diarrhea       Patient is not getting enough Vit B 12 in her current regime    Co-Morbid Conditions:  These comorbid conditions existed prior to surgery: obstructive sleep apnea, osteoarthritis, GERD, asthma, anxiety  .    Patient does have sleep apnea and is compliant with using CPAP  Patient does not have diabetes.   Patient does not have hypertension treated with medication.  Patient does not have GERD treated with PPI or H2  Patient does not have hyperlipidemia treated with medication    Complications:  Patient was not diagnosed with venous thrombosis postoperatively.  Patient was not diagnosed with a pulmonary emobolism postoperatively.  Patient does not have a new incisional hernia    Current Medications, Vitamins, and Supplements list:  Medications/Vitamins/Supplements         Last Dose Start Date End Date Provider     acyclovir (ZOVIRAX) 400 MG tablet As Needed  05/31/15  --  [provider]     Take 400 mg by mouth 3 times daily as needed     albuterol HFA 108 (90 BASE) MCG/ACT inhaler  As Needed  --  --  [provider]     Inhale 2 puffs into the lungs every 6 hours as needed for Wheezing   Shake well before each use.     beclomethasone (QVAR) 80 MCG/ACT inhaler Not Taking  07/05/15  --  Tyna Jaksch, NP     Inhale 1 puff into the lungs 2 times daily   Shake well before each use.     calcium citrate-vitamin D (CALCIUM CITRATE + D) 315-250 MG-UNIT per tablet Taking  --  --  [provider]     Take 2 tablets by mouth 2 times daily     clonazePAM (KLONOPIN) 0.5 MG tablet As Needed  06/13/15  --  [provider]     Take 0.5 mg by mouth 2 times daily as needed   for anxiety     Cyanocobalamin (VITAMIN B 12 PO) Not Taking  --  --  [provider]     Take by mouth     desloratadine (CLARINEX) 5 MG tablet As Needed  --  --  [provider]     Take 5 mg by mouth as needed        diphenhydrAMINE (BENADRYL) 50 MG tablet As Needed   --  --  [provider]     Take 50 mg by mouth nightly as needed for Itching     generic DME Not Taking  11/25/12  --  Hubert Azure, PA     Lateral unloader brace.     Mag Aspart-Potassium Aspart 250-250 MG CAPS Not Taking  --  --  [provider]     Take by mouth as needed        MELATONIN PO As Needed  --  --  [provider]     Take 3 mg by mouth as needed        montelukast (SINGULAIR) 10 MG tablet Taking  03/04/15  --  [provider]     Take 10 mg by mouth daily     Multiple Vitamins-Minerals (MULTI COMPLETE PO) Taking  --  --  [provider]     Take by mouth     omeprazole (PRILOSEC) 20 MG capsule (Expired)   10/10/16  11/09/16  Elfredia Nevins, MD     Take 1 capsule (20 mg total) by mouth daily (before breakfast)     ondansetron (ZOFRAN) 4 MG tablet Not Taking  08/29/16  --  Elfredia Nevins, MD     Take 1 tablet (4 mg total) by mouth 3 times daily as needed for Vomiting     oxyCODONE-acetaminophen (PERCOCET) 5-325 MG per tablet Not Taking  --  --  [provider]     Take 1 tablet by mouth every 4-6 hours as needed for Pain     promethazine (PHENERGAN) 25 MG tablet As Needed  08/29/16  --  Elfredia Nevins, MD     Take 1 tablet (25 mg total) by mouth every 4-6 hours as needed for Vomiting     RECTIV 0.4 % ointment Not Taking  10/18/15  --  [provider]     as needed        sertraline (ZOLOFT) 50 MG tablet Taking  --  --  [provider]     Take 100 mg by mouth daily        sucralfate (CARAFATE) 1 GM/10ML suspension (Expired)   10/10/16  10/24/16  Elfredia Nevins,  MD     Take 10 mLs (1 g total) by mouth 4 times daily (before meals and nightly)     SUMAtriptan (IMITREX) 100 MG tablet As Needed  --  --  [provider]     Take 100 mg by mouth as needed for Migraine   Take at onset of headache. May repeat once in 2 hours.     topiramate (TOPAMAX) 200 MG tablet Taking  09/28/15  --  [provider]     Take 200 mg  by mouth nightly        ursodiol (ACTIGALL) 300 MG capsule (Expired)   09/20/16  10/31/16  Rolland Porter, PA     Take 1 capsule (300 mg total) by mouth 2 times daily          Social History:  Shari Harvey  reports that she quit smoking about 13 years ago. She has never used smokeless tobacco.   She  reports that she does not drink alcohol.  Shari Harvey reports she does not use NSAIDS.  She does not attend support meetings.  Shari Harvey does not exercise.    Diet History:  Shari Harvey does adhere to the current meal plan.  Shari Harvey is using protein supplements.  She does not use caffeine.    Physical Exam:  Physical Exam   Constitutional: She is oriented to person, place, and time. She appears well-developed and well-nourished.   HENT:   Head: Normocephalic and atraumatic.   Eyes: EOM are normal.   Neck: Normal range of motion. Neck supple.   Cardiovascular: Normal rate, regular rhythm and normal heart sounds.    No murmur heard.  Pulmonary/Chest: Effort normal and breath sounds normal. No respiratory distress. She has no wheezes. She has no rales.   Abdominal: Soft. Bowel sounds are normal. She exhibits no distension and no mass. There is no tenderness. There is no rebound.   Musculoskeletal: Normal range of motion. She exhibits no edema.   Neurological: She is alert and oriented to person, place, and time.   Skin: Skin is warm and dry. No rash noted. No erythema.   Psychiatric: She has a normal mood and affect. Her behavior is normal. Thought content normal.        Assessment:  Patient is without complaints.  Post op course has been uneventful.  Weight loss has been excellent.  She is compliant with diet.  Pathology: no  No results found.  In addition: Not Applicable       Plan:  Start thiamine and Vit B 12  Follow up with sleep medicine  Shari Harvey was educated on Diet: lifestyle  Protein: current regimen  Exercise: Begin exercise.     Orders Placed This Encounter   Procedures    Folate    Iron    Vitamin  B12    Vitamin d 25 hydroxy, d2&d3    Ferritin    CBC    Comprehensive metabolic panel       Return in about 6 months (around 07/24/2017).

## 2017-01-21 NOTE — Addendum Note (Signed)
Addended by: Windy Canny on: 01/21/2017 03:07 PM     Modules accepted: Orders

## 2017-01-23 LAB — VITAMIN D
25-OH VIT D2: <4 ng/mL
25-OH VIT D3: 42 ng/mL
25-OH Vit Total: 42 ng/mL (ref 30–60)

## 2017-02-13 ENCOUNTER — Encounter: Payer: Self-pay | Admitting: Registered"

## 2017-04-18 ENCOUNTER — Encounter: Payer: Self-pay | Admitting: Plastic Surgery

## 2017-04-18 ENCOUNTER — Ambulatory Visit: Payer: PRIVATE HEALTH INSURANCE | Attending: General Practice | Admitting: Plastic Surgery

## 2017-04-18 VITALS — BP 120/62 | HR 81 | Temp 97.8°F | Ht 67.0 in | Wt 175.0 lb

## 2017-04-18 DIAGNOSIS — E881 Lipodystrophy, not elsewhere classified: Secondary | ICD-10-CM

## 2017-04-18 NOTE — H&P (Addendum)
Plastic Surgery  [facility]  History and Physical      Name: Shari Harvey, Shari Harvey  MRN: [id number]  DOB: 05/30/1976      Date of Encounter: 04/18/2017       Medical Providers    Referring: [facility]   PCP: Demetra ShinerKopp, David, MD       Chief Complaint   Skin laxity after massive weight loss.      History of Present Illness   Today I had the pleasure of [facility] Shari [facility] at [facility]. She is a 41 y.o. female who presents following massive weight loss via Gastric Sleeve. Her bariatric surgery was performed in March of 2018 by Dr. Gershon Mussel'Malley. Shari Harvey tells me her maximum weight was 240 lbs with a corresponding maximum BMI of 37. Today her weight is 79.4 kg (175 lb) with a corresponding BMI of 27. Her lowest recorded weight was 175. Obesity onset was at teen years of age. Her weight has been stable over the last 3 months.       Shari Harvey presents today with a concern of drooping of breast and skin laxity of the abdomen.     She is currently a DD. She reports neck/back/shoulder pain daily which is a 3/10. She is using Tramadol, NSAIDs and Botox with little improvement in the pain. She denies family history of breast cancer. She has had a mammogram within the year.     Review of Systems   A comprehensive list of systems was reviewed with the patient.    Constitutional: negative.    Eyes: positive for contacts/glasses.    Ears, nose, mouth, throat, and face: negative.    Respiratory: negative.    Cardiovascular: negative.    Gastrointestinal: negative.    Genitourinary:negative.    Breast/Integument: negative. Also as per HPI.    Hematologic/lymphatic: negative.    Musculoskeletal:negative.    Neurological: positive for headaches.    Behavioral/Psych: positive for anxiety.    Endocrine: negative.    Allergic/Immunologic: negative.      Past Medical History   She  has a past medical history of Anxiety; Asthma; Complication of anesthesia; Fatigue;  GERD (gastroesophageal reflux disease); Herniated lumbar disc without myelopathy; History of rectal abscess; Hyperlipidemia; Hypermobility syndrome; Hypoglycemia; Insomnia; Irritable bowel syndrome; Migraines; Morbid obesity; Numbness and tingling; OSA on CPAP; Osteoarthritis; Rash; Shortness of breath; Stress incontinence; and Supraventricular arrhythmia.     Past Surgical History   She has a past surgical history that includes Tonsillectomy and adenoidectomy; Cesarean Section, Unspecified; lumbar laminectomy; PERIANAL; ANNAL FISSURE; Spine surgery; Cesarean section, classic ([id number],[id number]); and pr lap, gast restrict proc, longitudinal gastrectomy (N/A, 08/27/2016).       Allergies   She is allergic to morphine; adhesive tape; avelox [moxifloxacin]; environmental allergies; flonase [fluticasone]; and shellfish-derived products.       Medications   She has a current medication list which includes the following prescription(s): multiple vitamins-minerals, calcium citrate-vitamin d, topiramate, sumatriptan, sertraline, cyanocobalamin, oxycodone-acetaminophen, sucralfate, omeprazole, ursodiol, promethazine, ondansetron, mag aspart-potassium aspart, melatonin, diphenhydramine, rectiv, acyclovir, clonazepam, beclomethasone, montelukast, albuterol hfa, desloratadine, and generic dme.        Social History   She reports that she quit smoking about 13 years ago. She has never used smokeless tobacco. She reports that she currently engages in sexual activity and has had female partners. She reports that she does not drink alcohol or use drugs.   Occupation: Physical therapist/manager.  Family History   Her family history includes Asthma in her father, sister, and sister; Cancer in her father, mother, and sister; Depression in her mother; Early death in her mother; High cholesterol in her father; Migraines in her sister and sister; Morbid Obesity in her mother; Obesity in her father; Osteoarthritis in her father, sister, and  sister; Stroke in her mother; Thyroid disease in her sister and sister.       Physical Exam   Vital signs: BP 120/62 (BP Location: Right arm, Patient Position: Sitting, Cuff Size: large adult)    Pulse 81    Temp 36.6 C (97.8 F) (Temporal)    Ht 1.702 m (5\' 7" )    Wt 79.4 kg (175 lb)    SpO2 99%    BMI 27.41 kg/m     General appearance: alert, well appearing, and in no distress and well hydrated.   Neurologic: alert, oriented to person, place, and time, normal mood, behavior, speech, dress, motor activity, and thought processes, affect appropriate to mood.   Head: Normocephalic, without obvious abnormality.   Neck: no adenopathy, no asymmetry, masses, or scars, no jugular venous distention.   Arms: within normal limits.   Chest: symmetric, no deformities, no chest wall tenderness.   Breasts: ptosis grade 1.   Lungs: unlabored respirations, no intercostal retractions or accessory muscle use.   Heart: regular rate and rhythm.   Abdomen: Soft, NT/ND. No hernia or bulge. Well-healed surgical scars. No intertrigo. Abdominal pannus does not obscure the genital region   Skin: multiple folds on body.    Assessment and Plan   Shari Harvey presents today status post massive weight loss with a chief complaint of residual skin laxity and associated intertrigo. She has done an excellent job with her weight loss.    The patient is an excellent candidate for Mastopexy and Abdominoplasty. Today we discussed this procedure in detail. She was given quotes today. Should Shari Harvey decide to proceed with surgical intervention, we will see her in the [facility] for a formal preoperative visit to discuss the procedure in further detail. Pending issues: She should wait 1 full year from her gastric sleeve procedure (March 2019) prior to proceeding with surgery.  Patient was seen and evaluated today with Dr. Larry SierrasBossert.

## 2017-05-16 ENCOUNTER — Telehealth: Payer: Self-pay | Admitting: Surgery

## 2017-05-16 NOTE — Telephone Encounter (Signed)
WO 08/2016 S/P SLEEVE     PATIENT WANTS TO KNOW IF SHE CAN TAKE STEROIDS

## 2017-05-16 NOTE — Telephone Encounter (Signed)
Left message

## 2017-05-16 NOTE — Telephone Encounter (Signed)
Pt educated on risk and will discuss treatment with her Neurologist.

## 2017-08-04 ENCOUNTER — Ambulatory Visit
Admission: AD | Admit: 2017-08-04 | Discharge: 2017-08-04 | Disposition: A | Discharge status: Home / Self Care | Payer: PRIVATE HEALTH INSURANCE | Source: Ambulatory Visit | Attending: Emergency Medicine | Admitting: Emergency Medicine

## 2017-08-04 DIAGNOSIS — G43909 Migraine, unspecified, not intractable, without status migrainosus: Secondary | ICD-10-CM | POA: Insufficient documentation

## 2017-08-04 LAB — HM HIV SCREENING OFFERED

## 2017-08-04 MED ORDER — KETOROLAC TROMETHAMINE 30 MG/ML IJ SOLN *I*
60.0000 mg | Freq: Once | INTRAMUSCULAR | Status: AC
Start: 2017-08-04 — End: 2017-08-04
  Administered 2017-08-04: 60 mg via INTRAMUSCULAR

## 2017-08-04 NOTE — Discharge Instructions (Signed)
We have given you a shot of toradol, You should go home and rest.IF the headache gets worse  then you should follow up in the  ER.

## 2017-08-04 NOTE — UC Provider Note (Signed)
Mom    History     Chief Complaint   Patient presents with    Sinusitis     patient with PMH of migraine x 30 years currently being treated w/ augmentin for sinus infection that she started to take wednesday night. She reports some nausea but no vomiting. Yesterday she took immitrex with no releif then tramadol with positive effect. She denies taking any meds today.     Patient is here today for migraine headache She has long standing history of migraine headahes as well as chronic sinusitis. She says when she get a sinus infection it tends to aggravate her migraines. She is currently on Augmentin for a sinus infection. She says started with  A bad migraine, took her imitrex and then a dose of tramadol but that did not help. She says usually when she gets one like this needs a shot of toradol to relieve the headache. She has had some mild nausea but no vomiting The headache is like her typical migraine            Medical/Surgical/Family History     Past Medical History:   Diagnosis Date    Anxiety     Asthma     managed by Dr. Nicholes Stairs    Complication of anesthesia     hypotension    Fatigue     GERD (gastroesophageal reflux disease)     Herniated lumbar disc without myelopathy     History of rectal abscess     Hyperlipidemia     not being treated    Hypermobility syndrome     Hypoglycemia     Insomnia     Irritable bowel syndrome     Migraines     Morbid obesity     Numbness and tingling     OSA on CPAP     Osteoarthritis     Rash     under hanging skin    Shortness of breath     Stress incontinence     Supraventricular arrhythmia         Patient Active Problem List   Diagnosis Code    BMI 36.0-36.9,adult Z68.36    Morbid obesity E66.01    Asthma J45.909    GERD (gastroesophageal reflux disease) K21.9    Osteoarthritis M19.90    Sleep apnea G47.30    Hyperlipidemia E78.5    BMI 35.0-35.9,adult Z68.35    S/P sleeve, 08/27/16, BMI 36, OSA, GERD, HLD Z98.84    BMI 31.0-31.9,adult Z68.31     BMI 31.0-31.9,adult Z68.31    BMI 31.0-31.9,adult Z68.31    BMI 28.0-28.9,adult Z68.28    Malabsorption K90.9            Past Surgical History:   Procedure Laterality Date    ANNAL FISSURE      & Fistula septic    CESAREAN SECTION, CLASSIC  1610,[id number]    CESAREAN SECTION, UNSPECIFIED      LUMBAR LAMINECTOMY      PERIANAL      REMOVAL OF VENOUS THROMBOSIS    PR LAP, GAST RESTRICT PROC, LONGITUDINAL GASTRECTOMY N/A 08/27/2016    Procedure: LAPAROSCOPIC GASTRECTOMY SLEEVE;  Surgeon: Darrell Jewel, MD;  Location: [facility];  Service: General    SPINE SURGERY      TONSILLECTOMY AND ADENOIDECTOMY       Family History   Problem Relation Age of Onset    Cancer Mother         glioblastoma  Depression Mother     Early death Mother     Morbid Obesity Mother     Stroke Mother     Osteoarthritis Father     Asthma Father     Cancer Father     High cholesterol Father     Obesity Father     Asthma Sister     Cancer Sister     Osteoarthritis Sister     Osteoarthritis Sister     Migraines Sister     Migraines Sister     Thyroid disease Sister     Thyroid disease Sister     Asthma Sister           Social History   Substance Use Topics    Smoking status: Former Smoker     Quit date: 06/05/2003    Smokeless tobacco: Never Used    Alcohol use No     Living Situation     Questions Responses    Patient lives with     Homeless     Caregiver for other family member     External Services     Employment     Domestic Violence Risk                 Review of Systems   Review of Systems   Constitutional: Negative for chills and fever.   HENT: Negative for congestion.    Respiratory: Negative for choking.    Cardiovascular: Negative for chest pain.   Gastrointestinal: Positive for nausea.   Skin: Negative for rash.   Neurological: Positive for headaches.       Physical Exam   Triage Vitals  Triage Start: Start, (08/04/17 [id number])   First Recorded BP: 120/80, Resp: 16, Temp: 36.4 C (97.5 F), Temp src: TEMPORAL  Oxygen Therapy SpO2: 98 %, Oximetry Source: Rt Hand, O2 Device: None (Room air), Heart Rate: 81, (08/04/17 0936)  .  First Pain Reported  0-10 Scale: 7, Pain Location/Orientation: Head, (08/04/17 [id number])       Physical Exam   Constitutional: She is oriented to person, place, and time. She appears well-developed and well-nourished. No distress.   HENT:   Head: Normocephalic.   Right Ear: Tympanic membrane, external ear and ear canal normal.   Left Ear: Tympanic membrane, external ear and ear canal normal.   Mouth/Throat: Uvula is midline and oropharynx is clear and moist.   Eyes: Pupils are equal, round, and reactive to light. Conjunctivae and EOM are normal.   Neck: Normal range of motion. Neck supple.   Cardiovascular: Normal rate, regular rhythm and normal heart sounds.    Pulmonary/Chest: Effort normal and breath sounds normal.   Neurological: She is alert and oriented to person, place, and time. No cranial nerve deficit.   Skin: Skin is warm and dry.   Psychiatric: She has a normal mood and affect.   Nursing note and vitals reviewed.       Medical Decision Making        Initial Evaluation:  ED First Provider Contact     Date/Time Event User Comments    08/04/17 [phone] ED First Provider Contact Tecia Cinnamon Initial Face to Face Provider Contact          Patient was seen on: 08/04/2017        Assessment:  41 y.o.female comes [facility] with migraine headache for the past few days which has not responded to her home medications    Differential Diagnosis  includes: Migraine                                                      Tension headache                                                     Sinus headache                                                     Mixed headache  Orders Placed This Encounter    HM HIV SCREENING OFFERED    ketorolac (TORADOL) 30 mg/mL injection 60 mg       Recent Results (from the past 24 hour(s))   HM HIV SCREENING OFFERED    Collection Time: 08/04/17  9:41 AM   Result Value  Ref Range    HM HIV SCREENING OFFERED Declined            Final Diagnosis    ICD-10-CM ICD-9-CM   1. Migraine without status migrainosus, not intractable, unspecified migraine type G43.909 346.90       Encourage fluids, encourage rest, good hand hygiene.    Use over the counter medications as discussed.    Please start the new medications as below:    Current Discharge Medication List          Please follow up with your physician as below:        Thank you Soumya Meritt for coming to [facility] for your health care concerns.    If your condition changes and/or worsens please follow up with her primary doctor and/or return to the urgent care center.    If short of breath, chest pains or any other concerns please report to the emergency room.    In the event of an Emergency dial 911.    Plan: Patient receive toradol in the office, she will go home and rest, she should complete the augmentin for her sinus infection and advised to go to ER  If the headache gets worse         Final Diagnosis  Final diagnoses:   None         Delorise Jackson, MD              Delorise Jackson, MD  08/04/17 1414

## 2017-08-04 NOTE — ED Triage Notes (Signed)
patient with PMH of migraine x 30 years currently being treated w/ augmentin for sinus infection that she started to take wednesday night. She reports some nausea but no vomiting. Yesterday she took immitrex with no releif then tramadol with positive effect. She denies taking any meds today.    Triage Note   Corey SkainsYusuf Ariellah Faust, RN

## 2017-08-12 ENCOUNTER — Telehealth: Payer: Self-pay

## 2017-08-12 NOTE — Telephone Encounter (Signed)
Referral in review

## 2017-08-14 ENCOUNTER — Other Ambulatory Visit
Admission: RE | Admit: 2017-08-14 | Discharge: 2017-08-14 | Disposition: A | Discharge status: Home / Self Care | Payer: PRIVATE HEALTH INSURANCE | Source: Ambulatory Visit | Attending: Obstetrics and Gynecology | Admitting: Obstetrics and Gynecology

## 2017-08-14 DIAGNOSIS — N760 Acute vaginitis: Secondary | ICD-10-CM | POA: Insufficient documentation

## 2017-08-15 LAB — VAGINITIS SCREEN: DNA PROBE
Vaginitis Screen:DNA Probe: POSITIVE — AB
Vaginitis Screen:DNA Probe: POSITIVE — AB

## 2017-08-23 ENCOUNTER — Ambulatory Visit
Admission: AD | Admit: 2017-08-23 | Discharge: 2017-08-23 | Disposition: A | Discharge status: Home / Self Care | Payer: PRIVATE HEALTH INSURANCE | Source: Ambulatory Visit | Attending: Nurse Practitioner | Admitting: Nurse Practitioner

## 2017-08-23 DIAGNOSIS — M7918 Myalgia, other site: Secondary | ICD-10-CM | POA: Insufficient documentation

## 2017-08-23 DIAGNOSIS — J111 Influenza due to unidentified influenza virus with other respiratory manifestations: Secondary | ICD-10-CM

## 2017-08-23 DIAGNOSIS — R05 Cough: Secondary | ICD-10-CM | POA: Insufficient documentation

## 2017-08-23 DIAGNOSIS — R69 Illness, unspecified: Secondary | ICD-10-CM | POA: Insufficient documentation

## 2017-08-23 DIAGNOSIS — R0981 Nasal congestion: Secondary | ICD-10-CM | POA: Insufficient documentation

## 2017-08-23 DIAGNOSIS — R509 Fever, unspecified: Secondary | ICD-10-CM | POA: Insufficient documentation

## 2017-08-23 MED ORDER — OSELTAMIVIR PHOSPHATE 75 MG PO CAPS *I*
75.0000 mg | ORAL_CAPSULE | Freq: Two times a day (BID) | ORAL | 0 refills | Status: AC
Start: 2017-08-23 — End: 2017-08-28

## 2017-08-23 MED ORDER — GUAIFENESIN-CODEINE 100-10 MG/5ML PO SYRP *I*
5.0000 mL | ORAL_SOLUTION | Freq: Three times a day (TID) | ORAL | 0 refills | Status: DC | PRN
Start: 2017-08-23 — End: 2017-09-16

## 2017-08-23 MED ORDER — PREDNISONE 20 MG PO TABS *I*
40.0000 mg | ORAL_TABLET | Freq: Every day | ORAL | 0 refills | Status: AC
Start: 2017-08-23 — End: 2017-08-28

## 2017-08-23 NOTE — Discharge Instructions (Signed)
You have been diagnosed with the flu.  Take Tamiflu twice daily ×5 days as discussed.  Use promethazine for nausea as needed.  You are contagious until you have been fever free ×24 hours    Rest and drink plenty of fluids.  Run a humidifier in your bedroom at night  Wash hands frequently to prevent the spread of infection.    Motrin or tylenol every 6 hours as needed for pain.    Mucinex 600mg can help to break up mucous and may be easier to cough it up. You can take this every 12 hours for the next 3-5 days.     Delsym OTC as needed for cough.    Warm salt water gargles and hot tea for a sore throat.  Throat lozengers as needed.    Flonase nasal spray is helpful for nasal congestion and post nasal drip     Saline nasal spray every 4 hours for the next 2-5 days. Netti Pot Sinus rinse can be helpful. Use up to twice daily- once in the morning and once before bed to help with nasal congestion.    Shower steam can be helpful to loosen any mucous and relief congestion. You can try this before bed, or when you get up in the morning.    Elevate your head at night with an extra pillow to help with congestion/reducing cough.    Please seek medical attention if you develop worsening sore throat, fevers, chills, shortness of breath, difficulty breathing,

## 2017-08-23 NOTE — UC Provider Note (Signed)
History     Chief Complaint   Patient presents with    Cough     Symptoms flu symptoms- cough, fever, chills, body aches, sore throat, congestion. started wednesday night with chest tightness and cough (thought related to asthma hx). Has been using inhalers- Received allergy shots on thursday and worsened since then.     Chills    Fever    Shortness of Breath    Pleurisy      42 year old female with a history of anxiety, asthma, and GERD presents for evaluation of flulike symptoms 48 hours.  Endorses headache, fever, chills, nasal congestion, rhinorrhea, postnasal drip, otalgia, sore throat, harsh, hacking, nonproductive cough, fatigue, malaise, and myalgias.  She reports that cough has been steadily worsening, and she has been using her inhaler with minimal relief.  She took 2 Tylenol earlier today without relief.        History provided by:  Patient  Language interpreter used: No    Influenza   Presenting symptoms: cough, fatigue, fever, headache, myalgias, nausea, rhinorrhea, shortness of breath and sore throat    Presenting symptoms: no vomiting    Severity:  Moderate  Onset quality:  Sudden  Duration:  2 days  Progression:  Worsening  Chronicity:  New  Relieved by:  Nothing  Worsened by:  Nothing  Ineffective treatments:  OTC medications  Associated symptoms: chills, decreased appetite, decreased physical activity, ear pain and nasal congestion    Associated symptoms: no neck stiffness and no syncope        Medical/Surgical/Family History     Past Medical History:   Diagnosis Date    Anxiety     Asthma     managed by Dr. Nicholes StairsQuadoo    Complication of anesthesia     hypotension    Fatigue     GERD (gastroesophageal reflux disease)     Herniated lumbar disc without myelopathy     History of rectal abscess     Hyperlipidemia     not being treated    Hypermobility syndrome     Hypoglycemia     Insomnia     Irritable bowel syndrome     Migraines     Morbid obesity     Numbness and tingling      OSA on CPAP     Osteoarthritis     Rash     under hanging skin    Shortness of breath     Stress incontinence     Supraventricular arrhythmia         Patient Active Problem List   Diagnosis Code    BMI 36.0-36.9,adult Z68.36    Morbid obesity E66.01    Asthma J45.909    GERD (gastroesophageal reflux disease) K21.9    Osteoarthritis M19.90    Sleep apnea G47.30    Hyperlipidemia E78.5    BMI 35.0-35.9,adult Z68.35    S/P sleeve, 08/27/16, BMI 36, OSA, GERD, HLD Z98.84    BMI 31.0-31.9,adult Z68.31    BMI 31.0-31.9,adult Z68.31    BMI 31.0-31.9,adult Z68.31    BMI 28.0-28.9,adult Z68.28    Malabsorption K90.9            Past Surgical History:   Procedure Laterality Date    ANNAL FISSURE      & Fistula septic    CESAREAN SECTION, CLASSIC  [id number]    CESAREAN SECTION, UNSPECIFIED      LUMBAR LAMINECTOMY      PERIANAL      REMOVAL OF  VENOUS THROMBOSIS    PR LAP, GAST RESTRICT PROC, LONGITUDINAL GASTRECTOMY N/A 08/27/2016    Procedure: LAPAROSCOPIC GASTRECTOMY SLEEVE;  Surgeon: Darrell Jewel, MD;  Location: [facility];  Service: General    SPINE SURGERY      TONSILLECTOMY AND ADENOIDECTOMY       Family History   Problem Relation Age of Onset    Cancer Mother         glioblastoma    Depression Mother     Early death Mother     Morbid Obesity Mother     Stroke Mother     Osteoarthritis Father     Asthma Father     Cancer Father     High cholesterol Father     Obesity Father     Asthma Sister     Cancer Sister     Osteoarthritis Sister     Osteoarthritis Sister     Migraines Sister     Migraines Sister     Thyroid disease Sister     Thyroid disease Sister     Asthma Sister           Social History   Substance Use Topics    Smoking status: Former Smoker     Quit date: 06/05/2003    Smokeless tobacco: Never Used    Alcohol use No     Living Situation     Questions Responses    Patient lives with     Homeless     Caregiver for other family member     External Services      Employment     Domestic Violence Risk                 Review of Systems   Review of Systems   Constitutional: Positive for chills, decreased appetite, fatigue and fever.   HENT: Positive for congestion, ear pain, postnasal drip, rhinorrhea and sore throat.    Eyes: Negative.    Respiratory: Positive for cough and shortness of breath.    Cardiovascular: Negative.    Gastrointestinal: Positive for nausea. Negative for vomiting.   Musculoskeletal: Positive for myalgias. Negative for arthralgias, back pain and neck stiffness.   Skin: Negative.    Neurological: Positive for headaches.   Psychiatric/Behavioral: Negative.        Physical Exam   Triage Vitals  Triage Start: Start, (08/23/17 2051)   First Recorded BP: 134/86, Temp: 37.2 C (99 F), Temp src: TEMPORAL Oxygen Therapy SpO2: 100 %, Oximetry Source: Lt Hand, O2 Device: None (Room air), Heart Rate: 105, (08/23/17 2053) Heart Rate (via Pulse Ox): 105, (08/23/17 2053).      Physical Exam   Constitutional: She is oriented to person, place, and time. She appears well-developed and well-nourished. She appears ill. No distress.   HENT:   Head: Normocephalic and atraumatic.   Right Ear: A middle ear effusion is present.   Left Ear: A middle ear effusion is present.   Nose: Mucosal edema and rhinorrhea present.   Mouth/Throat: Uvula is midline and mucous membranes are normal. Posterior oropharyngeal edema and posterior oropharyngeal erythema present.   Cardiovascular: Normal rate, regular rhythm and normal heart sounds.    Pulmonary/Chest: Effort normal and breath sounds normal.   Lymphadenopathy:     She has cervical adenopathy.        Right cervical: Superficial cervical adenopathy present.        Left cervical: Superficial cervical adenopathy present.   Neurological: She is  alert and oriented to person, place, and time.   Skin: Skin is warm and dry. She is not diaphoretic.   Psychiatric: She has a normal mood and affect. Her behavior is normal. Judgment and thought  content normal.   Nursing note and vitals reviewed.       Medical Decision Making       No diagnostic studies ordered    Initial Evaluation:  ED First Provider Contact     Date/Time Event User Comments    08/23/17 2055 ED First Provider Contact Wynnie Pacetti Initial Face to Face Provider Contact          Patient was seen on: 08/23/2017        Assessment:  42 y.o.female comes to the Urgent Care Center with flu like symptoms for the past two days    Differential Diagnosis includes:  Influenza  Influenza like illness  Viral URI  Acute nasopharyngitis  CAP  Bronchitis (viral vs. Bacterial)    Plan:   Orders Placed This Encounter    oseltamivir (TAMIFLU) 75 MG capsule    predniSONE (DELTASONE) 20 MG tablet    guaiFENesin-codeine (CHERATUSSIN AC) 100-10 MG/5ML liquid   Patient's symptoms and exam findings consistent with influenza, treated with Tamiflu  Patient declined prescription for Zofran, she has promethazine at home  Prescribed prednisone for airway inflammation and Cheratussin for nighttime cough  Discussed supportive care and return precautions  Advised to follow up with PCP if symptoms worsen    No results found for this or any previous visit (from the past 24 hour(s)).      Final Diagnosis    ICD-10-CM ICD-9-CM   1. Influenza-like illness R69 799.89       Encourage fluids, encourage rest, good hand hygiene.    Use over the counter medications as discussed.    Please start the new medications as below:    Current Discharge Medication List      New Medications    Details Last Dose Given Next Dose Due Script Given?   oseltamivir (TAMIFLU) 75 mg Dose: 75 mg  Take 75 mg by mouth 2 times daily  Quantity 10 capsule, Refill 0  Start date: 08/23/2017, End date: 08/28/2017       Comments: Emergency Encounter            predniSONE (DELTASONE) 40 mg Dose: 40 mg  Take 40 mg by mouth daily    Quantity 10 tablet, Refill 0  Start date: 08/23/2017, End date: 08/28/2017            guaiFENesin-codeine (GUAITUSS AC) 5 mLs Dose: 5  mLs  Take 5 mLs by mouth 3 times daily as needed for Cough    Quantity 60 mL, Refill 0  Start date: 08/23/2017                   Please follow up with your physician as below:    Follow-up Information     Demetra Shiner, MD. Schedule an appointment as soon as possible for a visit   in 3 days.    Specialty:  Internal Medicine  Why:  If symptoms worsen  Contact information:  [address] [place] [address]  [phone]                 Thank you Macyn Aramburo for coming to [facility] for your health care concerns.    If your condition changes and/or worsens please follow up with her primary  doctor and/or return to the urgent care center.    If short of breath, chest pains or any other concerns please report to the emergency room.    In the event of an Emergency dial 911.         Final Diagnosis  Final diagnoses:   [R69] Influenza-like illness (Primary)         Alcide Clever, NP              Alcide Clever, NP  08/25/17 [phone]

## 2017-08-26 ENCOUNTER — Ambulatory Visit: Payer: PRIVATE HEALTH INSURANCE | Admitting: Surgery

## 2017-09-10 ENCOUNTER — Telehealth: Payer: Self-pay | Admitting: Otolaryngology

## 2017-09-10 NOTE — Telephone Encounter (Signed)
Appt rescheduled to 6/10

## 2017-09-10 NOTE — Telephone Encounter (Signed)
Shari Harvey is calling to reschedule her 4/29 appointment with Dr. Man.

## 2017-09-16 ENCOUNTER — Ambulatory Visit
Admission: AD | Admit: 2017-09-16 | Discharge: 2017-09-16 | Disposition: A | Discharge status: Home / Self Care | Payer: PRIVATE HEALTH INSURANCE | Source: Ambulatory Visit | Attending: Family | Admitting: Family

## 2017-09-16 DIAGNOSIS — G43719 Chronic migraine without aura, intractable, without status migrainosus: Secondary | ICD-10-CM | POA: Insufficient documentation

## 2017-09-16 MED ORDER — KETOROLAC TROMETHAMINE 30 MG/ML IJ SOLN *I*
60.0000 mg | Freq: Once | INTRAMUSCULAR | Status: AC
Start: 2017-09-16 — End: 2017-09-16
  Administered 2017-09-16: 60 mg via INTRAMUSCULAR
  Filled 2017-09-16: qty 2

## 2017-09-16 MED ORDER — PREDNISONE 50 MG PO TABS *I*
50.0000 mg | ORAL_TABLET | Freq: Every day | ORAL | 0 refills | Status: AC
Start: 2017-09-16 — End: 2017-09-20

## 2017-09-16 MED ORDER — KETOROLAC TROMETHAMINE 30 MG/ML IJ SOLN *I*
INTRAMUSCULAR | Status: AC
Start: 2017-09-16 — End: 2017-09-16
  Filled 2017-09-16: qty 2

## 2017-09-16 NOTE — ED Triage Notes (Signed)
PCP not notified.        Triage Note   Shari FootmanLisa Haji Delaine, LPN

## 2017-09-16 NOTE — UC Provider Note (Signed)
History     Chief Complaint   Patient presents with    Migraine     x 3-4 days repsonding to imitrex    Nausea     Shari Harvey is a 42 yo female with migraine headache that is not responding to immitrex.  She has had it for 3-4 days.  She is currently under boTox therapy and is doing well with it but when it "wears off" she has bad migraines.  She has completed 2 rounds and her next round is due in a couple of weeks.  She reported it responds well to toradol 60 mg and she would like steroids since it has been going on so long.        History provided by:  Patient  Language interpreter used: No    Headache   Pain location:  Generalized  Quality:  Sharp and stabbing  Radiates to:  L neck, L shoulder, R neck and R shoulder  Severity currently:  9/10  Severity at highest:  10/10  Onset quality:  Gradual  Duration:  4 days  Timing:  Constant  Progression:  Worsening  Chronicity:  Chronic  Similar to prior headaches: yes    Context: activity, bright light and loud noise    Context: not caffeine, not coughing, not defecating, not eating, not stress, not exposure to cold air, not intercourse and not straining    Relieved by:  Nothing  Worsened by:  Light, activity and sound (smells)  Ineffective treatments:  DHE and prescription medications  Associated symptoms: neck pain, neck stiffness and visual change    Associated symptoms: no abdominal pain, no blurred vision, no dizziness, no eye pain, no facial pain, no seizures and no weakness        Medical/Surgical/Family History     Past Medical History:   Diagnosis Date    Anxiety     Asthma     managed by Dr. Nicholes Stairs    Complication of anesthesia     hypotension    Fatigue     GERD (gastroesophageal reflux disease)     Herniated lumbar disc without myelopathy     History of rectal abscess     Hyperlipidemia     not being treated    Hypermobility syndrome     Hypoglycemia     Insomnia     Irritable bowel syndrome     Migraines     Morbid obesity     Numbness and  tingling     OSA on CPAP     Osteoarthritis     Rash     under hanging skin    Shortness of breath     Stress incontinence     Supraventricular arrhythmia         Patient Active Problem List   Diagnosis Code    BMI 36.0-36.9,adult Z68.36    Morbid obesity E66.01    Asthma J45.909    GERD (gastroesophageal reflux disease) K21.9    Osteoarthritis M19.90    Sleep apnea G47.30    Hyperlipidemia E78.5    BMI 35.0-35.9,adult Z68.35    S/P sleeve, 08/27/16, BMI 36, OSA, GERD, HLD Z98.84    BMI 31.0-31.9,adult Z68.31    BMI 31.0-31.9,adult Z68.31    BMI 31.0-31.9,adult Z68.31    BMI 28.0-28.9,adult Z68.28    Malabsorption K90.9            Past Surgical History:   Procedure Laterality Date    ANNAL FISSURE      &  Fistula septic    CESAREAN SECTION, CLASSIC  [id number]    CESAREAN SECTION, UNSPECIFIED      LUMBAR LAMINECTOMY      PERIANAL      REMOVAL OF VENOUS THROMBOSIS    PR LAP, GAST RESTRICT PROC, LONGITUDINAL GASTRECTOMY N/A 08/27/2016    Procedure: LAPAROSCOPIC GASTRECTOMY SLEEVE;  Surgeon: Darrell Jewelmalley, William E, MD;  Location: [facility];  Service: General    SPINE SURGERY      TONSILLECTOMY AND ADENOIDECTOMY       Family History   Problem Relation Age of Onset    Cancer Mother         glioblastoma    Depression Mother     Early death Mother     Morbid Obesity Mother     Stroke Mother     Osteoarthritis Father     Asthma Father     Cancer Father     High cholesterol Father     Obesity Father     Asthma Sister     Cancer Sister     Osteoarthritis Sister     Osteoarthritis Sister     Migraines Sister     Migraines Sister     Thyroid disease Sister     Thyroid disease Sister     Asthma Sister           Social History   Substance Use Topics    Smoking status: Former Smoker     Quit date: 06/05/2003    Smokeless tobacco: Never Used    Alcohol use No     Living Situation     Questions Responses    Patient lives with     Homeless     Caregiver for other family member     External  Services     Employment     Domestic Violence Risk                 Review of Systems   Review of Systems   Eyes: Negative for blurred vision and pain.   Respiratory: Negative for chest tightness.    Gastrointestinal: Negative for abdominal pain.   Musculoskeletal: Positive for neck pain and neck stiffness.   Neurological: Positive for headaches. Negative for dizziness, seizures, syncope, facial asymmetry, speech difficulty, weakness and light-headedness.       Physical Exam   Triage Vitals  Triage Start: Start, (09/16/17 1335)   First Recorded BP: 114/73, Resp: 18, Temp: 36.1 C (97 F), Temp src: TEMPORAL Oxygen Therapy SpO2: 98 %, Oximetry Source: Lt Hand, O2 Device: None (Room air), Heart Rate: 67, (09/16/17 1339)  .      Physical Exam   Constitutional: She is oriented to person, place, and time. She appears well-developed and well-nourished.   HENT:   Head: Normocephalic.   Right Ear: Hearing normal.   Left Ear: Hearing normal.   Mouth/Throat: Uvula is midline, oropharynx is clear and moist and mucous membranes are normal. No oropharyngeal exudate.   Eyes: Pupils are equal, round, and reactive to light. Conjunctivae and EOM are normal.   Neck: Normal range of motion. Neck supple.   Cardiovascular: Normal rate, regular rhythm and normal heart sounds.    Pulmonary/Chest: Effort normal and breath sounds normal.   Neurological: She is alert and oriented to person, place, and time. She displays normal reflexes. No cranial nerve deficit. Coordination normal.   Skin: Skin is warm and dry.   Psychiatric: She has a normal mood and affect. Her  behavior is normal.   Nursing note and vitals reviewed.       Medical Decision Making        Initial Evaluation:  ED First Provider Contact     Date/Time Event User Comments    09/16/17 1331 ED First Provider Contact Octavia Heir, Kaleeya Hancock Initial Face to Face Provider Contact          Patient was seen on: 09/16/2017        Assessment:  41 y.o.female [facility] with  migraine headache    Plan:   Toradol 60 mg im given in clinic, sent home with the following meds:   New Prescriptions    PREDNISONE (DELTASONE) 50 MG TABLET    Take 1 tablet (50 mg total) by mouth daily            Final Diagnosis  Final diagnoses:   [G43.719] Intractable chronic migraine without aura and without status migrainosus (Primary)         Shela Nevin, NP       I have reviewed nursing documentation, confirmed information with patient, and revised with nursing as necessary.       Shela Nevin, NP  09/16/17 1450

## 2017-09-18 ENCOUNTER — Ambulatory Visit: Payer: PRIVATE HEALTH INSURANCE | Attending: Surgery | Admitting: Surgery

## 2017-09-18 ENCOUNTER — Encounter: Payer: Self-pay | Admitting: Surgery

## 2017-09-18 VITALS — BP 129/67 | HR 77 | Temp 97.7°F | Resp 16 | Ht 67.0 in | Wt 173.2 lb

## 2017-09-18 DIAGNOSIS — Z6827 Body mass index (BMI) 27.0-27.9, adult: Secondary | ICD-10-CM | POA: Insufficient documentation

## 2017-09-18 DIAGNOSIS — K909 Intestinal malabsorption, unspecified: Secondary | ICD-10-CM

## 2017-09-18 LAB — COMPREHENSIVE METABOLIC PANEL
ALT: 12 U/L (ref 0–35)
AST: 14 U/L (ref 0–35)
Albumin: 4 g/dL (ref 3.5–5.2)
Alk Phos: 85 U/L (ref 35–105)
Anion Gap: 8 (ref 7–16)
Bilirubin,Total: <0.2 mg/dL (ref 0.0–1.2)
CO2: 23 mmol/L (ref 20–28)
Calcium: 9.1 mg/dL (ref 8.8–10.2)
Chloride: 111 mmol/L — ABNORMAL HIGH (ref 96–108)
Creatinine: 0.75 mg/dL (ref 0.51–0.95)
GFR,Black: 114 *
GFR,Caucasian: 99 *
Globulin: 2.3 g/dL — ABNORMAL LOW (ref 2.7–4.3)
Glucose: 85 mg/dL (ref 60–99)
Lab: 16 mg/dL (ref 6–20)
Potassium: 4 mmol/L (ref 3.3–5.1)
Sodium: 142 mmol/L (ref 133–145)
Total Protein: 6.3 g/dL (ref 6.3–7.7)

## 2017-09-18 LAB — CBC
Hematocrit: 40 % (ref 34–45)
Hemoglobin: 13.3 g/dL (ref 11.2–15.7)
MCH: 31 pg (ref 26–32)
MCHC: 34 g/dL (ref 32–36)
MCV: 91 fL (ref 79–95)
Platelets: 326 10*3/uL (ref 160–370)
RBC: 4.4 MIL/uL (ref 3.9–5.2)
RDW: 12.3 % (ref 11.7–14.4)
WBC: 7.7 10*3/uL (ref 4.0–10.0)

## 2017-09-18 LAB — PTH, INTACT: Intact PTH: 41.8 pg/mL (ref 15.0–65.0)

## 2017-09-18 LAB — FOLATE: Folate: >14 ng/mL (ref >=4.6)

## 2017-09-18 LAB — FERRITIN: Ferritin: 15 ng/mL (ref 10–120)

## 2017-09-18 LAB — VITAMIN B12: Vitamin B12: 1002 pg/mL (ref 232–1245)

## 2017-09-18 LAB — IRON: Iron: 65 ug/dL (ref 34–165)

## 2017-09-18 NOTE — Progress Notes (Signed)
[facility]  Postoperative Visit  Annual    Date of Visit: 09/18/2017   Patient: Shari Harvey   MR Number: [id number]   Date of Birth: 05/17/1976   Date of Surgery: 08/27/2016   Type of Surgery: Laparoscopic Sleeve Gastrectomy         Surgeon: Sol PasserWilliam E. Gershon Mussel'Malley, M.D., F.A.C.S.       Bariatric Vitals:  Vitals 09/18/2017 04/18/2017 01/21/2017 10/31/2016 10/12/2016   Preop Weight 235 lb - 235 lb 235 lb -   Weight 173 lb 3.2 oz 175 lb 183 lb 9.6 oz 198 lb 9.6 oz 200 lb   BAR Weight (lbs) - - 183.6 198.6 -   Height 5\' 7"  5\' 7"  5\' 7"  5\' 7"  -   BAR Height (in) - - 67 67 -   BMI (Calculated) 27.2 kg/m2 27.5 kg/m2 28.8 kg/m2 31.2 kg/m2 -   BAR BMI (Calculated) - - 28.8 kg/m2 31.2 kg/m2 -   IBW in kg (Bariatric) 61.24 61.24 61.24 61.24 -   IBW in lbs (Bariatric) 135 135 135 135 -   Percent Excess Weight Loss -61.84 % -60.03 % -51.42 % -36.42 % -   Weight Change for Visit (kg) -0.82 -3.9 -6.8 -0.63 -0.18   Weight Change Total (kg) -28.03 -27.21 -23.31 -16.51 -15.87   Initial Excess Weight 45.33 45.33 45.33 45.33 -   Total Weight Loss Percent -26.3 % -25.53 % -21.87 % -15.49 % -14.89 %     Vitals 09/18/2017 09/16/2017 08/23/2017 08/04/2017 04/18/2017   BP 129/67 114/73 134/86 120/80 120/62   Pulse 77 67 105 81 81   Resp 16 18 - 16 -   Temp 97.7 97 99 97.5 97.8       ACS Information:  ACS 09/18/2017 09/18/2017 09/16/2017 08/23/2017 08/14/2017   Weight Change Since Preop -28.03 - - - -   DIAGNOSES THIS VISIT - BMI 27.0-27.9,adult  Intestinal malabsorption, unspecified type Intractable chronic migraine without aura and without status migrainosus Influenza-like illness -   TREATMENTS - - - - -       Subjective:  Patient returns for a standard post operative evaluation. A review of pertinent symptoms reveals:  Shari Harvey complains of no issues  Pertinent negatives include      Co-Morbid Conditions:  These comorbid conditions existed prior to surgery: obstructive sleep apnea, osteoarthritis, GERD, asthma, anxiety   .    Patient does not have sleep apnea.  Patient does not have diabetes.   Patient does not have hypertension treated with medication.  Patient does not have GERD treated with PPI or H2  Patient does not have hyperlipidemia treated with medication    Complications:  Patient was not diagnosed with venous thrombosis postoperatively.  Patient was not diagnosed with a pulmonary emobolism postoperatively.  Patient does not have a new incisional hernia    Current Medications, Vitamins, and Supplements list:  Medications/Vitamins/Supplements         Last Dose Start Date End Date Provider     acyclovir (ZOVIRAX) 400 MG tablet Taking  05/31/15  --  [provider]     Take 400 mg by mouth 3 times daily as needed     albuterol HFA 108 (90 BASE) MCG/ACT inhaler As Needed  --  --  [provider]     Inhale 2 puffs into the lungs every 6 hours as needed for Wheezing   Shake well before each use.     calcium citrate-vitamin D (CALCIUM CITRATE +  D) 315-250 MG-UNIT per tablet Taking  --  --  [provider]     Take 2 tablets by mouth 2 times daily     clonazePAM (KLONOPIN) 0.5 MG tablet As Needed  06/13/15  --  [provider]     Take 0.5 mg by mouth 2 times daily as needed   for anxiety     desloratadine (CLARINEX) 5 MG tablet As Needed  --  --  [provider]     Take 5 mg by mouth as needed        diphenhydrAMINE (BENADRYL) 50 MG tablet As Needed  --  --  [provider]     Take 50 mg by mouth nightly as needed for Itching     generic DME Taking  11/25/12  --  Hubert Azure, PA     Lateral unloader brace.     MELATONIN PO As Needed  --  --  [provider]     Take 3 mg by mouth as needed        montelukast (SINGULAIR) 10 MG tablet Taking  03/04/15  --  [provider]     Take 10 mg by mouth daily     Multiple Vitamins-Minerals (MULTI COMPLETE PO) Taking  --  --  [provider]     Take by mouth     predniSONE (DELTASONE) 50 MG tablet  Not Taking  09/16/17  09/20/17  Shela Nevin, NP     Take 1 tablet (50 mg total) by mouth daily     promethazine (PHENERGAN) 25 MG tablet As Needed  08/29/16  --  Elfredia Nevins, MD     Take 1 tablet (25 mg total) by mouth every 4-6 hours as needed for Vomiting     RECTIV 0.4 % ointment Taking  10/18/15  --  [provider]     as needed        sertraline (ZOLOFT) 50 MG tablet Taking  --  --  [provider]     Take 100 mg by mouth daily        SUMAtriptan (IMITREX) 100 MG tablet As Needed  --  --  [provider]     Take 100 mg by mouth as needed for Migraine   Take at onset of headache. May repeat once in 2 hours.     topiramate (TOPAMAX) 200 MG tablet Taking  09/28/15  --  [provider]     Take 200 mg by mouth nightly             Social History:  Shari Harvey  reports that she quit smoking about 14 years ago. She has never used smokeless tobacco.   She  reports that she does not drink alcohol.  Shari Harvey reports she does not use NSAIDS.  She does not attend support meetings.  Shari Harvey exercises 4 times a week.    Diet History:  Shari Harvey does adhere to the current meal plan.  Shari Harvey is not using protein supplements.  She does use caffeine.    Physical Exam:  Physical Exam   Vitals reviewed.  Constitutional: She is oriented to person, place, and time. She appears well-developed and well-nourished.   HENT:   Head: Normocephalic and atraumatic.   Cardiovascular: Normal rate and regular rhythm.    Pulmonary/Chest: Effort normal and breath sounds normal.   Abdominal: Soft. Bowel sounds are normal. She exhibits no distension and no mass. There  is no tenderness. No hernia.   Musculoskeletal: Normal range of motion. She exhibits no edema.   Neurological: She is alert and oriented to person, place, and time.   Skin: Skin is warm and dry.   Psychiatric: She has a normal mood and affect. Her behavior is normal. Judgment and thought content normal.        Assessment:  Patient  is without complaints.  Post op course has been uneventful.  Weight loss has been above average.  She is compliant with diet.       Plan:  Shari Harvey was educated on Diet: lifestyle.     Orders Placed This Encounter   Procedures    Folate    Iron    Vitamin B12    Vitamin d 25 hydroxy, d2&d3    Ferritin    CBC    Comprehensive metabolic panel    PTH, intact       Return in 1 year (on 09/19/2018).

## 2017-09-18 NOTE — Addendum Note (Signed)
Addended by: Windy CannyRAVIS, Amyriah Buras on: 09/18/2017 01:30 PM     Modules accepted: Orders

## 2017-09-23 LAB — VITAMIN D
25-OH VIT D2: <4 ng/mL
25-OH VIT D3: 39 ng/mL
25-OH Vit Total: 39 ng/mL (ref 30–60)

## 2017-09-30 ENCOUNTER — Ambulatory Visit: Payer: PRIVATE HEALTH INSURANCE | Admitting: Otolaryngology

## 2017-10-08 ENCOUNTER — Ambulatory Visit
Admission: AD | Admit: 2017-10-08 | Discharge: 2017-10-08 | Disposition: A | Discharge status: Home / Self Care | Payer: PRIVATE HEALTH INSURANCE | Source: Ambulatory Visit | Attending: Urgent Care | Admitting: Urgent Care

## 2017-10-08 DIAGNOSIS — G43919 Migraine, unspecified, intractable, without status migrainosus: Secondary | ICD-10-CM | POA: Insufficient documentation

## 2017-10-08 DIAGNOSIS — J0101 Acute recurrent maxillary sinusitis: Secondary | ICD-10-CM | POA: Insufficient documentation

## 2017-10-08 MED ORDER — KETOROLAC TROMETHAMINE 30 MG/ML IJ SOLN *I*
INTRAMUSCULAR | Status: AC
Start: 2017-10-08 — End: 2017-10-08
  Filled 2017-10-08: qty 1

## 2017-10-08 MED ORDER — KETOROLAC TROMETHAMINE 30 MG/ML IJ SOLN *I*
30.0000 mg | Freq: Once | INTRAMUSCULAR | Status: AC
Start: 2017-10-08 — End: 2017-10-08
  Administered 2017-10-08: 30 mg via INTRAMUSCULAR
  Filled 2017-10-08: qty 1

## 2017-10-08 NOTE — UC Provider Note (Signed)
History     Chief Complaint   Patient presents with    Migraine     sinus for 1 week, started abx last night     Long time history of Migraine Cephalgia. Most recent migraine attack occurred yesterday after 10 days of sinus symptoms/ Yesterday she was evaluated by her pcp and placed on Doxycycline for sinusitis, today her migraine has worsened. She states that IM Toradol has helped her in the past.            Medical/Surgical/Family History     Past Medical History:   Diagnosis Date    Anxiety     Asthma     managed by Dr. Nicholes Stairs    Complication of anesthesia     hypotension    Fatigue     GERD (gastroesophageal reflux disease)     Herniated lumbar disc without myelopathy     History of rectal abscess     Hyperlipidemia     not being treated    Hypermobility syndrome     Hypoglycemia     Insomnia     Irritable bowel syndrome     Migraines     Morbid obesity     Numbness and tingling     OSA on CPAP     Osteoarthritis     Rash     under hanging skin    Shortness of breath     Stress incontinence     Supraventricular arrhythmia         Patient Active Problem List   Diagnosis Code    BMI 36.0-36.9,adult Z68.36    Morbid obesity E66.01    Asthma J45.909    GERD (gastroesophageal reflux disease) K21.9    Osteoarthritis M19.90    Sleep apnea G47.30    Hyperlipidemia E78.5    BMI 35.0-35.9,adult Z68.35    Bariatric surgery status Z98.84    BMI 31.0-31.9,adult Z68.31    BMI 31.0-31.9,adult Z68.31    BMI 31.0-31.9,adult Z68.31    BMI 28.0-28.9,adult Z68.28    Malabsorption K90.9    BMI 27.0-27.9,adult Z68.27            Past Surgical History:   Procedure Laterality Date    ANNAL FISSURE      & Fistula septic    CESAREAN SECTION, CLASSIC  [id number]    CESAREAN SECTION, UNSPECIFIED      GASTRECTOMY      LUMBAR LAMINECTOMY      PERIANAL      REMOVAL OF VENOUS THROMBOSIS    PR LAP, GAST RESTRICT PROC, LONGITUDINAL GASTRECTOMY N/A 08/27/2016    Procedure: LAPAROSCOPIC GASTRECTOMY  SLEEVE;  Surgeon: Darrell Jewel, MD;  Location: [facility];  Service: General    SPINE SURGERY      TONSILLECTOMY AND ADENOIDECTOMY       Family History   Problem Relation Age of Onset    Cancer Mother         glioblastoma    Depression Mother     Early death Mother     Morbid Obesity Mother     Stroke Mother     Osteoarthritis Father     Asthma Father     Cancer Father     High cholesterol Father     Obesity Father     Asthma Sister     Cancer Sister     Osteoarthritis Sister     Osteoarthritis Sister     Migraines Sister     Migraines  Sister     Thyroid disease Sister     Thyroid disease Sister     Asthma Sister           Social History   Substance Use Topics    Smoking status: Former Smoker     Quit date: 06/05/2003    Smokeless tobacco: Never Used    Alcohol use No     Living Situation     Questions Responses    Patient lives with     Homeless     Caregiver for other family member     External Services     Employment     Domestic Violence Risk                 Review of Systems   Review of Systems   HENT: Positive for congestion, sinus pain and sinus pressure.    Neurological: Positive for headaches. Negative for dizziness, seizures, syncope, facial asymmetry, speech difficulty, weakness, light-headedness and numbness.   Psychiatric/Behavioral: Negative for confusion.   All other systems reviewed and are negative.      Physical Exam   Triage Vitals  Triage Start: Start, (10/08/17 1644)   First Recorded BP: 108/70, Resp: 16, Temp: 35.9 C (96.6 F), Temp src: TEMPORAL Oxygen Therapy SpO2: 99 %, Oximetry Source: Lt Hand, O2 Device: None (Room air), Heart Rate: 79, (10/08/17 1646) Heart Rate (via Pulse Ox): 79, (10/08/17 1646).      Physical Exam   Constitutional: She is oriented to person, place, and time. She appears well-developed and well-nourished.   HENT:   Head: Normocephalic and atraumatic.   Right Ear: External ear normal.   Left Ear: External ear normal.   Mouth/Throat:  Oropharynx is clear and moist.   Eyes: Pupils are equal, round, and reactive to light. Conjunctivae and EOM are normal.   Neck: Normal range of motion.   Cardiovascular: Normal rate.    Pulmonary/Chest: Effort normal.   Neurological: She is alert and oriented to person, place, and time.   Skin: Skin is warm and dry.   Psychiatric: She has a normal mood and affect. Her behavior is normal. Thought content normal.   Nursing note and vitals reviewed.       Medical Decision Making        Initial Evaluation:  ED First Provider Contact     Date/Time Event User Comments    10/08/17 1654 ED First Provider Contact Yamari Ventola Initial Face to Face Provider Contact          Patient was seen on: 10/08/2017        Assessment:  42 y.o.female comes [facility] the [facility] with a recurrent migraine headache    Differential Diagnosis includes:  Headache    Plan: Toradol 30 mg IM here.  Follow up with PCP         Final Diagnosis  Final diagnoses:   [J01.01] Acute recurrent maxillary sinusitis (Primary)   [G43.919] Intractable migraine without status migrainosus, unspecified migraine type         Camdyn Laden, PA       I have reviewed nursing documentation, confirmed information with patient, and revised with nursing as necessary.       Telford Nab, [place]  10/08/17 1723

## 2017-10-19 ENCOUNTER — Ambulatory Visit
Admission: AD | Admit: 2017-10-19 | Discharge: 2017-10-19 | Disposition: A | Discharge status: Home / Self Care | Payer: PRIVATE HEALTH INSURANCE | Source: Ambulatory Visit | Attending: Emergency Medicine | Admitting: Emergency Medicine

## 2017-10-19 DIAGNOSIS — J329 Chronic sinusitis, unspecified: Secondary | ICD-10-CM

## 2017-10-19 MED ORDER — CEFDINIR 300 MG PO CAPS *I*
300.0000 mg | ORAL_CAPSULE | Freq: Two times a day (BID) | ORAL | 0 refills | Status: AC
Start: 2017-10-19 — End: 2017-11-02

## 2017-10-19 NOTE — Discharge Instructions (Signed)
Follow up with her ENT surgeon as scheduled.    Continue to use your asthma and allergy medications as before.

## 2017-10-19 NOTE — ED Triage Notes (Signed)
Ill x 3 weeks  Saw PMD 10 days ago put on Doxy for sinus infection  Since then allergies kicked in and cough/wheezing  Finished doxy on Thurs.  Sputum yellow  Not feeling better  History chronic sinusitis - followed by ENT  Has appt in June  Saw "asthma doctor" yesterday - told asthma was OK  Increased sinus symptoms started later yesterday       Triage Note   Philmore Pali, RN

## 2017-10-19 NOTE — UC Provider Note (Signed)
History     Chief Complaint   Patient presents with    Sinusitis     Patient has known chronic sinusitis.  Has had surgery on her sinuses when she was in her 29s with excellent results for about 20 years.  However she now gets recurrent sinus disease.  She has an ENT who was going to see her first week of this next month for evaluation of possible surgery.  She had been on doxycycline, 2 rounds, but stopped taking it about 3 days ago.  Her asthma has been acting up and she saw her allergist yesterday she was not put on prednisone.  Her main complaint today is frontal sinus pressure and pain with green to yellow discharge consistent with an acute on chronic sinus infection she states.        History provided by:  Patient  Language interpreter used: No        Medical/Surgical/Family History     Past Medical History:   Diagnosis Date    Anxiety     Asthma     managed by Dr. Nicholes Stairs    Complication of anesthesia     hypotension    Fatigue     GERD (gastroesophageal reflux disease)     Herniated lumbar disc without myelopathy     History of rectal abscess     Hyperlipidemia     not being treated    Hypermobility syndrome     Hypoglycemia     Insomnia     Irritable bowel syndrome     Migraines     Morbid obesity     Numbness and tingling     OSA on CPAP     Osteoarthritis     Rash     under hanging skin    Shortness of breath     Stress incontinence     Supraventricular arrhythmia         Patient Active Problem List   Diagnosis Code    BMI 36.0-36.9,adult Z68.36    Morbid obesity E66.01    Asthma J45.909    GERD (gastroesophageal reflux disease) K21.9    Osteoarthritis M19.90    Sleep apnea G47.30    Hyperlipidemia E78.5    BMI 35.0-35.9,adult Z68.35    Bariatric surgery status Z98.84    BMI 31.0-31.9,adult Z68.31    BMI 31.0-31.9,adult Z68.31    BMI 31.0-31.9,adult Z68.31    BMI 28.0-28.9,adult Z68.28    Malabsorption K90.9    BMI 27.0-27.9,adult Z68.27            Past Surgical  History:   Procedure Laterality Date    ANNAL FISSURE      & Fistula septic    CESAREAN SECTION, CLASSIC  [id number]    CESAREAN SECTION, UNSPECIFIED      GASTRECTOMY      LUMBAR LAMINECTOMY      PERIANAL      REMOVAL OF VENOUS THROMBOSIS    PR LAP, GAST RESTRICT PROC, LONGITUDINAL GASTRECTOMY N/A 08/27/2016    Procedure: LAPAROSCOPIC GASTRECTOMY SLEEVE;  Surgeon: Darrell Jewel, MD;  Location: [facility];  Service: General    SPINE SURGERY      TONSILLECTOMY AND ADENOIDECTOMY       Family History   Problem Relation Age of Onset    Cancer Mother         glioblastoma    Depression Mother     Early death Mother     Morbid Obesity Mother  Stroke Mother     Osteoarthritis Father     Asthma Father     Cancer Father     High cholesterol Father     Obesity Father     Asthma Sister     Cancer Sister     Osteoarthritis Sister     Osteoarthritis Sister     Migraines Sister     Migraines Sister     Thyroid disease Sister     Thyroid disease Sister     Asthma Sister           Social History   Substance Use Topics    Smoking status: Former Smoker     Quit date: 06/05/2003    Smokeless tobacco: Never Used    Alcohol use No     Living Situation     Questions Responses    Patient lives with     Homeless     Caregiver for other family member     External Services     Employment     Domestic Violence Risk                 Review of Systems   Review of Systems   Constitutional: Negative for diaphoresis, fatigue and fever.   HENT: Positive for congestion, postnasal drip, rhinorrhea, sinus pain, sinus pressure and sneezing. Negative for nosebleeds, sore throat and trouble swallowing.    Eyes: Negative for pain, redness and itching.   Respiratory: Positive for cough and wheezing. Negative for chest tightness and shortness of breath.    Cardiovascular: Negative for chest pain.   Gastrointestinal: Negative for diarrhea and nausea.   Endocrine: Negative for polydipsia and polyphagia.   Genitourinary:  Negative for difficulty urinating, dysuria and hematuria.   Musculoskeletal: Positive for myalgias. Negative for arthralgias.   Skin: Negative.    Neurological: Negative for dizziness and weakness.   Hematological: Negative for adenopathy.   Psychiatric/Behavioral: Negative.    All other systems reviewed and are negative.      Physical Exam   Triage Vitals  Triage Start: Start, (10/19/17 0945)   First Recorded BP: 117/73, Resp: 16, Temp: 36.5 C (97.7 F), Temp src: TEMPORAL Oxygen Therapy SpO2: 98 %, Oximetry Source: Lt Hand, O2 Device: None (Room air), Heart Rate: 107, (10/19/17 0936)  .      Physical Exam   Constitutional: She is oriented to person, place, and time.   Appears fatigued   HENT:   Nose: Mucosal edema and rhinorrhea present.   Mouth/Throat: Oropharyngeal exudate present.   Significant frontal sinus tenderness   Eyes: Pupils are equal, round, and reactive to light. EOM are normal. Right eye exhibits no discharge. Left eye exhibits no discharge.   Neck: Normal range of motion.   Cardiovascular: Normal rate and regular rhythm.    Pulmonary/Chest: Effort normal and breath sounds normal. She has no wheezes.   Abdominal: Soft.   Musculoskeletal: Normal range of motion.   Lymphadenopathy:     She has no cervical adenopathy.   Neurological: She is alert and oriented to person, place, and time.   Skin: Skin is warm.   Psychiatric: She has a normal mood and affect.   Nursing note and vitals reviewed.       Medical Decision Making        Initial Evaluation:  ED First Provider Contact     Date/Time Event User Comments    10/19/17 [phone] ED First Provider Contact Mammie Russian H Initial Face to Face  Provider Contact          Patient was seen on: 10/19/2017        Assessment:  42 y.o.female [facility] with sinus pressure with yellow drainage starting in the last 2 days.  Has a long history of sinus infection and disease.  No migraine today.  Lungs are clear.    Differential Diagnosis  includes:  Bacterial sinusitis  Viral Sinusitis  Allergic Rhinitis  Nasopharyngitis  Rhinosinusitis  Acute URI  Bronchitis   Tension HA      Plan: She has been on doxycycline for 2 rounds in the recent past.  At this point is prudent to change her antibiotic to third generation cephalosporin pending ENT evaluation.  No prednisone.  She will see her ENT doctor for further follow-up and possible surgery.         Final Diagnosis  Final diagnoses:   [J32.9] Chronic sinusitis, unspecified location (Primary)         Alvester Morin, MD              Alvester Morin, MD  10/19/17 1004

## 2017-10-21 ENCOUNTER — Ambulatory Visit
Admission: AD | Admit: 2017-10-21 | Discharge: 2017-10-21 | Disposition: A | Discharge status: Home / Self Care | Payer: PRIVATE HEALTH INSURANCE | Source: Ambulatory Visit | Attending: Emergency Medicine | Admitting: Emergency Medicine

## 2017-10-21 DIAGNOSIS — J329 Chronic sinusitis, unspecified: Secondary | ICD-10-CM

## 2017-10-21 DIAGNOSIS — G43019 Migraine without aura, intractable, without status migrainosus: Secondary | ICD-10-CM

## 2017-10-21 MED ORDER — OMEPRAZOLE 20 MG PO CPDR *I*
20.0000 mg | DELAYED_RELEASE_CAPSULE | Freq: Every day | ORAL | 0 refills | Status: AC
Start: 2017-10-21 — End: 2018-02-10

## 2017-10-21 MED ORDER — KETOROLAC TROMETHAMINE 30 MG/ML IJ SOLN *I*
30.0000 mg | Freq: Once | INTRAMUSCULAR | Status: AC
Start: 2017-10-21 — End: 2017-10-21
  Administered 2017-10-21: 30 mg via INTRAMUSCULAR

## 2017-10-21 MED ORDER — PREDNISONE 20 MG PO TABS *I*
ORAL_TABLET | ORAL | 0 refills | Status: DC
Start: 2017-10-21 — End: 2017-11-03

## 2017-10-21 NOTE — Discharge Instructions (Signed)
PLEASE REVIEW ALL INSTRUCTIONS FOR DETAILS AND CONTENT CONTAINED IN THE DISCHARGE MATERIALS NOT COVERED AT DISCHARGE.    Thank you Shari Harvey for coming to [facility] for your health care concerns.    If your condition changes and/or worsens please follow up with your primary doctor and/or return to the urgent care center.    In the event of an emergency please dial 911.

## 2017-10-21 NOTE — UC Provider Note (Signed)
History     Chief Complaint   Patient presents with    Headache     Getting over sinus infection- was here 2 days ago and started antibiotic. Has migraine on right side of head which has been ongoing for a couple of days- taking imitrex but she is out of it. States she usually gets migraines when getting over sinus infection. Last took imitrex  year old female presents for evaluation of a migraine headache which started a few days ago.  Patient was previously seen over the weekend for chronic sinusitis and treated with antibiotics.  "I get a migraine every time this happens" she states.  Patient took her previously prescribed Imitrex but states she has run out.  The headache is frontal and feels like her typical migraine.  She also feels nauseous and took a promethazine tablet this morning.  She denies any visual changes, dizziness/lightheadedness, or altered mental status.  This is not the worst headache of her life and states after taking the medications earlier today her headache is around 3-4/10.  She also inquires about starting prednisone for her sinuses.  She has an ENT appointment scheduled June 10th.         History provided by:  Patient  Language interpreter used: No        Medical/Surgical/Family History     Past Medical History:   Diagnosis Date    Anxiety     Asthma     managed by Dr. Nicholes Stairs    Complication of anesthesia     hypotension    Fatigue     GERD (gastroesophageal reflux disease)     Herniated lumbar disc without myelopathy     History of rectal abscess     Hyperlipidemia     not being treated    Hypermobility syndrome     Hypoglycemia     Insomnia     Irritable bowel syndrome     Migraines     Morbid obesity     Numbness and tingling     OSA on CPAP     Osteoarthritis     Rash     under hanging skin    Shortness of breath     Stress incontinence     Supraventricular arrhythmia         Patient Active Problem List   Diagnosis Code    BMI  36.0-36.9,adult Z68.36    Morbid obesity E66.01    Asthma J45.909    GERD (gastroesophageal reflux disease) K21.9    Osteoarthritis M19.90    Sleep apnea G47.30    Hyperlipidemia E78.5    BMI 35.0-35.9,adult Z68.35    Bariatric surgery status Z98.84    BMI 31.0-31.9,adult Z68.31    BMI 31.0-31.9,adult Z68.31    BMI 31.0-31.9,adult Z68.31    BMI 28.0-28.9,adult Z68.28    Malabsorption K90.9    BMI 27.0-27.9,adult Z68.27            Past Surgical History:   Procedure Laterality Date    ANNAL FISSURE      & Fistula septic    CESAREAN SECTION, CLASSIC  1610,9604    CESAREAN SECTION, UNSPECIFIED      GASTRECTOMY      LUMBAR LAMINECTOMY      PERIANAL      REMOVAL OF VENOUS THROMBOSIS    PR LAP, GAST RESTRICT PROC, LONGITUDINAL GASTRECTOMY N/A 08/27/2016    Procedure: LAPAROSCOPIC GASTRECTOMY SLEEVE;  Surgeon: Darrell Jewel, MD;  Location: [facility];  Service: General    SPINE SURGERY      TONSILLECTOMY AND ADENOIDECTOMY       Family History   Problem Relation Age of Onset    Cancer Mother         glioblastoma    Depression Mother     Early death Mother     Morbid Obesity Mother     Stroke Mother     Osteoarthritis Father     Asthma Father     Cancer Father     High cholesterol Father     Obesity Father     Asthma Sister     Cancer Sister     Osteoarthritis Sister     Osteoarthritis Sister     Migraines Sister     Migraines Sister     Thyroid disease Sister     Thyroid disease Sister     Asthma Sister           Social History   Substance Use Topics    Smoking status: Former Smoker     Quit date: 06/05/2003    Smokeless tobacco: Never Used    Alcohol use No     Living Situation     Questions Responses    Patient lives with     Homeless     Caregiver for other family member     External Services     Employment     Domestic Violence Risk                 Review of Systems   Review of Systems   Constitutional: Positive for fatigue. Negative for chills and fever.   HENT: Positive  for congestion, sinus pain and sinus pressure.    Respiratory: Positive for cough.    Gastrointestinal: Positive for nausea.   Musculoskeletal: Negative.    Skin: Negative.    Neurological: Positive for headaches.   Hematological: Negative.    Psychiatric/Behavioral: Negative.        Physical Exam   Triage Vitals  Triage Start: Start, (10/21/17 1716)   First Recorded BP: 117/73, Resp: 20, Temp: 36 C (96.8 F), Temp src: TEMPORAL Oxygen Therapy SpO2: 96 %, Oximetry Source: Lt Hand, O2 Device: None (Room air), Heart Rate: 80, (10/21/17 1720)  .      Physical Exam   Constitutional: She is oriented to person, place, and time. She appears well-developed and well-nourished. She appears distressed.   HENT:   Head: Normocephalic.   Right Ear: Tympanic membrane, external ear and ear canal normal.   Left Ear: Tympanic membrane, external ear and ear canal normal.   Nose: Mucosal edema and rhinorrhea present. Right sinus exhibits no maxillary sinus tenderness and no frontal sinus tenderness. Left sinus exhibits no maxillary sinus tenderness and no frontal sinus tenderness.   Mouth/Throat: Oropharynx is clear and moist. No oropharyngeal exudate, posterior oropharyngeal edema, posterior oropharyngeal erythema or tonsillar abscesses.   Eyes: Pupils are equal, round, and reactive to light. EOM are normal.   Neck: Normal range of motion.   Cardiovascular: Normal heart sounds.    Pulmonary/Chest: Effort normal and breath sounds normal. No respiratory distress. She has no wheezes. She has no rales. She exhibits no tenderness.   Musculoskeletal: Normal range of motion.   Lymphadenopathy:     She has no cervical adenopathy.   Neurological: She is alert and oriented to person, place, and time.   Skin: Skin is warm and dry. She is  not diaphoretic.   Psychiatric: She has a normal mood and affect. Her behavior is normal. Judgment and thought content normal.   Nursing note and vitals reviewed.       Medical Decision Making        Initial  Evaluation:  ED First Provider Contact     Date/Time Event User Comments    10/21/17 1735 ED First Provider Contact Alroy Bailiff Initial Face to Face Provider Contact          Patient was seen on: 10/21/2017        Assessment:  42 y.o.female comes to the Urgent Care Center with Migraine headache after being  treated for chronic sinusitis.  Patient states this is usual migraine for her however she has run out of medication.    Differential Diagnosis includes:  Migraine Headache  Cluster Headache  Tension Headache  Acute Bacterial Sinusitis      Plan: Patient given Toradol 30 mg IM.  She politely declines anything for nausea.  Patient noted moderate relief with Toradol.  We'll prescribe prednisone for chronic sinusitis.  Patient also requires about Prilosec; history of gastric sleeve.  Orders Placed This Encounter    ketorolac (TORADOL) 30 mg/mL injection 30 mg    predniSONE (DELTASONE) 20 MG tablet    omeprazole (PRILOSEC) 20 MG capsule       No results found for this or any previous visit (from the past 24 hour(s)).        Final Diagnosis    ICD-10-CM ICD-9-CM   1. Intractable migraine without aura and without status migrainosus G43.019 346.11   2. Chronic sinusitis, unspecified location J32.9 473.9       Encourage fluids, encourage rest, good hand hygiene.    Use over the counter medications as discussed.    Please start the new medications as below:    Current Discharge Medication List      New Medications    Details Last Dose Given Next Dose Due Script Given?   predniSONE (DELTASONE) 20 MG tablet 2 tabs PO x 5 days  Quantity 10 tablet, Refill 0  Start date: 10/21/2017       Comments: Emergency Encounter            omeprazole (PriLOSEC) 20 mg Dose: 20 mg  Take 20 mg by mouth daily (before breakfast)    Quantity 30 capsule, Refill 0  Start date: 10/21/2017, End date: 11/20/2017       Comments: Emergency Encounter                   Please follow up with your physician as below:    Follow-up Information     Demetra Shiner, MD.    Specialty:  Internal Medicine  Why:  As needed, If symptoms worsen  Contact information:  [address] [place] [address]  [phone]                 Thank you Naeemah Rieke for coming to [facility] for your health care concerns.    If your condition changes and/or worsens please follow up with her primary doctor and/or return to the urgent care center.    If short of breath, chest pains or any other concerns please report to the emergency room.    In the event of an Emergency dial 911.         Final Diagnosis  Final diagnoses:   [G43.019] Intractable migraine without aura and without  status migrainosus (Primary)   [J32.9] Chronic sinusitis, unspecified location         [employer], PA              Corky Downs [place], [place]  10/21/17 1818

## 2017-11-02 ENCOUNTER — Ambulatory Visit
Admission: AD | Admit: 2017-11-02 | Discharge: 2017-11-02 | Disposition: A | Discharge status: Home / Self Care | Payer: PRIVATE HEALTH INSURANCE | Source: Ambulatory Visit | Attending: Urology | Admitting: Urology

## 2017-11-02 DIAGNOSIS — G43109 Migraine with aura, not intractable, without status migrainosus: Secondary | ICD-10-CM | POA: Insufficient documentation

## 2017-11-02 MED ORDER — KETOROLAC TROMETHAMINE 30 MG/ML IJ SOLN *I*
60.0000 mg | Freq: Once | INTRAMUSCULAR | Status: AC
Start: 2017-11-02 — End: 2017-11-02
  Administered 2017-11-02: 60 mg via INTRAMUSCULAR

## 2017-11-02 MED ORDER — SUMATRIPTAN SUCCINATE 100 MG PO TABS *I*
100.0000 mg | ORAL_TABLET | ORAL | 0 refills | Status: AC | PRN
Start: 2017-11-02 — End: ?

## 2017-11-02 NOTE — Discharge Instructions (Signed)
Can take Imitrex 100 mg, and repeat in 2 hours once.  Can alternate between ibuprofen ( do not take more than 600 mg at once and take with food to prevent GI upset ) and Tylenol 650 mg (do not exceed max daily dose of 3,000 mg in 24 hrs)    Symptom management:  Place an ice pack or a bag of frozen peas wrapped in a towel over your head. Never put ice right on the skin. Do not leave the ice on more than 10 to 15 minutes at a time.  Using heat may also help. Try using a heating pad on the back of your neck. A warm shower or bath may also relax tight muscles.  Avoid bright lights and noise. Rest in a quiet, dark room. Sleep may also help.  Drink a caffeinated beverage. Be careful to not drink too much caffeine as this can cause other headaches.      To help prevent onset of migraines:  Do not skip or delay meals. Drink 6 to 8 glasses of water each day. This may help prevent migraines.  Avoid foods that may trigger the attack. These may include processed, fermented, pickled, or marinated foods. Some of these are baked goods, chocolate, dairy products, nuts, onions, and peanut butter. Others are fruits like avocado, banana, or citrus fruit and meats like bacon, hot dogs, and cured meats.  Limit caffeine intake. You will find that caffeine may help relieve pain. But, too much caffeine may also trigger an attack.  Avoid drinking beer, wine, and mixed drinks (alcohol) if you think this is causing your migraines.  Quit smoking. Smoking can worsen your migraine.    Seek immediate help at your nearest ED if you experience the following:  Your migraine becomes really bad.  You have a fever.  You have a stiff neck.  You have trouble seeing.  Your muscles are weak, or you lose muscle control.  You experience numbness and tingling in your extremities  You lose your balance or have trouble walking.  You feel like you will pass out (faint), or you pass out.  You have really bad symptoms that are different than your first  symptoms.

## 2017-11-02 NOTE — ED Triage Notes (Signed)
Pt has had a HA that started last night. States he other migraine symptoms, aura, started 2 days ago. States she went to the dentist 2 days ago and started to feel pain to left sided facial pain and sinus pain traveling to left side of head. She has been out of her migraine medicine for 1-2 weeks. Tramadol taken for pain prior to arrival.        Triage Note   Devonne DoughtyMeghan Elnoria Livingston, RN

## 2017-11-02 NOTE — UC Provider Note (Signed)
History     Chief Complaint   Patient presents with    Migraine     Shari Harvey is a 42 y.o. Female, with a pmh of migraines, presents with a left-sided migraine that started last night.  Reports that 2 days ago she went dentist began to feel pain behind her left eye this is since progressed to a migraine.  She reports photophobia.  Denies fever, chills, nausea, vomiting, visual changes, numbness or tingling.  Patient reports that she usually takes her Imitrex however she is out of this medication.  She states that Toradol often improves her migraines.        History provided by:  Patient  Language interpreter used: No        Medical/Surgical/Family History     Past Medical History:   Diagnosis Date    Anxiety     Asthma     managed by Dr. Nicholes Stairs    Complication of anesthesia     hypotension    Fatigue     GERD (gastroesophageal reflux disease)     Herniated lumbar disc without myelopathy     History of rectal abscess     Hyperlipidemia     not being treated    Hypermobility syndrome     Hypoglycemia     Insomnia     Irritable bowel syndrome     Migraines     Morbid obesity     Numbness and tingling     OSA on CPAP     Osteoarthritis     Rash     under hanging skin    Shortness of breath     Stress incontinence     Supraventricular arrhythmia         Patient Active Problem List   Diagnosis Code    BMI 36.0-36.9,adult Z68.36    Morbid obesity E66.01    Asthma J45.909    GERD (gastroesophageal reflux disease) K21.9    Osteoarthritis M19.90    Sleep apnea G47.30    Hyperlipidemia E78.5    BMI 35.0-35.9,adult Z68.35    Bariatric surgery status Z98.84    BMI 31.0-31.9,adult Z68.31    BMI 31.0-31.9,adult Z68.31    BMI 31.0-31.9,adult Z68.31    BMI 28.0-28.9,adult Z68.28    Malabsorption K90.9    BMI 27.0-27.9,adult Z68.27            Past Surgical History:   Procedure Laterality Date    ANNAL FISSURE      & Fistula septic    CESAREAN SECTION, CLASSIC  1610,9604    CESAREAN  SECTION, UNSPECIFIED      GASTRECTOMY      LUMBAR LAMINECTOMY      PERIANAL      REMOVAL OF VENOUS THROMBOSIS    PR LAP, GAST RESTRICT PROC, LONGITUDINAL GASTRECTOMY N/A 08/27/2016    Procedure: LAPAROSCOPIC GASTRECTOMY SLEEVE;  Surgeon: Darrell Jewel, MD;  Location: [facility];  Service: General    SPINE SURGERY      TONSILLECTOMY AND ADENOIDECTOMY       Family History   Problem Relation Age of Onset    Cancer Mother         glioblastoma    Depression Mother     Early death Mother     Morbid Obesity Mother     Stroke Mother     Osteoarthritis Father     Asthma Father     Cancer Father     High cholesterol Father  Obesity Father     Asthma Sister     Cancer Sister     Osteoarthritis Sister     Osteoarthritis Sister     Migraines Sister     Migraines Sister     Thyroid disease Sister     Thyroid disease Sister     Asthma Sister           Social History   Substance Use Topics    Smoking status: Former Smoker     Quit date: 06/05/2003    Smokeless tobacco: Never Used    Alcohol use No     Living Situation     Questions Responses    Patient lives with     Homeless     Caregiver for other family member     External Services     Employment     Domestic Violence Risk                 Review of Systems   Review of Systems   Constitutional: Negative for chills, fatigue and fever.   HENT: Negative for congestion, dental problem and facial swelling.    Eyes: Positive for photophobia. Negative for discharge and visual disturbance.   Respiratory: Negative for cough and shortness of breath.    Cardiovascular: Negative for chest pain.   Gastrointestinal: Negative for nausea and vomiting.   Genitourinary: Negative for difficulty urinating.   Skin: Negative for rash.   Neurological: Positive for headaches. Negative for dizziness, weakness, light-headedness and numbness.   Psychiatric/Behavioral: Negative for confusion.       Physical Exam   Triage Vitals  Triage Start: Start, (11/02/17 1037)   First  Recorded BP: 119/66, Resp: 18, Temp: 36.5 C (97.7 F), Temp src: TEMPORAL Oxygen Therapy SpO2: 98 %, O2 Device: None (Room air), Heart Rate: 85, (11/02/17 1042)  .  First Pain Reported  0-10 Scale: 7, Pain Location/Orientation: Back;Head, (11/02/17 1042)       Physical Exam   Constitutional: She is oriented to person, place, and time. She appears well-developed and well-nourished. No distress.   HENT:   Head: Normocephalic and atraumatic.       Right Ear: External ear normal.   Left Ear: External ear normal.   Nose: Nose normal.   Mouth/Throat: Oropharynx is clear and moist. No oropharyngeal exudate.   Bilateral sinuses are nontender to palpation   Eyes: Pupils are equal, round, and reactive to light. Conjunctivae, EOM and lids are normal. Lids are everted and swept, no foreign bodies found.   Neck: Normal range of motion.   Cardiovascular: Normal rate, regular rhythm, normal heart sounds and intact distal pulses.    Pulmonary/Chest: Effort normal and breath sounds normal.   Musculoskeletal: Normal range of motion.   Neurological: She is alert and oriented to person, place, and time.   Skin: Skin is warm. Capillary refill takes 2 to 3 seconds. She is not diaphoretic.   Psychiatric: She has a normal mood and affect. Her behavior is normal. Judgment and thought content normal.   Nursing note and vitals reviewed.       Medical Decision Making        Initial Evaluation:  ED First Provider Contact     Date/Time Event User Comments    11/02/17 1047 ED First Provider Contact Yolander Goodie I. Initial Face to Face Provider Contact          Patient was seen on: 11/02/2017        Assessment:  41 y.o.female [facility] with figuring that started last evening, patient no longer has her Imitrex.  Patient is afebrile, VSS, and seated comfortably in a dark room.  EOMs intact.  Migraine is localized to the left side of face.  The rest of the exam is unremarkable.      Differential Diagnosis  includes:  Migraine  Cluster HA  Tension HA  Sinus headache      Plan:   Orders Placed This Encounter    ketorolac (TORADOL) 30 mg/mL injection 60 mg    SUMAtriptan (IMITREX) 100 MG tablet     --patient received Toradol, tolerated well without complications.  Reports pain deceased from a 11-Mar-2023 to a 12-09-2022  -Will prescribe 2 doses of 100 mg of Imitrex upon discharge.    Final Diagnosis    ICD-10-CM ICD-9-CM   1. Migraine with aura and without status migrainosus, not intractable G43.109 346.00     Patient instructions:     Can take Imitrex 100 mg, and repeat in 2 hours once.  Can alternate between ibuprofen ( do not take more than 600 mg at once and take with food to prevent GI upset ) and Tylenol 650 mg (do not exceed max daily dose of 3,000 mg in 24 hrs)    Symptom management:   Place an ice pack or a bag of frozen peas wrapped in a towel over your head. Never put ice right on the skin. Do not leave the ice on more than 10 to 15 minutes at a time.   Using heat may also help. Try using a heating pad on the back of your neck. A warm shower or bath may also relax tight muscles.   Avoid bright lights and noise. Rest in a quiet, dark room. Sleep may also help.   Drink a caffeinated beverage. Be careful to not drink too much caffeine as this can cause other headaches.      To help prevent onset of migraines:   Do not skip or delay meals. Drink 6 to 8 glasses of water each day. This may help prevent migraines.   Avoid foods that may trigger the attack. These may include processed, fermented, pickled, or marinated foods. Some of these are baked goods, chocolate, dairy products, nuts, onions, and peanut butter. Others are fruits like avocado, banana, or citrus fruit and meats like bacon, hot dogs, and cured meats.   Limit caffeine intake. You will find that caffeine may help relieve pain. But, too much caffeine may also trigger an attack.   Avoid drinking beer, wine, and mixed drinks (alcohol) if you think this is  causing your migraines.   Quit smoking. Smoking can worsen your migraine.    Seek immediate help at your nearest ED if you experience the following:   Your migraine becomes really bad.   You have a fever.   You have a stiff neck.   You have trouble seeing.   Your muscles are weak, or you lose muscle control.   You experience numbness and tingling in your extremities   You lose your balance or have trouble walking.   You feel like you will pass out (faint), or you pass out.   You have really bad symptoms that are different than your first symptoms.        Please start the new medications as below:  Current Discharge Medication List      New Medications    Details Last Dose Given Next  Dose Due Script Given?   SUMAtriptan (IMITREX) 100 mg Dose: 100 mg  Take 100 mg by mouth as needed for Migraine   Take at onset of headache. May repeat once in 2 hours.   Quantity 2 tablet, Refill 0  Start date: 11/02/2017                 Please follow up with your physician as below:  Follow-up Information     Demetra ShinerKopp, David, MD. Schedule an appointment as soon as possible for a visit   in 3 days.    Specialty:  Internal Medicine  Why:  As needed, If symptoms worsen, if symptoms do not improve  Contact information:  [address] [place]  [place] [id number]  [phone]               Patient comfortable with plan for discharge.  All questions and concerns were addressed.           Final Diagnosis  Final diagnoses:   [G43.109] Migraine with aura and without status migrainosus, not intractable (Primary)         Evangeline DakinAlexandra I Chong January, NP              Annett GulaSkwirz, Curstin Schmale Isabel, NP  11/02/17 2131

## 2017-11-03 ENCOUNTER — Ambulatory Visit
Admission: AD | Admit: 2017-11-03 | Discharge: 2017-11-03 | Disposition: A | Discharge status: Home / Self Care | Payer: PRIVATE HEALTH INSURANCE | Source: Ambulatory Visit | Attending: Urology | Admitting: Urology

## 2017-11-03 DIAGNOSIS — J329 Chronic sinusitis, unspecified: Secondary | ICD-10-CM | POA: Insufficient documentation

## 2017-11-03 DIAGNOSIS — J029 Acute pharyngitis, unspecified: Secondary | ICD-10-CM | POA: Insufficient documentation

## 2017-11-03 NOTE — ED Triage Notes (Signed)
Patient reports facial pressure for last three days. Patient reports was here yesterday for similar symptoms. Patient also reports nasal discharge, denies any fevers /chills.     Triage Note   Farrell Oursaroline Gale Hulse, RN

## 2017-11-03 NOTE — Discharge Instructions (Signed)
Please purchase and utilize over-the-counter Claritin or Zyrtec once daily.    You may use warm compresses (i.e. Washcloth warmed up with hot water, 10 minutes at a time) on bilateral sinuses 4-5 times daily for symptom relief.    Utilizing steam/humidified air from hot showers/steam baths may also help with sinus congestion symptoms.    Please utilize OTC nasal saline rinses or a netti pot once in the morning and once at night, to improve nasal congestion symptoms    Blow your nose, then use saline nasal spray to rinse bilaterally, blow your nose again, then use steroidal nasal spray (flonase).    Please continue to hydrate aggressively, and get plenty of rest.    You may use of over-the-counter Mucinex DM 600 mg-30 mg twice daily x 5 days for mucus/congestion/cough relief.    You may also purchase and utilize OTC Vicks vapor rub to apply to your chest as directed for cough relief.    Schedule a follow-up appointment with your ENT.

## 2017-11-03 NOTE — UC Provider Note (Signed)
History     Chief Complaint   Patient presents with    Facial Pain     Patient reports facial pressure for last three days. Patient reports was here yesterday for similar symptoms. Patient also reports nasal discharge, denies any fevers /chills.      Shari Harvey is a 42 y.o. Female, pleasant, answers significant for anxiety, migraine, and Ehlers Danlos, who presents today with generalized facial/sinus pressure, headache increases when looking down, and clear nasal discharge 3 days.  She was seen at urgent care 1 day ago for migraine which resolved with the use of Toradol and her at home Imitrex.  Denies current headache.  Patient has a history of chronic sinusitis, recently finished antibiotics for sinusitis one day ago.  Patient states that she is concerned she has a CSF leak, states she let that Web M.D. today and her symptoms correlate.  Denies fever, chills, nausea, vomiting, vision changes, salty taste in mouth, numbness, tingling or gait instability.  Denies recent trauma to her head.    Patient reports using saline nasal spray and Mucinex over-the-counter with mild relief of symptoms.          History provided by:  Patient  Language interpreter used: No        Medical/Surgical/Family History     Past Medical History:   Diagnosis Date    Anxiety     Asthma     managed by Dr. Nicholes Stairs    Complication of anesthesia     hypotension    Fatigue     GERD (gastroesophageal reflux disease)     Herniated lumbar disc without myelopathy     History of rectal abscess     Hyperlipidemia     not being treated    Hypermobility syndrome     Hypoglycemia     Insomnia     Irritable bowel syndrome     Migraines     Morbid obesity     Numbness and tingling     OSA on CPAP     Osteoarthritis     Rash     under hanging skin    Shortness of breath     Stress incontinence     Supraventricular arrhythmia         Patient Active Problem List   Diagnosis Code    BMI 36.0-36.9,adult Z68.36    Morbid obesity  E66.01    Asthma J45.909    GERD (gastroesophageal reflux disease) K21.9    Osteoarthritis M19.90    Sleep apnea G47.30    Hyperlipidemia E78.5    BMI 35.0-35.9,adult Z68.35    Bariatric surgery status Z98.84    BMI 31.0-31.9,adult Z68.31    BMI 31.0-31.9,adult Z68.31    BMI 31.0-31.9,adult Z68.31    BMI 28.0-28.9,adult Z68.28    Malabsorption K90.9    BMI 27.0-27.9,adult Z68.27            Past Surgical History:   Procedure Laterality Date    ANNAL FISSURE      & Fistula septic    CESAREAN SECTION, CLASSIC  4098,1191    CESAREAN SECTION, UNSPECIFIED      GASTRECTOMY      LUMBAR LAMINECTOMY      PERIANAL      REMOVAL OF VENOUS THROMBOSIS    PR LAP, GAST RESTRICT PROC, LONGITUDINAL GASTRECTOMY N/A 08/27/2016    Procedure: LAPAROSCOPIC GASTRECTOMY SLEEVE;  Surgeon: Darrell Jewel, MD;  Location: [facility];  Service: General    SPINE  SURGERY      TONSILLECTOMY AND ADENOIDECTOMY       Family History   Problem Relation Age of Onset    Cancer Mother         glioblastoma    Depression Mother     Early death Mother     Morbid Obesity Mother     Stroke Mother     Osteoarthritis Father     Asthma Father     Cancer Father     High cholesterol Father     Obesity Father     Asthma Sister     Cancer Sister     Osteoarthritis Sister     Osteoarthritis Sister     Migraines Sister     Migraines Sister     Thyroid disease Sister     Thyroid disease Sister     Asthma Sister           Social History   Substance Use Topics    Smoking status: Former Smoker     Quit date: 06/05/2003    Smokeless tobacco: Never Used    Alcohol use No     Living Situation     Questions Responses    Patient lives with     Homeless     Caregiver for other family member     External Services     Employment     Domestic Violence Risk                 Review of Systems   Review of Systems   Constitutional: Negative for chills, fatigue and fever.   HENT: Positive for congestion, rhinorrhea, sinus pain and sinus  pressure. Negative for drooling, ear discharge, ear pain, sneezing, sore throat and trouble swallowing.    Eyes: Negative for photophobia, discharge, itching and visual disturbance.   Respiratory: Negative for cough, choking and shortness of breath.    Cardiovascular: Negative for chest pain.   Gastrointestinal: Negative for abdominal pain, diarrhea, nausea and vomiting.   Genitourinary: Negative for difficulty urinating.   Musculoskeletal: Negative for arthralgias, gait problem and myalgias.   Skin: Negative for rash.   Neurological: Negative for dizziness, weakness, numbness and headaches.   Hematological: Negative for adenopathy.   Psychiatric/Behavioral: Negative for confusion.       Physical Exam   Triage Vitals  Triage Start: Start, (11/03/17 2051)   First Recorded BP: 138/83, Resp: 16, Temp: 36.6 C (97.9 F), Temp src: TEMPORAL Oxygen Therapy SpO2: 100 %, Oximetry Source: Rt Hand, O2 Device: None (Room air), Heart Rate: 85, (11/03/17 2054)  .  First Pain Reported  0-10 Scale: 5, Pain Location/Orientation: Face, (11/03/17 2054)       Physical Exam   Constitutional: She is oriented to person, place, and time. She appears well-developed and well-nourished. No distress.   HENT:   Head: Normocephalic and atraumatic.   Right Ear: Tympanic membrane, external ear and ear canal normal. Tympanic membrane is not injected and not erythematous.   Left Ear: Tympanic membrane, external ear and ear canal normal. Tympanic membrane is not injected and not erythematous.   Nose: Mucosal edema and rhinorrhea present.   Mouth/Throat: Uvula is midline and mucous membranes are normal. No oral lesions. No trismus in the jaw. No uvula swelling. Posterior oropharyngeal erythema present. No posterior oropharyngeal edema. Tonsils are 1+ on the right. Tonsils are 1+ on the left. No tonsillar exudate.   Bilateral frontal maxillary sinuses with increased pressure to palpation   Eyes:  Pupils are equal, round, and reactive to light.  Conjunctivae and EOM are normal. Right eye exhibits no discharge. Left eye exhibits no discharge.   Bilateral EOMS intact, neg nystagmus, negative gaze preference. Visual fields intact to confrontation     Neck: Normal range of motion. Neck supple.   Cardiovascular: Normal rate, regular rhythm, normal heart sounds and intact distal pulses.    Pulmonary/Chest: Effort normal and breath sounds normal.   Musculoskeletal: Normal range of motion.   Lymphadenopathy:     She has no cervical adenopathy.   Neurological: She is alert and oriented to person, place, and time. She has normal strength. No cranial nerve deficit or sensory deficit. She displays a negative Romberg sign. GCS eye subscore is 4. GCS verbal subscore is 5. GCS motor subscore is 6.   Facial expression is symmetrical, facial sensation is symmetrical and sensitive to light touch.  Hearing intact to voice, palate elevates symmetrically, shoulder shrug is symmetrical, tongue is midline.      Skin: Skin is warm. Capillary refill takes 2 to 3 seconds. She is not diaphoretic.   Psychiatric: She has a normal mood and affect. Her behavior is normal. Judgment and thought content normal.   Nursing note and vitals reviewed.       Medical Decision Making        Initial Evaluation:  ED First Provider Contact     Date/Time Event User Comments    11/03/17 2052 ED First Provider Contact Tejuan Gholson I. Initial Face to Face Provider Contact          Patient was seen on: 11/03/2017        Assessment:  42 y.o.female comes to the [facility] with 3 days of a term and headache, facial/sinus pressure, nasal congestion and rhinorrhea.  Patient reports that she is concern for CSF leak, denies recent trauma to her head.  She states that she recently looked on "web MD"and is convinced that her symptoms correlate with CSF leak.  Patient is afebrile, VSS, no acute distress.  Negative neurological findings on exam.  Mild nasal congestion noted, negative sinus tenderness.  The  rest of the exam is unremarkable.      Differential Diagnosis includes:  Bacterial sinusitis  Viral Sinusitis  Allergic Rhinitis  Nasopharyngitis  Rhinosinusitis  Acute URI  Bronchitis   Tension HA  migraine      Plan:   Orders Placed This Encounter   No orders placed during this encounter.     Recent Results (from the past 24 hour(s))   Immunofixation, fluid    Collection Time: 11/04/17  9:00 PM   Result Value Ref Range    Fluid Type Other- specify    -nasal discharge with negative halo test with the use of wood's lamp  -Discussed with patient the likelihood that her symptoms are diffuse to CSF leak.  It is not high on my current differential.  Discussed with patient that if she felt concerned that this is what was occurring she could go to the ED to have herself evaluated.  Discussed with patient to schedule an appointment with her ENT for tomorrow.  Patient is agreeable to plan.  Final Diagnosis    ICD-10-CM ICD-9-CM   1. Sinusitis, unspecified chronicity, unspecified location J32.9 473.9     Patient instructions:     Please purchase and utilize over-the-counter Claritin or Zyrtec once daily.    You may use warm compresses (i.e. Washcloth warmed up with hot water, 10 minutes  at a time) on bilateral sinuses 4-5 times daily for symptom relief.    Utilizing steam/humidified air from hot showers/steam baths may also help with sinus congestion symptoms.    Please utilize OTC nasal saline rinses or a netti pot once in the morning and once at night, to improve nasal congestion symptoms    Blow your nose, then use saline nasal spray to rinse bilaterally, blow your nose again, then use steroidal nasal spray (flonase).    Please continue to hydrate aggressively, and get plenty of rest.    You may use of over-the-counter Mucinex DM 600 mg-30 mg twice daily x 5 days for mucus/congestion/cough relief.    You may also purchase and utilize OTC Vicks vapor rub to apply to your chest as directed for cough relief.    Schedule a  follow-up appointment with your ENT.        Please start the new medications as below:  Current Discharge Medication List        Please follow up with your physician as below:  Follow-up Information     Demetra Shiner, MD. Schedule an appointment as soon as possible for a visit   in 3 days.    Specialty:  Internal Medicine  Why:  As needed, If symptoms worsen, if symptoms do not improve  Contact information:  [address] [place] [address]  [phone]               Patient comfortable with plan for discharge.  All questions and concerns were addressed.           Final Diagnosis  Final diagnoses:   [J32.9] Sinusitis, unspecified chronicity, unspecified location (Primary)         Evangeline Dakin, NP              Annett Gula, NP  11/05/17 1501

## 2017-11-04 ENCOUNTER — Other Ambulatory Visit
Admission: RE | Admit: 2017-11-04 | Discharge: 2017-11-04 | Disposition: A | Discharge status: Home / Self Care | Payer: PRIVATE HEALTH INSURANCE | Source: Ambulatory Visit | Attending: Otolaryngology | Admitting: Otolaryngology

## 2017-11-04 ENCOUNTER — Telehealth: Payer: Self-pay | Admitting: Otolaryngology

## 2017-11-04 ENCOUNTER — Encounter: Payer: Self-pay | Admitting: Otolaryngology

## 2017-11-04 ENCOUNTER — Ambulatory Visit: Payer: PRIVATE HEALTH INSURANCE | Attending: Otolaryngology | Admitting: Otolaryngology

## 2017-11-04 VITALS — BP 123/79 | HR 72 | Ht 67.0 in | Wt 182.0 lb

## 2017-11-04 DIAGNOSIS — J329 Chronic sinusitis, unspecified: Secondary | ICD-10-CM

## 2017-11-04 DIAGNOSIS — J3489 Other specified disorders of nose and nasal sinuses: Secondary | ICD-10-CM | POA: Insufficient documentation

## 2017-11-04 NOTE — Telephone Encounter (Signed)
May offer first AM UC appt today

## 2017-11-04 NOTE — Telephone Encounter (Signed)
Patient is calling to see if she can be seen today intstead of next week. She already has an appt with Dr man on 6/10. She just got done with a round of antibiotics for a sinus infection but now she has lots of pressure under her cheek bones

## 2017-11-04 NOTE — Progress Notes (Signed)
Chief Complaint:   Concern for CSF leak  HPI:   Shari Harvey is a 42 y.o. female who presents to urgent care clinic today for concern for CSF leak. She states she is having migraine pain that began Thursday. Patient with a history of EDS Migraine and receives Botox injections be her neurologist. She states that prior to the migraine she had began experiencing clear drainage when she is bending over and facial pressure. When she is laying down and sleeping symptoms are not present then when she wakes up they worsen throughout the day. She denies blurred or double vision, she has photophobia and nausea. She has stopped irrigating. She reports that the fluid drainage is not worse when she stands up from sleeping at night. She denies slaty or metallic taste in her mouth. Patient receives allergy immunotherapy TID and takes Singular ,Q-Uvar 2 puffs a day and PRN Benadryl and Albuterol and repots 4-5 sinus infections since January. She is asking if I can order her films today since she sees Dr. Loreta Ave in a week for follow up chronic sinus disease. She recently completed Cefdinir Friday for her most recent sinus infection. She denies fever, chills, purulent nasal drainage and is otherwise feeling pretty good before clear drainage and migraine presented. She repots in the past 2 years since her last CT scan she has experienced more frequent sinus infections.  She was seen yesterday in urgent care and had a negative halo past with Wood's lamp and referred to ENT.      CT: 2016:   IMPRESSION:       Mild mucosal thickening in the maxillary and ethmoid sinuses.    Minimal mucosal thickening in the frontal and sphenoid sinuses.  Small left Haller cell.        Review of Systems:   12 point review of systems performed.  All pertinent positives and negatives as per HPI.    Patient's medications, allergies, past medical, surgical, social and family histories were reviewed and updated as appropriate.    Medical History Past Medical  History:   Diagnosis Date    Anxiety     Hyperlipidemia     Hypertension       Surgical History Past Surgical History:   Procedure Laterality Date    CHOLECYSTECTOMY        Family History Family History   Problem Relation Age of Onset    Cancer Mother     Heart Disease Father         MI    Arthritis Sister         rheumatoid arthritis    Diabetes Maternal Grandmother     Heart Disease Brother         Current Medications:  Current Outpatient Prescriptions   Medication    neomycin-polymyxin-hydrocortisone (CORTISPORIN) otic solution    atorvastatin (LIPITOR) 10 MG tablet    sertraline (ZOLOFT) 25 MG tablet    hydrochlorothiazide (HYDRODIURIL) 25 MG tablet    clobetasol (TEMOVATE) 0.05 % cream    Cholecalciferol (VITAMIN D) 1000 UNITS capsule    diazepam (VALIUM) 2 MG tablet    valACYclovir (VALTREX) 1 GM tablet     No current facility-administered medications for this visit.        Allergies:   Codeine; Decongest-aid [pseudoephedrine]; and Novocain [procaine]    Objective:   BP 148/83    Pulse 81    Ht 1.524 m (5')    Wt 61.2 kg (135 lb)    BMI 26.37  kg/m   General:   healthy, alert, appears stated age, no barriers to communication.   Head and Face:   no craniofacial deformities.   Ears:  LEFT: Pre and post auricular regions benign. External ear normal, without lesions or rash. Ear canal patent.  Tympanic membranes, translucent,  intact and mobile with normal light reflexes and landmarks visible.    RIGHT: Pre and post auricular regions benign. External ear normal, without lesions or rash. Ear canal patent.  Tympanic membranes, translucent,  intact and mobile with normal light reflexes and landmarks visible.   Nose:    Nasal membranes moderately congested.  Septum midline without significant deviation.  There is no presence of purulence or drainage, clear rhinorrhea appreciated.  No evidence of nasal mass or polyps.   Oral:   Lips without lesion. Buccal mucosa pink and moist. Tongue of normal size.   Dentition in adequate restoration. Uvula midline.  Oropharynx patent with nonobstructing tonsils.  Posterior pharyngeal wall without drainage or erythema.    Larynx:  Not Visualized   Neck:   No palpable cervical adenopathy.  Thyroid palpable and without mass or enlargement.  Normal crepitance of larynx.      Assessment   Shari Harvey is a 42 y.o. female with history of . EDS, chronic sinusitis, and presenting today with clear rhinorrhea and concern for CSF leak. Exam findings reviewed with patient.   I have instructions on how to perform a home test to rule out CSF leak.  Given she is free of ABRS today nasal endoscopy was not performed, however I did order a CT scan to evaluate treatment response this can be reviewed at her follow-up appointment next week with Dr. Man. She will return for follow-up in  1 week with Dr. Man to review CT scan, I will call patient with lab results.     Plan:   1. EDS- with concern for CSF leak, Beta- 2 transferrin test ordered will call patient with results  2. Chronic sinusitis- most recent treatment with good response, recommend saline irrigation, follow-up CT scan was ordered to evaluate need for ongoing treatment will review with Dr. Man at her follow-up appointment 6/10  3. Follow-up as scheduled  Suanne MarkerSchuyler Avo Schlachter, NP as of 9:54 AM, 07/01/2017

## 2017-11-04 NOTE — Telephone Encounter (Signed)
I spoke with patient and scheduled at 10:30 with Charolett BumpersSchuyler, PA

## 2017-11-06 ENCOUNTER — Telehealth: Payer: Self-pay | Admitting: Otolaryngology

## 2017-11-06 ENCOUNTER — Encounter: Payer: Self-pay | Admitting: Otolaryngology

## 2017-11-06 LAB — IMMUNOFIXATION,FLUID: Immunofixation,Fl: NEGATIVE

## 2017-11-06 LAB — REVIEW PERFORMED,FIF

## 2017-11-06 NOTE — Communication Body (Signed)
Booked CT Sinus w/o contrast at [facility] 6.6.19  8:30AM (No Prep)  F/U  Dr. Man  6.10.19  4:00PM  [facility] to obtain auth  Confirmed VM  6.5.19  9:43AM  [place]

## 2017-11-06 NOTE — Telephone Encounter (Signed)
I left a detailed message on patients identified voicemail and asked she call back with questions/concerns.

## 2017-11-06 NOTE — Telephone Encounter (Signed)
-----   Message from Suanne MarkerSchuyler Francis, NP sent at 11/06/2017 12:35 PM EDT -----  Please call patient and advise her test was negative for CSF leak. She will see Dr. Man as scheduled on 6/10.  Suanne MarkerSchuyler Francis, NP    ----- Message -----  From: [id number]  Sent: 11/05/2017  11:00 AM  To: Suanne MarkerSchuyler Francis, NP

## 2017-11-07 ENCOUNTER — Ambulatory Visit
Admission: RE | Admit: 2017-11-07 | Discharge: 2017-11-07 | Disposition: A | Discharge status: Home / Self Care | Payer: PRIVATE HEALTH INSURANCE | Source: Ambulatory Visit

## 2017-11-07 DIAGNOSIS — J329 Chronic sinusitis, unspecified: Secondary | ICD-10-CM

## 2017-11-07 DIAGNOSIS — J3489 Other specified disorders of nose and nasal sinuses: Secondary | ICD-10-CM

## 2017-11-11 ENCOUNTER — Ambulatory Visit: Payer: PRIVATE HEALTH INSURANCE | Attending: Plastic Surgery | Admitting: Otolaryngology

## 2017-11-11 ENCOUNTER — Other Ambulatory Visit: Payer: Self-pay

## 2017-11-11 ENCOUNTER — Encounter: Payer: Self-pay | Admitting: Otolaryngology

## 2017-11-11 VITALS — BP 121/79 | HR 83 | Ht 67.0 in | Wt 177.0 lb

## 2017-11-11 DIAGNOSIS — J0191 Acute recurrent sinusitis, unspecified: Secondary | ICD-10-CM

## 2017-11-11 DIAGNOSIS — J309 Allergic rhinitis, unspecified: Secondary | ICD-10-CM

## 2017-11-11 DIAGNOSIS — G43909 Migraine, unspecified, not intractable, without status migrainosus: Secondary | ICD-10-CM

## 2017-11-11 DIAGNOSIS — R448 Other symptoms and signs involving general sensations and perceptions: Secondary | ICD-10-CM

## 2017-11-11 DIAGNOSIS — J343 Hypertrophy of nasal turbinates: Secondary | ICD-10-CM

## 2017-11-11 DIAGNOSIS — R6889 Other general symptoms and signs: Secondary | ICD-10-CM

## 2017-11-11 NOTE — Progress Notes (Signed)
Reason for Visit  Recurrent sinus infections.    HPI  Shari Harvey is a 42 y.o. female who presents for recurrent episodes of sinusitis. Her sinusitis episodes consist of night sweats, body aches, head pain, worsened asthma that she feels is related to thick post nasal drip, worsened sense of smell and taste. She does feel her allergies may the episodes worse. She has had 5-6 episodes in the last year. They episodes last 2 weeks and she is often treated with antibiotics. She feels if she is not treated with antibiotics, her asthma worsens for a few weeks following the infection. Her allergy episodes are characterized by itchy eyes and nose, nasal congestion and clear nasal drainage.Today she is having sinus pressure between her eyes and in her left cheek, post nasal drip (yellow in color in the morning and clear later in the day). She denies decreased sense of smell and nasal congestion today. She was recently seen 11/06/2017 for clear drainage form the nose. A beta transferrin test was obtained and was negative. A CT scan was ordered which showed turbinate hypertrophy bilaterally, mucosal thickening in the left > right maxillary sinus and some mild mucosal thickening in the ethmoid sinuses. No signs of skull base defects. She is no longer having clear rhinorrhea from the front of her nose when she bends over. She has no salty or metallic taste in her mouth. No fevers, chills or sweats. She finished a 2 week course of Omnicef 2 and a half weeks ago. Today, she complains of 2 weeks of left cheek pressure. Her neurologist prescribed prednisone late last week, but she did not start it because the pharmacy was out of the medication. She currently takes Clarinex, Zyrtec, montelukast, Qvar (orally inhaled), albuterol prn. She has not tolerated multiple intranasal steroid sprays due to nose bleeds. She does not do nasal saline irrigations due to time restraints.She recently saw Shari Harvey with Neurology 1 week ago and was  prescribed steroids for migraine. She feels her migraines are worse when she is having congestion, facial pressure and drainage. When she takes Imitrex, her headache resolves but the maxillary pressure does not go away.  She reports having a septoplasty and turbinate reduction around the age of 42 and that his helped her be "healthy" for quite some time. She has had her adenoids removed multiple times.    To review, she was first seen in  March 2016, she reported decreased energy and 60 pound weight gain over the past year. She reported being on 5 antibiotics as well as oral steroids between August 2015 and March 2016. She was on Augmentin in August, October, and December 2015. In early 2016, she was on Avelox x 7 days (did nothing) and then Ceftin and Biaxin in conjunction with 3 weeks of Medrol. She had allergy testing with Shari Harvey and was found to have sensitivities to dust mites, tree pollens, and rats ("very allergic ... had them as pets"). She had been previously allergic to dog and rabbit dander and had allergy immunotherapy as a child. She was treated with doxycylcine 03/25/15, Augmentin July 2018 for sinusitis and has had 4-5 episodes of sinusitis every year since 2014. She has been on immunotherapy every other week with Shari Harvey since 2016.    The Sino-Nasal Outcome Test (SNOT)-22 score on 11/11/17 was 41 out 110, with higher scores indicating more severe symptoms.    Nasal Symptom Inventory  Facial or sinus pressure:  Moderate   Facial or sinus pain:  Mild  Headache:  Moderate   Nasal congestion:  Mild   Nasal obstruction:  None   Post-nasal drip:  Mild   Clear nasal discharge:   Mild   Discolored nasal discharge:  Mild   Itchy nose/eyes/throat:  Severe   Nose bleeds: Mild   Tiredness:  Moderate   Wheezing:  Mild   Cough:  Mild     Allergies  She is allergic to morphine; adhesive tape; avelox [moxifloxacin]; environmental allergies; flonase [fluticasone]; shellfish-derived products; and adhesive  tape-silicones [other].    PMH  She has a past medical history of Anxiety; Asthma; Complication of anesthesia; Fatigue; GERD (gastroesophageal reflux disease); Herniated lumbar disc without myelopathy; History of rectal abscess; Hyperlipidemia; Hypermobility syndrome; Hypoglycemia; Insomnia; Irritable bowel syndrome; Migraines; Morbid obesity; Numbness and tingling; OSA on CPAP; Osteoarthritis; Rash; Shortness of breath; Stress incontinence; and Supraventricular arrhythmia.    PSH  She has a past surgical history that includes Tonsillectomy and adenoidectomy; Cesarean Section, Unspecified; lumbar laminectomy; PERIANAL; ANNAL FISSURE; Spine surgery; Cesarean section, classic (8295,6213); pr lap, gast restrict proc, longitudinal gastrectomy (N/A, 08/27/2016); and gastrectomy. She had adenoidectomy in approximately 1982, 1992, and 1998. She had turbinate reduction and septoplasty in approximately 1998.    FH  Her family history includes Asthma in her father, sister, and sister; Cancer in her father, mother, and sister; Depression in her mother; Early death in her mother; High cholesterol in her father; Migraines in her sister and sister; Morbid Obesity in her mother; Obesity in her father; Osteoarthritis in her father, sister, and sister; Stroke in her mother; Thyroid disease in her sister and sister.    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 She  reports that she quit smoking about 14 years ago. She has never used smokeless tobacco. She reports that she currently engages in sexual activity and has had female partners. She reports that she does not drink alcohol or use drugs.    ROS  Positive for items listed in HPI/NSI. Other organ systems negative. Reviewed and scanned into electronic medical record.    PE  Vitals BP 121/79    Pulse 83    Ht 1.702 m (5\' 7" )    Wt 80.3 kg (177 lb)    BMI 27.72 kg/m    General General appearance normal. Alert and cooperative. Voice normal.    Head/Face Normocephalic; atraumatic. No facial skin lesions. Facial strength symmetric.   Eyes Extraocular muscles intact. No nystagmus with lateral gaze.   Ears, Nose, Mouth & Throat Clinical speech thresholds normal. External ears normal. Septum is midline. Mucosa is edematous and erythematous. This responded to afrin There is clear rhinorrhea bilaterally   Neck No masses. Normal appearance for age.    Respiratory Normal respiratory effort. No retractions.   Cardiovascular Peripheral vascular system normal.   Lymphatic No palpable cervical adenopathy.   Neuro/Psych CNII-XII grossly normal. Alert and oriented. Mood and affect normal.     Results   CT sinus (08/18/2014, images reviewed with patient on 08/18/2014) - Mild mucosal thickening in the maxillary and ethmoid sinuses. Minimal mucosal thickening in the frontal and sphenoid sinuses. Small left Haller cell.    Culture (03/07/15), corynebacterium species resistant to cefazolin, erythromycin, methicillin, oxacillin, penicillin-G   CT sinus (11/08/17) images reviewed with patient 11/11/17- mild mucosal thickening of right maxillary sinus, narrowing the maxillary os on that side, moderate mucosal thickening in the left maxillary sinus, minimal mucosal thickening of the bilateral ethmoid sinuses.  Assessment  1. Left facial pressure.  2. Recurrent acute rhinosinusitis.  3. Allergic rhinitis.  4. Hypertrophy of inferior turbinates.  5. Migraine.  6. History of septoplasty, inferior turbinate reduction.    Plan   Nasal endoscopy was performed to assess for drainage from the paranasal sinuses. There was edema of the nasal mucosa near the left OMU. There was bilateral turbinate hypertrophy. No purulence or drainage today. She does not have signs of active bacterial infection at this time.   We reviewed her CT findings with her today.     We discussed the fact that sinus surgery would not address her allergy symptoms and it may improve her frequency of bacterial  sinusitis.   She should continue to optimize her allergies with Dr. Berlin Hun. She may benefit from retesting of her allergies given that she has been on immunotherapy for 3 years.   We discussed the risks and benefits of sinus surgery. She would be most likely to benefit from bilateral maxillary antrostomy, anterior ethmoidectomy, turbinate reduction, possible balloon dilation of the frontal sinuses as an outpatient procedure. Given the summer is a better time of year for her allergies and infections, planning for surgery during that time would be ideal. We discussed the extent of benefit is less certain given the relative lack of disease on the CT imaging. The goal of surgery, should she want to pursue it would be to reduce the frequency of sinusitis episodes requiring antibiotics. We would not expect surgery to improve her allergy symptoms, despite her feeling that in the past allergies improved with septoplasty and turbinate reduction. We discussed that sometimes migraine/headache symptoms may transiently and temporarily improve for 6 to 12 months after sinus surgery, but sinus surgery is not a treatment for headache. Given the co-existing allergy and headache symptoms, the amount of symptomatic benefit from endoscopic sinus surgery for recurrent acute rhinosinusitis is less certain.    She would like to consider the options at this point and will notify us if she decides to move forward with surgery.    Aleatha Borer, MD  Otolaryngology Head and Neck Surgery     Attestation  I saw and evaluated the patient with Shari Harvey. Shari Running, MD (PGY-[id number]). I have reviewed and edited the resident's note and confirm the findings and plan of care as documented above. During the above endoscopic procedure, I was present for the entire viewing portion of the procedure including insertion to removal of the scope.

## 2017-11-11 NOTE — Procedures (Signed)
PROCEDURE  Bilateral nasal endoscopy.    INDICATIONS  Recurrent acute sinusitis    DESCRIPTION  Informed consent was obtained. The time-out was performed. Topical tetracaine and oxymetazoline were applied. Bilateral nasal endoscopy was performed with a 3 mm 30 degree rigid endoscope. The patient tolerated the procedure well without apparent complications.    Left - turbinate hypertrophy and edema present. Edema near the left OMU. No polyps, masses or lesions. No purulent drainage    Right -  turbinate hypertrophy and edema present. No polyps, masses or lesions. No purulent drainage    Aleatha BorerJames Sheli Dorin, MD  Otolaryngology Head and Neck Surgery

## 2017-11-12 DIAGNOSIS — J0191 Acute recurrent sinusitis, unspecified: Secondary | ICD-10-CM | POA: Insufficient documentation

## 2017-11-13 ENCOUNTER — Other Ambulatory Visit: Payer: PRIVATE HEALTH INSURANCE

## 2017-11-18 ENCOUNTER — Encounter: Payer: Self-pay | Admitting: Otolaryngology

## 2017-11-18 ENCOUNTER — Other Ambulatory Visit: Payer: Self-pay

## 2017-11-18 ENCOUNTER — Ambulatory Visit: Payer: PRIVATE HEALTH INSURANCE | Attending: Otolaryngology | Admitting: Otolaryngology

## 2017-11-18 VITALS — BP 113/67 | HR 67 | Ht 67.0 in | Wt 177.0 lb

## 2017-11-18 DIAGNOSIS — J0121 Acute recurrent ethmoidal sinusitis: Secondary | ICD-10-CM

## 2017-11-18 DIAGNOSIS — J0101 Acute recurrent maxillary sinusitis: Secondary | ICD-10-CM

## 2017-11-18 DIAGNOSIS — G43909 Migraine, unspecified, not intractable, without status migrainosus: Secondary | ICD-10-CM

## 2017-11-18 DIAGNOSIS — J309 Allergic rhinitis, unspecified: Secondary | ICD-10-CM

## 2017-11-18 DIAGNOSIS — J343 Hypertrophy of nasal turbinates: Secondary | ICD-10-CM

## 2017-11-18 NOTE — Patient Instructions (Signed)
[facility]   [address]  [place], [place] [address]  [phone]    Pre-operative Guidelines    Day of surgery arrival time:  The surgery center will call you the business day before your procedure between 3 and 4:30pm.     Eating and drinking guidelines:  No solid foods after midnight the night before your procedure.  Adults-  May have clear liquids (water, soda or apple juice only) up to 4 hours prior to your arrival time and then nothing by mouth.  Pediatrics-  All but dental patients may have clear liquids (water, soda, or apple juice only) up to 3 hours before arrival time.   Breast fed infants may have breast milk up to 4 hours before arrival.  Formula fed infants may have formula up to 6 hours before arrival.    Medications:  Do not take any ibuprofen (Motrin, Advil), naproxen (Aleve), aspirin, or fish oil for 2 weeks prior to surgery, unless instructed otherwise.  The nurse from the surgery center will contact you regarding medications as applicable.     Items to bring on the day of surgery:  Insurance card, photo ID and healthcare proxy.  If you use a CPAP for sleep apnea, please bring your mask with you.  Your crutches, knee brace or arm sling. Please leave your crutches in the car.    Clothing, Jewelry, valuables and eyeglass case:  Leave all jewelry and valuables at home.  Bring your eyeglasses and eyeglass case. You cannot wear contact lenses.     Pediatrics:  A parent or legal guardian must accompany and remain in the building at all times. We recommend that two adults accompany the patient home while riding in a car; one to drive the vehicle and one to assist with the care of the child.   Bring the child’s favorite comfort item (e.g., blanket, doll or toy).    Surgeon Instructions:  Please read all pre-operative instructions carefully, and follow your surgeon's guidelines if different from these instructions.      Discharge Instructions for Endoscopic Sinus Surgery - Dr. Man    The following  guidelines are meant to assist you in the immediate post operative period following surgery of the nose and sinuses, including endoscopic sinus surgery.    Activity: Although responses vary, most often sinus surgery is usually very well tolerated, depending on the type of anesthesia. Rest at home for one day and then gradually increase your activity. Do not bend, lift or strain for at least one week after surgery. Avoid strenuous activity for approximately two weeks.    Diet: Gradually resume a normal diet, beginning with clear liquids and then advancing to a soft diet. Nausea may occur following anesthesia and usually clears after a few hours. If nausea and vomiting persist, notify the office.     Nasal Discharge: This usually occurs for 1-2 days following surgery, and may be bloody at first, changing to a blood tinged mucus. Any bright red bleeding which persists should be reported. Change your nasal drip pad as needed. Please avoid blowing your nose until after the first postoperative visit with your doctor.    Nasal Congestion: A stuffy nose is normal following sinus surgery, due to the swelling of the tissues. This can be alleviated by humidification of the nose with a cool mist vaporizer/humidifier. Irrigate your nose 4-6 times daily with saline using the Neil Med Sinus Rinse squeeze bottle while healing. Use one whole bottle of saline, half   in each nostril, each time.    Pain: Pain is infrequent following sinus surgery. Pain medication may be prescribed or you may use Tylenol. Please take the antibiotic or other medication as directed to reduce inflammation and promote healing. Please do not take aspirin, ibuprofen, naproxen, or other non-steroidal anti-inflammatory drugs (NSAIDs).    Additional Instructions: Please notify the office if any of the following occurs:   1. Visual problems consisting of loss of vision, double vision, or bulging of one or both eyes.  2. Fever persisting above 101.5.  3. Neck  stiffness with fever and/or severe headache.  4. If bleeding is bright red or if you are soaking the drip pad with blood within 15 minutes of changing it.  5. Brisk bleeding from nose which is uncontrollable.      Any additional questions or concerns may be directed to a medical professional at our office at [phone].        Things that you will need before surgery:      -NeilMed SInus Rinse Bottle    -NeilMed Sinus Rinse Packets     -Distilled Water                  How to Prevent Surgical Site Infections    About this topic   A surgical wound is a cut in the skin from a procedure. Doctors are either taking something out of the body or treating a disease inside the body. Any cut made on the skin gives germs easy access. This may result in infection. This infection can start at any time from 2 to 3 days to 2 to 3 weeks after the surgery. The infection may spread deeper in the body if it is not treated. You will start to feel unwell and serious health problems may happen. An infection may also cause the cut to open up again.  General   These things can raise your risk of getting infection after surgery:  · Poor nutritional status  · If you are a diabetic and you do not take your drugs regularly  · Smoking or use of tobacco products  · Being overweight  · If you have other infected wounds  · Weak immune system like in HIV, chemotherapy, and transplant patients  · Long-term use of steroids  · Long stay in the hospital  · Women using contraceptive pills  You can help to lower your chance of getting a surgical site infection. Here are some things you can do.  Before Your Surgery   · Tell your doctor if you already have other infections. Even something like a cold or sore throat may raise your risk of getting a surgical site infection.  · Wash your hair and take a bath or shower before your procedure.  · Talk to your doctor about all of the infections you have had in the past. Your doctor may order drugs for you  before, during, and after your surgery.  · If you need to remove hair from the surgical site, clip the hair with scissors. Do not shave it.  · If your doctor gives you antibiotics before the procedure, take them as ordered. Do not miss any doses.  After Your Surgery   · Wash your hands before you touch your wound or dressing. If you have to change the dressing, wash your hands before and after. Make sure any person who touches the dressing washes their hands first.  · Tell your doctor right away if   you see signs of infection or if you feel ill.  · Talk to your doctor and learn how to care for your cut site. Ask your doctor about:  ¨ When you should change your bandages  ¨ When you may take a bath or shower  · If your doctor prescribes drugs for infection, you need to take the drug as directed until the drug is gone.  · When changing your dressing, do it in a clean room. Store your bandages in a clean bag and cabinet. Throw away the dirty dressing right away and wash your hands.       What will the results be?   Taking good care of your surgical site will help avoid infection and your wound will heal faster.  What lifestyle changes are needed?   · Keep good hygiene. Cleanliness is very important for proper wound healing.  · Stop or lessen smoking.  · Get lots of rest. Sleep when you are feeling tired. Avoid doing tiring activities.  · Eat a healthy diet. Ask your doctor what kind of diet you should be on.  What drugs may be needed?   The doctor may order drugs to:  · Help with pain  · Prevent or fight an infection  Will physical activity be limited?   · Movement may be limited in most used body parts like the hands, elbows, and knees. Limiting the movement may help with faster wound healing.  · Do not go swimming, take tub baths, or do other activities that may soak your wound. Dirty water can get in the wound and may cause infection.  · Avoid stretching and pulling activities that could cause your scar to pull  apart. Call your doctor if this happens.  · Your doctor may ask you to avoid lifting, straining, exercise, or sports for the first month after surgery. Talk with your doctor about the right amount of activity for you.  Will there be any other care needed?   · Some wounds need tubes to drain the fluids coming out of the wound. Talk with your doctor about how to care for your drain if you have one.  · Some stitches melt away in 1 to 3 weeks. You need to clean the cut line with care. Your doctor will give you orders on how to clean them.  · If you have stitches or staples, you will need to have them taken out. Your doctor will often want to do this in 1 to 2 weeks.  What problems could happen?   · Infection  · Scarring  · Wound opens up  When do I need to call the doctor?   · Signs of infection. These include a fever of 100.4°F (38°C) or higher, chills, wound that will not heal.  · Signs of wound infection. These include swelling, redness, warmth around the wound; too much pain when touched; yellowish, greenish, or bloody discharge; foul smell coming from the cut site; cut site opens up.  · You are not feeling better in 2 to 3 days or you are feeling worse  Helpful tips   · Wear loose clothing. Good blood supply is important for healing.  · Do not remove your bandages unless your doctor says so.  · If your wound all of a sudden bleeds, applying some pressure may help stop the bleeding. See your doctor right away.  Teach Back: Helping You Understand   The Teach Back Method helps you understand the information we are   giving you. The idea is simple. After talking with the staff, tell them in your own words what you were just told. This helps to make sure the staff has covered each thing clearly. It also helps to explain things that may have been a bit confusing. Before going home, make sure you are able to do these:  · I can tell you about my procedure.  · I can tell you what I can do to help prevent infection before and  after my surgery.  · I can tell you what I will do if I have a fever, or swelling, redness, or warmth around my wound.  Where can I learn more?   Centers for Disease Control and Prevention  http://www.cdc.gov/Features/SafeSurgery/   http://www.cdc.gov/HAI/ssi/faq_ssi.html   Last Reviewed Date   2012-12-17  Consumer Information Use and Disclaimer   This information is not specific medical advice and does not replace information you receive from your health care provider. This is only a brief summary of general information. It does NOT include all information about conditions, illnesses, injuries, tests, procedures, treatments, therapies, discharge instructions or life-style choices that may apply to you. You must talk with your health care provider for complete information about your health and treatment options. This information should not be used to decide whether or not to accept your health care provider’s advice, instructions or recommendations. Only your health care provider has the knowledge and training to provide advice that is right for you.  Copyright   Copyright © 2015 [employer] and its affiliates and/or licensors. All rights reserved.

## 2017-11-18 NOTE — Progress Notes (Signed)
Pre-op and Post-op Patient Education    RE: Shari Harvey  DOB: 05/30/1976  MRN: [id number]     Date of Surgery: 12/17/17    Diagnosis: No diagnosis found.    Procedure: Endoscopic Sinus Surgery    Pre-op appt date: 11/18/2017    Provider: Dr. Hampton AbbotLi-Xing Man    written pre and postoperative instructions given  verbal pre and post operative instructions given  after surgery, patient may return to work in  1 weeks  special diet  pain management  no barriers to learning identified  demonstrates good understanding  postoperative appointment for 1&2 weeks        [name], RN

## 2017-11-19 DIAGNOSIS — J343 Hypertrophy of nasal turbinates: Secondary | ICD-10-CM | POA: Insufficient documentation

## 2017-11-19 DIAGNOSIS — J309 Allergic rhinitis, unspecified: Secondary | ICD-10-CM | POA: Insufficient documentation

## 2017-11-19 DIAGNOSIS — J0121 Acute recurrent ethmoidal sinusitis: Secondary | ICD-10-CM | POA: Insufficient documentation

## 2017-11-19 DIAGNOSIS — G43909 Migraine, unspecified, not intractable, without status migrainosus: Secondary | ICD-10-CM | POA: Insufficient documentation

## 2017-11-19 DIAGNOSIS — J0101 Acute recurrent maxillary sinusitis: Secondary | ICD-10-CM | POA: Insufficient documentation

## 2017-11-19 NOTE — Progress Notes (Signed)
Reason for Visit  Recurrent sinus infections. Preoperative visit.    HPI  Shari Harvey is a 42 y.o. female who presents for a preoperative visit. She was seen 1 week ago, during which treatment options were discussed. She is here today because she would like to proceed with bilateral maxillary antrostomy, anterior ethmoidectomy, revision inferior turbinate reduction, and balloon dilation of the bilateral frontal outflow tracts. The Sino-Nasal Outcome Test (SNOT)-22 score on 11/11/17 was 41 out 110, with higher scores indicating more severe symptoms.    To review, she was first seen in  March 2016, she reported decreased energy and 60 pound weight gain over the past year. She reported being on 5 antibiotics as well as oral steroids between August 2015 and March 2016. She was on Augmentin in August, October, and December 2015. In early 2016, she was on Avelox x 7 days (did nothing) and then Ceftin and Biaxin in conjunction with 3 weeks of Medrol. She had allergy testing with Dr. Berlin Hun and was found to have sensitivities to dust mites, tree pollens, and rats ("very allergic ... had them as pets"). She had been previously allergic to dog and rabbit dander and had allergy immunotherapy as a child. She was treated with doxycylcine 03/25/15, Augmentin July 2018 for sinusitis and has had 4-5 episodes of sinusitis every year since 2014. She has been on immunotherapy every other week with Dr. Berlin Hun since 2016. She re-presented in June 2019 with continued recurrent episodes of sinusitis. Her sinusitis episodes consist of night sweats, body aches, head pain, worsened asthma that she feels is related to thick post nasal drip, worsened sense of smell and taste. She feels her allergies may precipitate more severe episodes. She estimates 5-6 episodes in the previous 66-month period ending June 2019. The episodes last 2 weeks and she is often treated with antibiotics. She feels if she is not treated with antibiotics, her asthma  worsens for a few weeks following the infection. Her allergy episodes are characterized by itchy eyes and nose, nasal congestion and clear nasal drainage. She was seen 11/06/2017 in our practice's urgent care clinic for clear drainage form the nose. A beta transferrin test was obtained and was negative. A CT scan was ordered which showed turbinate hypertrophy bilaterally, mucosal thickening in the left > right maxillary sinus and some mild mucosal thickening in the ethmoid sinuses. No signs of skull base defects. At follow-up less than 2 weeks later, she was no longer having clear rhinorrhea from the front of her nose when she bends over. She has no salty or metallic taste in her mouth. No fevers, chills or sweats. She reported that she read about CSF rhinorrhea when researching her symptoms. She was taking Clarinex, Zyrtec, montelukast, Qvar (orally inhaled), albuterol prn. She had not tolerated multiple intranasal steroid sprays due to nose bleeds. She does not use nasal saline irrigations due to time restraints. She saw Dr. [name] (Neurology) in early June 2019 and was prescribed oral steroids for migraine. She feels her migraines are worse when she is having congestion, facial pressure, and drainage. When she takes Imitrex, her headache resolves but the maxillary pressure does not go away. She reported having had a septoplasty and turbinate reduction around the age of 1 and that his helped her be "healthy" for quite some time. She has had her adenoids removed multiple times.    Allergies  She is allergic to morphine; adhesive tape; avelox [moxifloxacin]; environmental allergies; flonase [fluticasone]; shellfish-derived products; and adhesive tape-silicones [other].  PMH  She has a past medical history of Anxiety; Asthma; Complication of anesthesia; Fatigue; GERD (gastroesophageal reflux disease); Herniated lumbar disc without myelopathy; History of rectal abscess; Hyperlipidemia; Hypermobility syndrome;  Hypoglycemia; Insomnia; Irritable bowel syndrome; Migraines; Morbid obesity; Numbness and tingling; OSA on CPAP; Osteoarthritis; Rash; Shortness of breath; Stress incontinence; and Supraventricular arrhythmia.    PSH  She has a past surgical history that includes Tonsillectomy and adenoidectomy; Cesarean Section, Unspecified; lumbar laminectomy; PERIANAL; ANNAL FISSURE; Spine surgery; Cesarean section, classic (1610,9604); pr lap, gast restrict proc, longitudinal gastrectomy (N/A, 08/27/2016); and gastrectomy. She had adenoidectomy in approximately 1982, 1992, and 1998. She had turbinate reduction and septoplasty in approximately 1998.    FH  Her family history includes Asthma in her father, sister, and sister; Cancer in her father, mother, and sister; Depression in her mother; Early death in her mother; High cholesterol in her father; Migraines in her sister and sister; Morbid Obesity in her mother; Obesity in her father; Osteoarthritis in her father, sister, and sister; Stroke in her mother; Thyroid disease in her sister and sister.    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 She  reports that she quit smoking about 14 years ago. She has never used smokeless tobacco. She reports that she currently engages in sexual activity and has had female partners. She reports that she does not drink alcohol or use drugs.    ROS  Positive for items listed in HPI/NSI. Other organ systems negative. Reviewed and scanned into electronic medical record.    PE  Vitals BP 113/67    Pulse 67    Ht 1.702 m (5\' 7" )    Wt 80.3 kg (177 lb)    BMI 27.72 kg/m    General General appearance normal. Alert and cooperative. Voice normal.   Head/Face Normocephalic; atraumatic. No facial skin lesions. Facial strength symmetric.   Eyes Extraocular muscles intact. No nystagmus with lateral gaze.   Ears, Nose, Mouth & Throat Clinical speech thresholds normal. External ears normal. Septum is midline. Mucosa is  edematous and erythematous.    Neck No masses. Normal appearance for age.    Respiratory Normal respiratory effort. No retractions.   Cardiovascular Peripheral vascular system normal.   Lymphatic No palpable cervical adenopathy.   Neuro/Psych CNII-XII grossly normal. Alert and oriented. Mood and affect normal.     Results   CT sinus (08/18/2014, images reviewed with patient on 08/18/2014) - Mild mucosal thickening in the maxillary and ethmoid sinuses. Minimal mucosal thickening in the frontal and sphenoid sinuses. Small left Haller cell.    Culture (03/07/15), corynebacterium species resistant to cefazolin, erythromycin, methicillin, oxacillin, penicillin-G   CT sinus (11/08/17) images reviewed with patient 11/11/17 - Mild mucosal thickening of right maxillary sinus, narrowing the maxillary os on that side, moderate mucosal thickening in the left maxillary sinus, minimal mucosal thickening of the bilateral ethmoid sinuses.    Assessment  1. Recurrent acute rhinosinusitis.  2. Allergic rhinitis.  3. Hypertrophy of inferior turbinates.  4. Migraine.  5. Left facial pressure.  6. History of septoplasty, inferior turbinate reduction.    Plan   She recently underwent nasal endoscopy and review of her CT imaging.    We previously discussed the fact that sinus surgery would not address her allergy symptoms and it may improve her frequency of bacterial sinusitis.   We previously discussed that she should continue to optimize her allergies with Dr. Berlin Hun. She may benefit from retesting of  her allergies given that she has been on immunotherapy for 3 years.   We again discussed the risks and benefits of sinus surgery. She would be most likely to benefit from bilateral maxillary antrostomy, anterior ethmoidectomy, turbinate reduction, possible balloon dilation of the frontal sinuses as an outpatient procedure. She elected to proceed with balloon dilation of the frontal recesses. Given the summer is a better time of year for  her allergies and infections, planning for surgery during that time would be ideal. We had previously discussed the extent of benefit is less certain given the relative lack of disease on the CT imaging. The goal of surgery would be to reduce the frequency of sinusitis episodes requiring antibiotics. We would not expect surgery to improve her allergy symptoms, despite her feeling that in the past allergies improved with septoplasty and turbinate reduction. We previously discussed that sometimes migraine/headache symptoms may transiently and temporarily improve for 6 to 12 months after sinus surgery, but sinus surgery is not a treatment for headache. Given the co-existing allergy and headache symptoms, the amount of symptomatic benefit from endoscopic sinus surgery for recurrent acute rhinosinusitis is less certain.   Lloyd Hugereil Med bottle given to the patient.  Preoperative counseling performed today.   She has elected to proceed with surgery on July 16. Return to clinic 1 and 2 weeks after surgery. We discussed that due to a planned vacation, I would ask Letta MoynahanKristina Haralambides, NP to perform her first sinonasal debridement after surgery on July 24. We discussed that as she is only having anterior ethmoidectomy and maxillary antrostomy the debridement would be relatively limited. She has scheduled the appointment for July 24 @ 4 pm. I will see her on July 29 @ 7:30 am.     Preoperative Counseling  The patient reports persistent symptoms despite appropriate treatment.    The risks, benefits, and anticipated outcomes of the procedure, the risks and benefits to the alternative of the procedure, and the roles and tasks of the personnel involved were discussed with the patient, and the patient consents to the procedure and agrees to proceed.    Risks of surgery, including but not limited to anesthesia, infection, bleeding, orbital injury, intracranial injury, anosmia/hyposmia, epiphora, voice changes, need for additional  procedures, etc. were reviewed. We discussed specifically that her situation is complicated by multiple conditions (migraine, allergic rhinitis, recurrent sinusitis) contributing to symptoms. The expected benefit is therefore less clear or certain. All questions were answered. The patient expressed understanding and agreed to proceed.

## 2017-12-04 ENCOUNTER — Ambulatory Visit
Admission: AD | Admit: 2017-12-04 | Discharge: 2017-12-04 | Disposition: A | Discharge status: Home / Self Care | Payer: PRIVATE HEALTH INSURANCE | Attending: Family Medicine | Admitting: Family Medicine

## 2017-12-04 DIAGNOSIS — G43711 Chronic migraine without aura, intractable, with status migrainosus: Secondary | ICD-10-CM

## 2017-12-04 DIAGNOSIS — Z87891 Personal history of nicotine dependence: Secondary | ICD-10-CM | POA: Insufficient documentation

## 2017-12-04 MED ORDER — KETOROLAC TROMETHAMINE 30 MG/ML IJ SOLN *I*
30.0000 mg | Freq: Once | INTRAMUSCULAR | Status: AC
Start: 2017-12-04 — End: 2017-12-04
  Administered 2017-12-04: 30 mg via INTRAVENOUS
  Filled 2017-12-04: qty 1

## 2017-12-04 MED ORDER — KETOROLAC TROMETHAMINE 30 MG/ML IJ SOLN *I*
INTRAMUSCULAR | Status: AC
Start: 2017-12-04 — End: 2017-12-04
  Filled 2017-12-04: qty 1

## 2017-12-04 NOTE — Discharge Instructions (Signed)
F/u with pcp, neurologist, and your physical therapist.  Rest in a dark room  Drink lots of water.  Use your usual migraine medication.  Consider acupuncture.

## 2017-12-04 NOTE — UC Provider Note (Signed)
History     Chief Complaint   Patient presents with    Migraine     x 3 days has been taking imatrex as prescribed with no relief     X 3 days with a migraine (typical) been using Imitrex and no relief. Is requesting Toradol today. Has seen neurologist and they are trying botox therapy for migraines. Has seen podiatrist with shots of lidocaine that has helped. Has taken 1 tramadol today and 1 Imitrex. Pt has had 8 ucc visits since 3/3 for migraines and sinus issues.            Medical/Surgical/Family History     Past Medical History:   Diagnosis Date    Anxiety     Asthma     managed by Dr. Nicholes Stairs    Complication of anesthesia     hypotension    Fatigue     GERD (gastroesophageal reflux disease)     Herniated lumbar disc without myelopathy     History of rectal abscess     Hyperlipidemia     not being treated    Hypermobility syndrome     Hypoglycemia     Insomnia     Irritable bowel syndrome     Migraines     Morbid obesity     Numbness and tingling     OSA on CPAP     Osteoarthritis     Rash     under hanging skin    Shortness of breath     Stress incontinence     Supraventricular arrhythmia         Patient Active Problem List   Diagnosis Code    BMI 36.0-36.9,adult Z68.36    Morbid obesity E66.01    Asthma J45.909    GERD (gastroesophageal reflux disease) K21.9    Osteoarthritis M19.90    Sleep apnea G47.30    Hyperlipidemia E78.5    BMI 35.0-35.9,adult Z68.35    Bariatric surgery status Z98.84    BMI 31.0-31.9,adult Z68.31    BMI 31.0-31.9,adult Z68.31    BMI 31.0-31.9,adult Z68.31    BMI 28.0-28.9,adult Z68.28    Malabsorption K90.9    BMI 27.0-27.9,adult Z68.27    Recurrent acute sinusitis J01.91    Migraine G43.909    Hypertrophy of inferior nasal turbinate J34.3    Allergic rhinitis J30.9    Acute recurrent maxillary sinusitis J01.01    Acute recurrent ethmoidal sinusitis J01.21            Past Surgical History:   Procedure Laterality Date    ANNAL FISSURE       & Fist[place] septic    CESAREAN SECTION, CLASSIC  [id number]    CESAREAN SECTION, UNSPECIFIED      GASTRECTOMY      LUMBAR LAMINECTOMY      PERIANAL      REMOVAL OF VENOUS THROMBOSIS    PR LAP, GAST RESTRICT PROC, LONGITUDINAL GASTRECTOMY N/A 08/27/2016    Procedure: LAPAROSCOPIC GASTRECTOMY SLEEVE;  Surgeon: Darrell Jewel, MD;  Location: [facility];  Service: General    SPINE SURGERY      TONSILLECTOMY AND ADENOIDECTOMY       Family History   Problem Relation Age of Onset    Cancer Mother         glioblastoma    Depression Mother     Early death Mother     Morbid Obesity Mother     Stroke Mother     Osteoarthritis Father  Asthma Father     Cancer Father     High cholesterol Father     Obesity Father     Asthma Sister     Cancer Sister     Osteoarthritis Sister     Osteoarthritis Sister     Migraines Sister     Migraines Sister     Thyroid disease Sister     Thyroid disease Sister     Asthma Sister           Social History   Substance Use Topics    Smoking status: Former Smoker     Quit date: 06/05/2003    Smokeless tobacco: Never Used    Alcohol use No     Living Situation     Questions Responses    Patient lives with     Homeless     Caregiver for other family member     External Services     Employment     Domestic Violence Risk                 Review of Systems   Review of Systems   Constitutional: Negative for fever.   HENT: Negative.    Eyes: Negative for photophobia.   Respiratory: Negative.    Musculoskeletal: Negative for back pain and neck pain.   Neurological: Positive for headaches. Negative for dizziness, seizures, speech difficulty, weakness, light-headedness and numbness.   All other systems reviewed and are negative.      Physical Exam   Triage Vitals  Triage Start: Start, (12/04/17 1208)   First Recorded BP: 118/76, Resp: 18, Temp: 36 C (96.8 F), Temp src: TEMPORAL Oxygen Therapy SpO2: 100 %, Oximetry Source: Rt Hand, O2 Device: None (Room air), Heart Rate: 57,  (12/04/17 1210)  .  First Pain Reported  0-10 Scale: 5, Pain Location/Orientation: Head, (12/04/17 1210)       Physical Exam   Constitutional: She is oriented to person, place, and time. She appears well-developed and well-nourished.   Looks well and in nad.   Eyes: Pupils are equal, round, and reactive to light. Conjunctivae and EOM are normal.   Neck: Normal range of motion.   Pulmonary/Chest: Effort normal.   Neurological: She is oriented to person, place, and time.   Right frontal h/a 4/10.   Skin: Skin is warm and dry.   Psychiatric: She has a normal mood and affect. Her behavior is normal. Judgment and thought content normal.   Nursing note and vitals reviewed.       Medical Decision Making      "Feels better" after toradol and ready to go. Pt was playing on phone upon entering room.    Initial Evaluation:  ED First Provider Contact     Date/Time Event User Comments    12/04/17 1209 ED First Provider Contact Romana Juniper, Jamayah Myszka Initial Face to Face Provider Contact          Patient was seen on: 12/04/2017        Assessment:  42 y.o.female [facility] with X 3 days with a migraine (typical) been using Imitrex and no relief. Is requesting Toradol today. Has seen neurologist and they are trying botox therapy for migraines. Has seen podiatrist with shots of lidocaine that has helped. Has taken 1 tramadol today and 1 Imitrex. Pt has had 8 ucc visits since 3/3 for migraines and sinus issues.    Differential Diagnosis includes:  Migraines  headache    Plan:  F/u with pcp, neurologist, and your physical therapist.  Rest in a dark room  Drink lots of water.  Use your usual migraine medication.  Consider acupuncture.         Final Diagnosis  Final diagnoses:   [G43.711] Intractable chronic migraine without aura and with status migrainosus (Primary)         Hunter Pinkard Van Auker, NPBlanchie Dessert       I have reviewed nursing documentation, confirmed information with patient, and revised with nursing as necessary.        Marylen PontoVanauker, Mikella Linsley, NP  12/04/17 1309

## 2017-12-10 NOTE — Anesthesia Preprocedure Evaluation (Addendum)
Anesthesia Pre-operative History and Physical for Shari Harvey    Highlighted Issues for this Procedure:  42 y.o. female with Chronic maxillary sinusitis (J32.0)  Nasal turbinate hypertrophy (J34.3) presenting for Procedure(s) (LRB):  TURBINECTOMY (TOTAL CASE LENGTH 150 MINUTES) (Bilateral)  ENDOSCOPIC SINUS SURGERY PARTIAL ETHMOID, MAX, BALLOON FRONTAL with IGS (Bilateral) by Surgeon(s):  Man, Hampton Abbot, MD scheduled for 165 minutes.    "[name]"   Sinus infections, difficulty breathing, PND, migraines - all secondary to sinus infections.  Albuterol (pollen season - is daily); Currently none.  Breathing at baseline now.  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  CPM Summary:  I have reviewed the RN anesthesia screening information and erecords for [facility]. The patient is scheduled for  TURBINECTOMY (TOTAL CASE LENGTH 150 MINUTES) (Bilateral )   ENDOSCOPIC SINUS SURGERY PARTIAL ETHMOID, MAX, BALLOON FRONTAL (Bilateral ) on 12/17/17 with Dr. Man.     PMH  Hx of SVT-known to [facility]-last appt 10/15/12  Asthma-no symptoms at present.   OSA-AHI 21, wore CPAP, then had a gastric sleeve with weight loss, stopped using CPAP  BMI 27.7  GERD omeprazole  ehler's danlos syndrome  HLD  Migraines  Allergic rhinitis  Cervicalgia-FROM in PT note 11/06/17(CE)    GA: 08/27/16 DL, Grade 1, ETT 7, Mallampati: II, no issues    Per RN screen, pt denies any chest pain or shortness of breath. She states that in the past she has developed post operative hypotension, but states that with her surgery in 08/2016 she had no problems.  Pt states that she had a laminectomy in 2013 at [facility] and her BP was in the  70's/40's for hours. She also had 2 c sections 2008, 2010 and it took her hours for her BP to come back to normal. She states that both her father and sister have the same issues. She has seen a Cardiologist for SVT and there has been no clear reason for it to occur.  Anesthesia record from [facility] from 2013, laminectomy, now scanned under media, indicates that her BP range was 113/67-130/82.    D/W Dr. Alvy Beal.      CPM Chart Review onlyBy Jannetta Quint, NP at 9:01 AM on 12/10/2017    Anesthesia Evaluation Information Source: patient, family, records     ANESTHESIA HISTORY    + history of anesthetic complications (history of hypotension)  Pertinent(-):  No Family hx of anesthetic complications    GENERAL     Denies general issues  Pertinent (-):  No obesity    HEENT    + Sinus Issues            allergic rhinitis and chronic sinusitis    + Neck Pain (cervicalgia-FROM) PULMONARY    + Asthma  Pertinent(-):  No smoking, recent URI, cough/congestion or sleep apnea (aftyer gastric bypass, stopped using cpap)    CARDIOVASCULAR  Good(4+METs) Exercise Tolerance    + Hx of Dysrhythmias          SVT    + Vascular Issues (ehler's danlos syndrome )  Pertinent(-):  No hypertension    Comment: SVT - dehydrated.  GI/HEPATIC/RENAL  Last PO Intake: >8hr before procedure and >2hr before procedure (clears)    + GERD          well controlled    + Bowel Issues          gastric reduction  Pertinent(-):  No liver  issues or renal issues  Comment: Lost 75 lbs from gastric bypass  Occasionally nauseated, no vomiting NEURO/PSYCH    + Headaches          migraines  Pertinent(-):  No dizziness/motion sickness, seizures or cerebrovascular event    Comment: Can take tylenol  Can take toradol    ENDO/OTHER     Denies endo issues  Pertinent(-):  No diabetes mellitus, thyroid disease    HEMATOLOGIC    + Blood dyscrasia          hyperlipidemia    + Arthritis  Pertinent(-):  No bruising/bleeding easily, coagulopathy or anticoagulants/antiplatelet  medications       Physical Exam    Airway            Mouth opening: normal            Mallampati: I            TM distance (fb): >3 FB            Neck ROM: full  Dental   Normal Exam   Cardiovascular           Rhythm: regular           Rate: normal        General Survey    Normal Exam   Pulmonary   Normal Exam    breath sounds clear to auscultation    Mental Status   Normal Exam         ________________________________________________________________________  PLAN  ASA Score  2  Anesthetic Plan general     Induction (routine IV) General Anesthesia/Sedation Maintenance Plan (inhaled agents);  Airway Manipulation (direct laryngoscopy); Airway (cuffed ETT); Line ( use current access); Monitoring (standard ASA); Positioning (supine); PONV Plan (dexamethasone, ondansetron and promethazine); Pain (per surgical team and intraop local); PostOp (PACU)    Informed Consent     Risks:          Risks discussed were commensurate with the plan listed above with the following specific points: N/V, aspiration and sore throat , damage to:(teeth, nerves, eyes), allergic Rx, unexpected serious injury    Anesthetic Consent:         Anesthetic plan (and risks as noted above) were discussed with patient and spouse    Plan also discussed with team members including:       CRNA    Attending Attestation:  As the primary attending anesthesiologist, I attest that the patient or proxy understands and accepts the risks and benefits of the anesthesia plan. I also attest that I have personally performed a pre-anesthetic examination and evaluation, and prescribed the anesthetic plan for this particular location within 48 hours prior to the anesthetic as documented. Devin GoingMarjorie Leoma Folds, MD 9:38 AM

## 2017-12-11 ENCOUNTER — Encounter: Payer: Self-pay | Admitting: Nurse Practitioner

## 2017-12-17 ENCOUNTER — Encounter: Payer: Self-pay | Admitting: Nurse Practitioner

## 2017-12-17 ENCOUNTER — Encounter
Admission: RE | Disposition: A | Discharge status: Home / Self Care | Payer: Self-pay | Source: Ambulatory Visit | Attending: Otolaryngology

## 2017-12-17 ENCOUNTER — Encounter: Payer: PRIVATE HEALTH INSURANCE | Admitting: Nurse Practitioner

## 2017-12-17 ENCOUNTER — Ambulatory Visit
Admission: RE | Admit: 2017-12-17 | Discharge: 2017-12-17 | Disposition: A | Discharge status: Home / Self Care | Payer: PRIVATE HEALTH INSURANCE | Source: Ambulatory Visit | Attending: Otolaryngology | Admitting: Otolaryngology

## 2017-12-17 DIAGNOSIS — J320 Chronic maxillary sinusitis: Secondary | ICD-10-CM | POA: Insufficient documentation

## 2017-12-17 DIAGNOSIS — J322 Chronic ethmoidal sinusitis: Secondary | ICD-10-CM | POA: Insufficient documentation

## 2017-12-17 DIAGNOSIS — Z87891 Personal history of nicotine dependence: Secondary | ICD-10-CM | POA: Insufficient documentation

## 2017-12-17 DIAGNOSIS — J343 Hypertrophy of nasal turbinates: Secondary | ICD-10-CM | POA: Insufficient documentation

## 2017-12-17 DIAGNOSIS — J0121 Acute recurrent ethmoidal sinusitis: Secondary | ICD-10-CM

## 2017-12-17 DIAGNOSIS — J0111 Acute recurrent frontal sinusitis: Secondary | ICD-10-CM

## 2017-12-17 DIAGNOSIS — J45909 Unspecified asthma, uncomplicated: Secondary | ICD-10-CM | POA: Insufficient documentation

## 2017-12-17 DIAGNOSIS — J321 Chronic frontal sinusitis: Secondary | ICD-10-CM | POA: Insufficient documentation

## 2017-12-17 HISTORY — DX: Ehlers-Danlos syndrome, unspecified: Q79.60

## 2017-12-17 HISTORY — PX: PR EXCISION TURBINATE,SUBMUCOUS: 30140

## 2017-12-17 HISTORY — PX: PR NASAL/SINUS NDSC W/PARTIAL ETHMOIDECTOMY: 31254

## 2017-12-17 HISTORY — PX: PR SUBMUCOUS RESCJ INFERIOR TURBINATE PRTL/COMPL: 30140

## 2017-12-17 LAB — POCT URINE PREGNANCY: Lot #: 190701

## 2017-12-17 SURGERY — EXCISION, NASAL TURBINATE
Anesthesia: General | Site: Nose | Laterality: Bilateral | Wound class: Clean Contaminated

## 2017-12-17 MED ORDER — HYDROMORPHONE HCL PF 1 MG/ML IJ SOLN *WRAPPED*
INTRAMUSCULAR | Status: DC | PRN
Start: 2017-12-17 — End: 2017-12-17
  Administered 2017-12-17: 0.5 mg via INTRAVENOUS
  Administered 2017-12-17: 1 mg via INTRAVENOUS
  Administered 2017-12-17: 0.5 mg via INTRAVENOUS

## 2017-12-17 MED ORDER — DEXTROSE IN LACTATED RINGERS 5 % IV SOLN *I*
100.0000 mL/h | INTRAVENOUS | Status: DC
Start: 2017-12-17 — End: 2017-12-18

## 2017-12-17 MED ORDER — HALOPERIDOL LACTATE 5 MG/ML IJ SOLN *I*
INTRAMUSCULAR | Status: AC
Start: 2017-12-17 — End: 2017-12-17
  Filled 2017-12-17: qty 1

## 2017-12-17 MED ORDER — ACETAMINOPHEN 160 MG/5 ML WRAPPED *I*
975.0000 mg | Freq: Once | Status: AC
Start: 2017-12-17 — End: 2017-12-17

## 2017-12-17 MED ORDER — SUGAMMADEX SODIUM 100 MG/1ML IV SOLN *WRAPPED*
INTRAVENOUS | Status: DC | PRN
Start: 2017-12-17 — End: 2017-12-17
  Administered 2017-12-17: 100 mg via INTRAVENOUS

## 2017-12-17 MED ORDER — PROMETHAZINE HCL 25 MG/ML IJ SOLN *I*
6.2500 mg | INTRAMUSCULAR | Status: DC | PRN
Start: 2017-12-17 — End: 2017-12-18
  Administered 2017-12-17: 6.25 mg via INTRAVENOUS

## 2017-12-17 MED ORDER — ONDANSETRON HCL 2 MG/ML IV SOLN *I*
INTRAMUSCULAR | Status: DC | PRN
Start: 2017-12-17 — End: 2017-12-17
  Administered 2017-12-17: 4 mg via INTRAMUSCULAR

## 2017-12-17 MED ORDER — OXYCODONE HCL 5 MG PO TABS *I*
5.0000 mg | ORAL_TABLET | ORAL | 0 refills | Status: DC | PRN
Start: 2017-12-17 — End: 2017-12-30

## 2017-12-17 MED ORDER — OXYCODONE HCL 5 MG/5ML PO SOLN *I*
10.0000 mg | Freq: Once | ORAL | Status: AC | PRN
Start: 2017-12-17 — End: 2017-12-17
  Administered 2017-12-17: 10 mg via ORAL

## 2017-12-17 MED ORDER — HALOPERIDOL LACTATE 5 MG/ML IJ SOLN *I*
1.0000 mg | Freq: Once | INTRAMUSCULAR | Status: DC | PRN
Start: 2017-12-17 — End: 2017-12-18
  Administered 2017-12-17: 1 mg via INTRAVENOUS

## 2017-12-17 MED ORDER — MIDAZOLAM HCL 1 MG/ML IJ SOLN *I* WRAPPED
INTRAMUSCULAR | Status: DC | PRN
Start: 2017-12-17 — End: 2017-12-17
  Administered 2017-12-17 (×2): 2 mg via INTRAVENOUS

## 2017-12-17 MED ORDER — LIDOCAINE HCL (PF) 1 % IJ SOLN *I*
INTRAMUSCULAR | Status: AC
Start: 2017-12-17 — End: 2017-12-17
  Filled 2017-12-17: qty 2

## 2017-12-17 MED ORDER — CEFAZOLIN 1000 MG IN STERILE WATER 10ML SYRINGE *I*
PREFILLED_SYRINGE | INTRAVENOUS | Status: AC
Start: 2017-12-17 — End: 2017-12-17
  Filled 2017-12-17: qty 20

## 2017-12-17 MED ORDER — LIDOCAINE HCL 2 % IJ SOLN *I*
INTRAMUSCULAR | Status: DC | PRN
Start: 2017-12-17 — End: 2017-12-17
  Administered 2017-12-17: 80 mg via INTRAVENOUS

## 2017-12-17 MED ORDER — OXYCODONE HCL 5 MG/5ML PO SOLN *I*
5.0000 mg | Freq: Once | ORAL | Status: AC | PRN
Start: 2017-12-17 — End: 2017-12-17

## 2017-12-17 MED ORDER — CEFAZOLIN 1000 MG IN STERILE WATER 10ML SYRINGE *I*
2000.0000 mg | PREFILLED_SYRINGE | Freq: Once | INTRAVENOUS | Status: AC
Start: 2017-12-17 — End: 2017-12-17

## 2017-12-17 MED ORDER — DEXAMETHASONE SODIUM PHOSPHATE 4 MG/ML INJ SOLN *WRAPPED*
INTRAMUSCULAR | Status: DC | PRN
Start: 2017-12-17 — End: 2017-12-17
  Administered 2017-12-17: 4 mg via INTRAVENOUS

## 2017-12-17 MED ORDER — ACETAMINOPHEN 325 MG PO TABS *I*
ORAL_TABLET | ORAL | Status: AC
Start: 2017-12-17 — End: 2017-12-17
  Filled 2017-12-17: qty 3

## 2017-12-17 MED ORDER — OXYMETAZOLINE HCL 0.05 % NA SOLN *I*
NASAL | Status: DC | PRN
Start: 2017-12-17 — End: 2017-12-17
  Administered 2017-12-17: 20 mL via TOPICAL
  Administered 2017-12-17: 10 mL via TOPICAL

## 2017-12-17 MED ORDER — PROPOFOL 10 MG/ML IV EMUL (INTERMITTENT DOSING) WRAPPED *I*
INTRAVENOUS | Status: DC | PRN
Start: 2017-12-17 — End: 2017-12-17
  Administered 2017-12-17: 120 mg via INTRAVENOUS
  Administered 2017-12-17: 20 mg via INTRAVENOUS

## 2017-12-17 MED ORDER — ALBUTEROL SULFATE (2.5 MG/3ML) 0.083% IN NEBU *I*
2.5000 mg | INHALATION_SOLUTION | Freq: Once | RESPIRATORY_TRACT | Status: AC | PRN
Start: 2017-12-17 — End: 2017-12-17

## 2017-12-17 MED ORDER — ROCURONIUM BROMIDE 10 MG/ML IV SOLN *WRAPPED*
Status: DC | PRN
Start: 2017-12-17 — End: 2017-12-17
  Administered 2017-12-17: 50 mg via INTRAVENOUS

## 2017-12-17 MED ORDER — ONDANSETRON HCL 2 MG/ML IV SOLN *I*
1.0000 mg | Freq: Once | INTRAMUSCULAR | Status: AC | PRN
Start: 2017-12-17 — End: 2017-12-17

## 2017-12-17 MED ORDER — ACETAMINOPHEN 325 MG PO TABS *I*
975.0000 mg | ORAL_TABLET | Freq: Once | ORAL | Status: AC
Start: 2017-12-17 — End: 2017-12-17
  Administered 2017-12-17: 975 mg via ORAL

## 2017-12-17 MED ORDER — PROMETHAZINE HCL 25 MG/ML IJ SOLN *I*
INTRAMUSCULAR | Status: AC
Start: 2017-12-17 — End: 2017-12-17
  Filled 2017-12-17: qty 1

## 2017-12-17 MED ORDER — ACETAMINOPHEN 160 MG PO CHEW WRAPPED *I*
960.0000 mg | CHEWABLE_TABLET | Freq: Once | ORAL | Status: AC
Start: 2017-12-17 — End: 2017-12-17

## 2017-12-17 MED ORDER — OXYCODONE HCL 5 MG/5ML PO SOLN *I*
ORAL | Status: AC
Start: 2017-12-17 — End: 2017-12-17
  Filled 2017-12-17: qty 10

## 2017-12-17 MED ORDER — LIDOCAINE-EPINEPHRINE 1 %-1:100000 IJ SOLN *I*
INTRAMUSCULAR | Status: DC | PRN
Start: 2017-12-17 — End: 2017-12-17
  Administered 2017-12-17: 5.5 mL via SUBCUTANEOUS
  Administered 2017-12-17: 4.5 mL via SUBCUTANEOUS

## 2017-12-17 MED ORDER — LIDOCAINE HCL (PF) 1 % IJ SOLN *I*
0.1000 mL | Freq: Once | INTRAMUSCULAR | Status: DC | PRN
Start: 2017-12-17 — End: 2017-12-18
  Administered 2017-12-17: 0.1 mL via SUBCUTANEOUS

## 2017-12-17 MED ORDER — MIDAZOLAM HCL 1 MG/ML IJ SOLN *I* WRAPPED
INTRAMUSCULAR | Status: AC
Start: 2017-12-17 — End: 2017-12-17
  Filled 2017-12-17: qty 4

## 2017-12-17 MED ORDER — HALOPERIDOL LACTATE 5 MG/ML IJ SOLN *I*
INTRAMUSCULAR | Status: DC | PRN
Start: 2017-12-17 — End: 2017-12-17
  Administered 2017-12-17: 1 mg via INTRAVENOUS

## 2017-12-17 MED ORDER — LACTATED RINGERS IV SOLN *I*
20.0000 mL/h | INTRAVENOUS | Status: DC
Start: 2017-12-17 — End: 2017-12-18
  Administered 2017-12-17: 20 mL/h via INTRAVENOUS

## 2017-12-17 MED ORDER — CEFAZOLIN SODIUM 1000 MG IJ SOLR *I*
INTRAMUSCULAR | Status: DC | PRN
Start: 2017-12-17 — End: 2017-12-17
  Administered 2017-12-17: 2000 mg via INTRAVENOUS

## 2017-12-17 MED ORDER — HYDROMORPHONE HCL PF 1 MG/ML IJ SOLN *WRAPPED*
INTRAMUSCULAR | Status: AC
Start: 2017-12-17 — End: 2017-12-17
  Filled 2017-12-17: qty 1

## 2017-12-17 MED ORDER — SUGAMMADEX SODIUM 100 MG/1ML IV SOLN *WRAPPED*
INTRAVENOUS | Status: AC
Start: 2017-12-17 — End: 2017-12-17
  Filled 2017-12-17: qty 2

## 2017-12-17 SURGICAL SUPPLY — 41 items
BALLON RELIEVE SPIN PLUS 6X16MM (Balloon) ×3 IMPLANT
BLADE FUSION ROTAT CRV RADIUS 40 4MM (Supply) IMPLANT
BLADE FUSION ROTAT RADIUS 12 4MM (Supply) IMPLANT
BLADE FUSION ROTAT TRICUT STR 4MM (Supply) IMPLANT
BLADE RAD 60 4MM (Supply)
BLADE RAD 90 CRV HR 3.5MM X 11CM (Supply) IMPLANT
BLADE SHV L11CM DIA4MM 5000RPM SNUS 60DEG CRV SHFT STRAIGHTSHOT M4 ROT RAD (Supply) IMPLANT
BLADE SHV L11CM DIA4MM STR SERR M4 ROT W/ IRRIG TBNG FOR ETHMOIDECTOMY TRICUT (Supply) ×2 IMPLANT
BLADE TRICUT 4MM (Supply) ×2
BLADE TURBINATE HI SPEED ROTAT 2.9MM (Supply) ×3 IMPLANT
COAGULATOR SUCT BLUE DISP (Supply) IMPLANT
CONTAINER FORMALIN PREFILL 90ML (Solution) ×6 IMPLANT
CONTAINER SPECIMEN FORMALIN PREFILL 90ML (Solution) ×4 IMPLANT
DEVICE INFL FOR SNUS EUSTACHIAN TB PRSS MON ACCLARENT SE (Other) ×2 IMPLANT
DEVICE SE INFLATION (Other) ×1
DRAPE X-RAY CASSETTE BAG (Drape) ×16 IMPLANT
DRESSING NASOPORE STD 4CM (Dressing) IMPLANT
DRESSING NASOPORE STD 8CM (Dressing) ×3 IMPLANT
GLOVE BIOGEL PI MICRO IND UNDER SZ 6.5 LF (Glove) ×12 IMPLANT
GLOVE BIOGEL PI MICRO SZ 6.5 (Glove) ×9 IMPLANT
GLOVE BIOGEL PI MICRO SZ 8 (Glove) ×9 IMPLANT
GLOVE SURG BIOGEL PI ULTRATOUCH SZ 7.5 (Glove) ×11 IMPLANT
GOWN SIRIUS NONREINFORCED SET IN L (Gown) ×15 IMPLANT
LINER SUCT CANISTER SEMI RIGID 1000CC (Supply) ×12 IMPLANT
NEEDLE QUINCKE SPI 22G X 3.5IN (Needle) ×4 IMPLANT
PACK CUSTOM SINUS (Pack) ×4 IMPLANT
PATTIES SURG 1/2 X 3IN (Sponge) ×4 IMPLANT
POSITIONER HEAD CRADLE FOAM (Other) ×4 IMPLANT
SOL H2O IRRIG STER 1000ML BTL (Solution) ×2
SOL SOD CHL IRRIG 500ML BTL (Solution) ×4 IMPLANT
SOL WATER IRRIG STERILE 1000ML BTL (Solution) ×6 IMPLANT
SOLIDIFIER LIQUILOC 1500CC (Supply) ×12 IMPLANT
SYRINGE IRRIG BULB 50CC (Syringe) ×4 IMPLANT
TRACER FUSION INSTR ENT (Other) ×4 IMPLANT
TRACKER FUSION PATIENT ENT (Other) ×4 IMPLANT
TRAP SPECIMEN COLLECT SOCK (Supply) ×8 IMPLANT
TRAP SUCT MUCOUS COLLECT 40ML (Supply) ×6 IMPLANT
TRAY FOLEY SIL PREC400 PREM U/M 14FR (Tray) ×4 IMPLANT
TUBING IRRIG F/MAGNUM ENT (Tubing) IMPLANT
WAND PROCISE EZ VIEW (Supply) IMPLANT
WAND REFLEX ULTRA 45 PLASMA (Supply) IMPLANT

## 2017-12-17 NOTE — Discharge Instructions (Addendum)
Discharge Instructions for Endoscopic Sinus Surgery - Dr. Man    The following guidelines are meant to assist you in the immediate post operative period following surgery of the nose and sinuses, including endoscopic sinus surgery.    Activity: Although responses vary, most often sinus surgery is usually very well tolerated, depending on the type of anesthesia. Rest at home for one day and then gradually increase your activity. Do not bend, lift or strain for at least one week after surgery. Avoid strenuous activity for approximately two weeks.    Diet: Gradually resume a normal diet, beginning with clear liquids and then advancing to a soft diet. Nausea may occur following anesthesia and usually clears after a few hours. If nausea and vomiting persist, notify the office.     Nasal Discharge: This usually occurs for 1-2 days following surgery, and may be bloody at first, changing to a blood tinged mucus. Any bright red bleeding which persists should be reported. Change your nasal drip pad as needed. Please avoid blowing your nose until after the first postoperative visit with your doctor.    Nasal Congestion: A stuffy nose is normal following sinus surgery, due to the swelling of the tissues. This can be alleviated by humidification of the nose with a cool mist vaporizer/humidifier. Irrigate your nose 4-6 times daily with saline using the [facility] Sinus Rinse squeeze bottle while healing. Use one whole bottle of saline, half in each nostril, each time.    Pain: Pain is infrequent following sinus surgery. Pain medication may be prescribed or you may use Tylenol. Please do not take aspirin, ibuprofen, naproxen, or other non-steroidal anti-inflammatory drugs (NSAIDs).    Additional Instructions: Please notify the office if any of the following occurs:  1. Visual problems consisting of loss of vision, double vision, or bulging of one or both eyes.  2. Fever persisting above 101.5.  3. Neck stiffness with fever and/or  severe headache.  4. Brisk bleeding from nose which is uncontrollable.    Any additional questions or concerns may be directed to a medical professional at our office at [phone].    About your medications from today    [x]  Prescription information provided from onsite pharmacy.   []  Prescription information given to patient and/or patient representative, prescription not filled at onsite pharmacy.      Your last pain medication was given to you at: Tylenol given at 9am.  Roxicodone given at 1:30pm.    Your next dose of pain medication is due after: May take tylenol at 3pm. May take oxycodone at 5:30pm.

## 2017-12-17 NOTE — Op Note (Signed)
Operative Note (Surgical Case/Log ID: [id number])     SURGEON: Hampton Abbot Jacon Whetzel, MD  ASSISTANT: Loa Socks, MD  SURGERY DATE: 12/17/2017    PREOPERATIVE DIAGNOSES:   1. 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  2. Recurrent acute ethmoid rhinosinusitis, ICD-10 J01.21.  3. Recurrent acute frontal rhinosinusitis, ICD-10 J01.11.  4. Inferior turbinate hypertrophy, ICD-10 J34.3.    POSTOPERATIVE DIAGNOSES:   1. 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  2. Recurrent acute ethmoid rhinosinusitis, ICD-10 J01.21.  3. Recurrent acute frontal rhinosinusitis, ICD-10 J01.11.  4. Inferior turbinate hypertrophy, ICD-10 J34.3.    PROCEDURES:   1. Bilateral nasal endoscopy with partial ethmoidectomy, CPT 31254.  2. Bilateral nasal endoscopy with maxillary antrostomy, CPT 31256.  3. Bilateral nasal endoscopy with balloon dilation of frontal sinus ostium, CPT 31296.  4. Bilateral nasal endoscopy with submucous inferior turbinate reduction, CPT 30140.    ANESTHESIA:   General endotracheal.     ESTIMATED BLOOD LOSS:   200 mL.     IV FLUIDS:  1000 mL.    URINE OUTPUT:  None.    COMPLICATIONS:   None.     INDICATIONS FOR PROCEDURE:   Shari Harvey is a 42 y.o. female who presented with recurrent acute rhinosinusitis episodes that are debilitating as well as chronic maxillary sinusitis on CT imaging. The risks, benefits, and alternatives of the procedure were discussed, specifically the risks of bleeding, infection, injury to the eye, injury to the brain, CSF leak, the need for additional surgery in the future, and the fact that endoscopic sinus surgery, in itself, is not a cure for her disease. She acknowledged understanding and wished to proceed.    FINDINGS:   Frontal ostia nicely dilated with visualization into the frontal sinus using a 30 degree scope after dilation.    DESCRIPTION OF PROCEDURE:  The patient was identified in the holding area and consent was confirmed. She was then brought back to the operating room and  laid supine on the operating room table. Two grams of Cefazolin were given. General anesthesia with endotracheal intubation was adequately established. The table was turned 180 degrees. The presurgical time out was performed. A vibrissectomy was performed. Oxymetazoline-soaked pledgets were placed intranasally bilaterally.     After draping, we proceeded in inspecting the sinonasal cavities bilaterally with a 0-degree endoscope. We proceeded on the left side as there was more disease on this side.     We injected the anterior maxillary line and sphenopalatine area with 1% lidocaine with epinephrine diluted 1:100,000. Oxymetazoline-soaked cottonoids were placed in the middle meatus. We then proceeded with the uncinectomy. We medialized the uncinate and then with a backbiter performed an inferior uncinectomy cutting from a posterior-to-anterior direction towards its anterior and lateral attachment to the lateral wall. We then used the 90-degree Blakesley to push, pull, and avulse the rest of the uncinate from its anterior and lateral attachments.    Next, we switched to the 30-degree endoscope. We entered the maxillary sinus via its natural ostium using a curved suction. The maxillary antrostomy was refined with the straight through-cut, backbiter, downbiter, as well as the microdebrider. We switched to the 45-degree endoscope to ensure that the antrostomy was inclusive of the natural os. This completed our maxillary antrostomy.    We switched back to a 0-degree scope and then penetrated into the area of the anterior ethmoid. The ethmoid bulla was taken with a 55-degree curette. We removed the anterior and medial walls of the anterior ethmoid cells and proceeded to laterally until  the partitions were flush with the medial orbit. We localized on the orbit and confirmed our position on lateral extension.    At this point, we suctioned out the sinonasal cavity and placed oxymetazoline-soaked pledgets.    We then  proceeded on the opposite side. We switched back to a 0-degree endoscope. 1% lidocaine diluted 1:100,000 with epinephrine was injected along the anterior maxillary line and sphenopalatine artery area. Oxymetazoline-soaked pledgets were placed in the middle meatus.     The uncinate was medialized and removed in similar fashion to the previous side. Next, we switched to the 30-degree endoscope. We entered the maxillary sinus via its natural ostium using a curved suction. The maxillary antrostomy was refined with the straight through-cut, backbiter, downbiter, as well as the microdebrider. We switched to the 45-degree endoscope to ensure that the antrostomy was inclusive of the natural os. This completed our maxillary antrostomy.    We then proceeded by first removing the ethmoid bulla. We removed the medial wall of the bulla all the way laterally so that it was flush against the orbit.     We introduced the Acclarent Spin Plus balloon system into the right nasal cavity. The guidewire was advanced into the frontal sinus such that there was obvious transillumination of the right forehead. The balloon was inflated to 12 atmospheres. This was repeated slightly inferiorly to the first dilation. At this point we could visualize the apex of the frontal sinus. The posterior wall of the most inferior area dilated was removed using through-cutting instruments to reduce the possibility of re-stenosis.    The same process for the left frontal recess was repeated with the Acclarent Spin Plus dilation system. We could visualize the apex of the left frontal sinus as well.     We switched to a 0-degree endoscope. The inferior turbinates were injected with 1% lidocaine diluted 1:100,000 with epinephrine. Stab incisions were made in the anterior aspects of the inferior turbinates. Submucosal tunnels were established with a Risk analystCottle elevator. The 2.9 mm inferior turbinate microdebrider blade was used to remove submucosal soft tissue and  bone.     We then suctioned out the sinonasal cavity, removing blood, blood clots, and any stray bits of bone. NasoPore 4 cm was placed in each nasal cavity in order to keep the middle turbinates medialized.     An orogastric tube was placed temporarily to suction stomach contents. At the end of the procedure, the patient's anesthesia was reversed, and she was awakened in a satisfactory condition and was sent to recovery area for observation.    I was present and actively participated throughout the entire procedure.    Signed:  Hampton AbbotLi Xing Makynna Manocchio, MD  on 12/17/2017 at 1:17 PM

## 2017-12-17 NOTE — Anesthesia Procedure Notes (Signed)
---------------------------------------------------------------------------------------------------------------------------------------    AIRWAY   GENERAL INFORMATION AND STAFF    Patient location during procedure: OR       Date of Procedure: 12/17/2017 11:17 AM  CONDITION PRIOR TO MANIPULATION     Current Airway/Neck Condition:  Normal        For more airway physical exam details, see Anesthesia PreOp Evaluation  AIRWAY METHOD     Patient Position:  Sniffing    Preoxygenated: yes      Induction: IV  Mask Difficulty Assessment:  0 - not attempted      Technique Used for Successful ETT Placement:  Direct laryngoscopy    Blade Type:  Macintosh    Laryngoscope Blade/Video laryngoscope Blade Size:  1    Cormack-Lehane Classification:  Grade I - full view of glottis    Placement Verified by: capnometry, auscultation and equal breath sounds      Number of Attempts at Approach:  1    Number of Other Approaches Attempted:  0  FINAL AIRWAY DETAILS    Final Airway Type:  Endotracheal airway    Final Endotracheal Airway:  Oral RAE      Cuffed: cuffed    Insertion Site:  Oral    ETT Size (mm):  7.0    Cuff Volume (mL):  7  ----------------------------------------------------------------------------------------------------------------------------------------

## 2017-12-17 NOTE — Anesthesia Case Conclusion (Signed)
CASE CONCLUSION  Emergence  Actions:  Suctioned, OPA and extubated  Criteria Used for Airway Removal:  Adequate Tv & RR, acceptable O2 saturation, following commands and 5 sec head lift  Assessment:  Routine  Transport  Directly to: PACU  Airway:  Facemask  Oxygen Delivery:  4 lpm  Position:  Upright  Patient Condition on Handoff  Level of Consciousness:  Mildly sedated  Patient Condition:  Stable  Handoff Report to:  RN

## 2017-12-17 NOTE — Preop H&P (Signed)
UPDATES TO PATIENT'S CONDITION on the DAY OF SURGERY/PROCEDURE    I. Updates to Patient's Condition (to be completed by a provider privileged to complete a H&P, following reassessment of the patient by the provider):    Day of Surgery/Procedure Update:  History  History reviewed and no change.    Physical  Physical exam updated and no change.    II. Procedure Readiness   I have reviewed the patient's H&P and updated condition. By completing and signing this form, I attest that this patient is ready for surgery/procedure.      III. Attestation   I have reviewed the updated information regarding the patient's condition and it is appropriate to proceed with the planned surgery/procedure.    Jasen Hartstein-Xing Linzy Laury, MD as of 12/17/2017 10:29 AM.

## 2017-12-17 NOTE — INTERIM OP NOTE (Signed)
Interim Op Note (Surgical Log ID: [id number])       Date of Surgery: 12/17/2017       Surgeons: Surgeon(s) and Role:     * Man, Hampton AbbotLi Xing, MD - Primary     * Houston SirenLambert, Kaliann Coryell Alain, MD - Resident - Assisting       Pre-op Diagnosis: Pre-Op Diagnosis Codes:     * Recurrent acute sinusitis     * Nasal turbinate hypertrophy [J34.3]       Post-op Diagnosis: Post-Op Diagnosis Codes:     * Recurrent acute sinusitis     * Nasal turbinate hypertrophy [J34.3]       Procedure(s) Performed: Procedure(s) (LRB):  TURBINECTOMY  (Bilateral)  ENDOSCOPIC SINUS SURGERY PARTIAL ETHMOID, MAX, BALLOON FRONTAL (Bilateral)       Anesthesia Type: General        Fluid Totals: I/O this shift:  07/16 0700 - 07/16 1459  In: 1000 (12.7 mL/kg) [I.V.:1000]  Out: 200 (2.5 mL/kg) [Blood:200]  Net: 800  Weight: 78.5 kg        Estimated Blood Loss: 200 mL       Specimens to Pathology:  ID Type Source Tests Collected by Time Destination   A : left sinus contents TISSUE Tissue SURGICAL PATHOLOGY Man, Hampton AbbotLi Xing, MD 12/17/2017 1146    B : right sinus contents TISSUE Tissue SURGICAL PATHOLOGY Man, Hampton AbbotLi Xing, MD 12/17/2017 1232           Temporary Implants:        Packing:                 Patient Condition: good       Findings (Including unexpected complications): S/p bilateral anterior ethmoidectomy, maxillary antrostomy, frontal sinus balloon dilation     Signed:  Loa SocksPaul Toryn Dewalt, MD  on 12/17/2017 at 12:58 PM

## 2017-12-17 NOTE — Anesthesia Case Conclusion (Signed)
CASE CONCLUSION  Emergence  Actions:  Suctioned, soft bite block, OPA and extubated  Criteria Used for Airway Removal:  Adequate Tv & RR, acceptable O2 saturation and following commands  Assessment:  Routine  Transport  Directly to: PACU  Airway:  Facemask  Oxygen Delivery:  10 lpm  Monitoring:  Pulse oximetry  Position:  Upright  Patient Condition on Handoff  Level of Consciousness:  Mildly sedated  Patient Condition:  Stable  Handoff Report to:  RN

## 2017-12-17 NOTE — Anesthesia Postprocedure Evaluation (Signed)
Anesthesia Post-Op Note    Patient: Shari Harvey    Procedure(s) Performed:  Procedure Summary  Date:  12/17/2017 Anesthesia Start: 12/17/2017 10:50 AM Anesthesia Stop: 12/17/2017  1:04 PM Room / Location:  [id number]   Procedure(s):  TURBINECTOMY   ENDOSCOPIC SINUS SURGERY PARTIAL ETHMOID, MAX, BALLOON FRONTAL with IGS Diagnosis:  Chronic maxillary sinusitis [J32.0]  Nasal turbinate hypertrophy [J34.3] Surgeon(s):  Man, Hampton AbbotLi Xing, MD  Houston SirenLambert, Paul Alain, MD Attending Anesthesiologist:  Devin GoingGLOFF, Kerington Hildebrant, MD         Recovery Vitals  BP: 124/86 (12/17/2017  3:30 PM)  Heart Rate: 88 (12/17/2017  1:05 PM)  Heart Rate (via Pulse Ox): 82 (12/17/2017  3:30 PM)  Resp: 16 (12/17/2017  3:30 PM)  Temp: 36 C (96.8 F) (12/17/2017  3:30 PM)  SpO2: 94 % (12/17/2017  3:30 PM)  O2 Flow Rate: 8 L/min (12/17/2017  1:05 PM)   0-10 Scale: 4 (12/17/2017  3:30 PM)  Anesthesia type:  General  Complications Noted During Procedure or in PACU:  None   Comment:    Patient Location:  PACU  Level of Consciousness:    Awake and alert  Patient Participation:     Able to participate  Temperature Status:    Normothermic  Oxygen Saturation:    Appropriate for condition  Cardiac Status:   Appropriate for condition and stable  Fluid Status:    Euvolemic and stable  Airway Patency:     Yes  Pulmonary Status:    Stable and baseline  Pain Management:    Adequate analgesia  Nausea and Vomiting:  None    Post Op Assessment:    Tolerated procedure well and no evidence of recall"   Attending Attestation:  All indicated post anesthesia care provided  Comments:    Pain tolerable - only throat soreness (no swelling, eccyhmosis, or injury noted on exam).  All questions regarding anesthetic were welcomed and answered.  This note is being entered at a later time.       -

## 2017-12-18 ENCOUNTER — Encounter: Payer: Self-pay | Admitting: Otolaryngology

## 2017-12-20 ENCOUNTER — Telehealth: Payer: Self-pay | Admitting: Otolaryngology

## 2017-12-20 LAB — SURGICAL PATHOLOGY

## 2017-12-20 NOTE — Telephone Encounter (Signed)
Routing to APP pool & Dr. Lalla BrothersLambert in Dr. Donavan FoilMan's absence.

## 2017-12-20 NOTE — Telephone Encounter (Signed)
Pt is calling she had surgery on Tuesday. She is not having any pain from that but she is having neck and thoracic pain and wants to go to PT. The PT is wanting to make sure it is ok with us for her to go.    Please advise      Thanks.

## 2017-12-20 NOTE — Telephone Encounter (Signed)
Spoke to [employer] and relayed Kristina's advisement below. Patient verbalized understanding. The patient is requesting documentation be sent to her MyChart relaying this advisement. Letter sent to her MyChart.

## 2017-12-20 NOTE — Telephone Encounter (Signed)
No contraindication to have PT. This should not impede on typical post-op precautions.

## 2017-12-25 ENCOUNTER — Encounter: Payer: Self-pay | Admitting: Otolaryngology

## 2017-12-25 ENCOUNTER — Ambulatory Visit: Payer: PRIVATE HEALTH INSURANCE | Attending: Otolaryngology | Admitting: Otolaryngology

## 2017-12-25 VITALS — BP 123/79 | HR 76 | Ht 67.0 in | Wt 178.2 lb

## 2017-12-25 DIAGNOSIS — J343 Hypertrophy of nasal turbinates: Secondary | ICD-10-CM

## 2017-12-25 DIAGNOSIS — J0101 Acute recurrent maxillary sinusitis: Secondary | ICD-10-CM

## 2017-12-25 DIAGNOSIS — J0121 Acute recurrent ethmoidal sinusitis: Secondary | ICD-10-CM

## 2017-12-25 NOTE — Progress Notes (Signed)
Chief Complaint:   Post- Op bilateral partial ethmoidectomy, maxillary antrostomy, balloon dilation of frontal sinus ostium, submucous inferior turbinate reduction performed on 12/17/2017    HPI:   Shari Harvey is a 42 y.o. female who is in seen in follow up s/p above mentioned surgery. Indication for surgery was  .  CT evidence of turbinate hypertrophy bilaterally, mucosal thickening in the left > right maxillary sinus and some mild mucosal thickening in the ethmoid sinuses.causing headaches and nasal congestion. She denies any excessive bleeding, purulent drainage. She used saline irrigation 4-6 times the first 4 days but then had only been using it 2 times per day for the past 4 days. She reports that there has been minimal pain.        Review of Systems: Pertinent positives and negatives listed in HPI    Patient's medications, allergies, past medical, surgical, social and family histories were reviewed and updated as appropriate.    Current Medications:  Current Outpatient Prescriptions   Medication    BIOTIN PO    Multiple Vitamin (MULTIVITAMIN) capsule    calcium citrate-vitamin D (CALCIUM CITRATE-VITAMIN D) 315-250 MG-UNIT per tablet    desloratadine (CLARINEX) 5 MG tablet    Melatonin 5 MG CAPS    sertraline (ZOLOFT) 100 MG tablet    omeprazole (PRILOSEC) 20 MG capsule    montelukast (SINGULAIR) 10 MG tablet    oxyCODONE (ROXICODONE) 5 MG immediate release tablet    acetaminophen (TYLENOL) 500 mg tablet    senna-docusate (PERICOLACE) 8.6-50 MG per tablet    albuterol HFA (PROVENTIL HFA) 108 (90 Base) MCG/ACT inhaler    sertraline (ZOLOFT) 50 MG tablet    HYDROCORTISONE ACE, RECTAL, 30 MG SUPP    traMADol (ULTRAM) 50 MG tablet    SUMAtriptan (IMITREX) 100 MG tablet    beclomethasone (QVAR) 40 MCG/ACT inhaler    promethazine (PHENERGAN) 25 MG tablet    diphenhydrAMINE (BENADRYL) 50 MG tablet    acyclovir (ZOVIRAX) 400 MG tablet    clonazePAM (KLONOPIN) 0.5 MG tablet     No current  facility-administered medications for this visit.        Allergies:   Morphine; Adhesive tape; Avelox [moxifloxacin]; Environmental allergies; Flonase [fluticasone]; Shellfish-derived products; and Adhesive tape-silicones [other]      Objective:   BP 123/79    Pulse 76    Ht 1.702 m (5\' 7" )    Wt 80.8 kg (178 lb 3.2 oz)    BMI 27.91 kg/m   General:   healthy, alert, appears stated age, not in distress, no barriers to communication.   Head and Face:   no craniofacial deformities.   Nose:    Nasal membranes pink and moist.  Septum midline without significant deviation. Nose treated with tetracaine and phenylephrine for further evaluation.     Oral:   Posterior pharyngeal wall without drainage or erythema.         PROCEDURE: SINUS DEBRIDEMENT  Indication: Bilateral partial ethmoidectomy, maxillary antrostomy, balloon dilation of frontal sinus ostium, submucous inferior turbinate reduction performed on 12/17/2017  Provider: Letta MoynahanKristina Zane Pellecchia, NP  Anesthesia: topical tetracaine and phenylephrine    Findings: The 3.340mm 30 degree endoscope was used to examine the nasal cavity bilaterally.  Crusting, blood and NasoPore was aspirated from bilateral maxillary cavities, ethmoid cavities. On the right, the middle turbinate was somewhat lateralized and appeared was somewhat adherent to the lateral wall. The middle turbinate was medialized with the endoscope.  The sinus cavities were patent. The patient tolerated the procedure  well.      Assessment/Plan:   Shari Harvey is a 42 YO female s/p bilateral partial ethmoidectomy, maxillary antrostomy, balloon dilation of frontal sinus ostium, submucous inferior turbinate reduction performed on 12/17/2017     Debridement performed today.   Patient will start irrigating at least 4 times a day.    Follow-up with Dr. Man on Monday 12/30/2017.       Rosamaria Lints, NP as of 4:04 PM, 12/25/2017

## 2017-12-30 ENCOUNTER — Ambulatory Visit: Payer: PRIVATE HEALTH INSURANCE | Attending: Otolaryngology | Admitting: Otolaryngology

## 2017-12-30 ENCOUNTER — Encounter: Payer: Self-pay | Admitting: Otolaryngology

## 2017-12-30 VITALS — BP 119/69 | HR 73 | Ht 67.0 in | Wt 178.1 lb

## 2017-12-30 DIAGNOSIS — J0101 Acute recurrent maxillary sinusitis: Secondary | ICD-10-CM

## 2017-12-30 DIAGNOSIS — J0121 Acute recurrent ethmoidal sinusitis: Secondary | ICD-10-CM

## 2017-12-30 DIAGNOSIS — J343 Hypertrophy of nasal turbinates: Secondary | ICD-10-CM

## 2017-12-30 MED ORDER — OXYCODONE-ACETAMINOPHEN 5-325 MG PO TABS *I*
1.0000 | ORAL_TABLET | ORAL | Status: DC | PRN
Start: 2017-12-30 — End: 2017-12-30

## 2017-12-30 MED ORDER — OXYCODONE HCL 5 MG PO TABS *I*
5.0000 mg | ORAL_TABLET | ORAL | 0 refills | Status: AC | PRN
Start: 2017-12-30 — End: 2018-01-01

## 2017-12-30 NOTE — Procedures (Signed)
PROCEDURE  Bilateral nasal endoscopy with debridement.    INDICATIONS  CRS s/p ESS.    DESCRIPTION  Informed consent was obtained. The time-out was performed. Topical tetracaine and oxymetazoline were applied. Bilateral nasal endoscopy was performed with a 3 mm 30 degree rigid endoscope. The patient tolerated the procedure well without apparent complications.    Bilateral nasal endoscopy with limited debridement today. A small crust was removed from the left maxillary antrum. Some residual NasoPore was removed from the left ethmoid. The mucosa is still swollen but not atypical for less than 2 weeks post-op. On the right, mucus, crusts, and blood clots were removed from the middle meatus and anterior ethmoids as well as the maxillary antrum. A small amount of synechiae between the middle turbinate and lateral nasal wall was lysed using an oxymetazoline and tetracaine-soaked cottonoid. A small piece of Surgifoam was placed in between the middle turbinate and lateral nasal wall.

## 2017-12-30 NOTE — Progress Notes (Signed)
Reason for Visit  Second debridement after sinus surgery.    HPI  Shari Harvey is a 42 y.o. female who presents for a second debridement after bilateral maxillary and ethmoid sinus surgery as well as revision turbinate reduction and frontal balloon dilation on 12/17/2017. She reports no sinonasal pain after surgery until her first debridement last week. She reports significant pain for 24 hours. She still has right otalgia that migrates to her neck. She also stated she had shoulder and thoracic pain that occurred 24 hours after surgery that has now subsided. The Sino-Nasal Outcome Test (SNOT)-22 score on 12/30/2017 was 34 out 110, with higher scores indicating more severe symptoms.    Nasal Symptom Inventory  Facial or sinus pressure:  Mild   Facial or sinus pain:  None   Headache:  None   Nasal congestion:  Mild   Nasal obstruction:  None   Post-nasal drip:  Mild   Clear nasal discharge:   Mild   Discolored nasal discharge:  None   Itchy nose/eyes/throat:  None   Nose bleeds: None   Tiredness:  Moderate   Wheezing:  None   Cough:  None     To review, she was first seen in  March 2016, she reported decreased energy and 60 pound weight gain over the past year. She reported being on 5 antibiotics as well as oral steroids between August 2015 and March 2016. She was on Augmentin in August, October, and December 2015. In early 2016, she was on Avelox x 7 days (did nothing) and then Ceftin and Biaxin in conjunction with 3 weeks of Medrol. She had allergy testing with Dr. Berlin HunQuaidoo and was found to have sensitivities to dust mites, tree pollens, and rats ("very allergic ... had them as pets"). She had been previously allergic to dog and rabbit dander and had allergy immunotherapy as a child. She was treated with doxycylcine 03/25/15, Augmentin July 2018 for sinusitis and has had 4-5 episodes of sinusitis every year since 2014. She has been on immunotherapy every other week with Dr. Berlin HunQuaidoo since 2016. She re-presented in  June 2019 with continued recurrent episodes of sinusitis. Her sinusitis episodes consist of night sweats, body aches, head pain, worsened asthma that she feels is related to thick post nasal drip, worsened sense of smell and taste. She feels her allergies may precipitate more severe episodes. She estimates 5-6 episodes in the previous 5458-month period ending June 2019. The episodes last 2 weeks and she is often treated with antibiotics. She feels if she is not treated with antibiotics, her asthma worsens for a few weeks following the infection. Her allergy episodes are characterized by itchy eyes and nose, nasal congestion and clear nasal drainage. She was seen 11/06/2017 in our practice's urgent care clinic for clear drainage form the nose. A beta transferrin test was obtained and was negative. A CT scan was ordered which showed turbinate hypertrophy bilaterally, mucosal thickening in the left > right maxillary sinus and some mild mucosal thickening in the ethmoid sinuses. No signs of skull base defects. At follow-up less than 2 weeks later, she was no longer having clear rhinorrhea from the front of her nose when she bends over. She has no salty or metallic taste in her mouth. No fevers, chills or sweats. She reported that she read about CSF rhinorrhea when researching her symptoms. She was taking Clarinex, Zyrtec, montelukast, Qvar (orally inhaled), albuterol prn. She had not tolerated multiple intranasal steroid sprays due to nose bleeds. She does  not use nasal saline irrigations due to time restraints. She saw Dr. [name] (Neurology) in early June 2019 and was prescribed oral steroids for migraine. She feels her migraines are worse when she is having congestion, facial pressure, and drainage. When she takes Imitrex, her headache resolves but the maxillary pressure does not go away. She reported having had a septoplasty and turbinate reduction around the age of 65 and that his helped her be "healthy" for quite some  time. She has had her adenoids removed multiple times.    Allergies  She is allergic to morphine; adhesive tape; avelox [moxifloxacin]; environmental allergies; flonase [fluticasone]; shellfish-derived products; and adhesive tape-silicones [other].    PMH  She has a past medical history of Anxiety; Asthma; Complication of anesthesia; Ehlers-Danlos syndrome; Fatigue; GERD (gastroesophageal reflux disease); Herniated lumbar disc without myelopathy; History of rectal abscess; Hyperlipidemia; Hypermobility syndrome; Hypoglycemia; Insomnia; Irritable bowel syndrome; Migraines; Morbid obesity; Numbness and tingling; OSA on CPAP; Osteoarthritis; Rash; Shortness of breath; Stress incontinence; and Supraventricular arrhythmia.    PSH  She has a past surgical history that includes Tonsillectomy and adenoidectomy; Cesarean Section, Unspecified; lumbar laminectomy; PERIANAL; ANNAL FISSURE; Spine surgery; Cesarean section, classic (1610,9604); pr lap, gast restrict proc, longitudinal gastrectomy (N/A, 08/27/2016); gastrectomy; pr excision turbinate,submucous (Bilateral, 12/17/2017); and pr nasal/sinus ndsc w/partial ethmoidectomy (Bilateral, 12/17/2017). She had adenoidectomy in approximately 1982, 1992, and 1998. She had turbinate reduction and septoplasty in approximately 1998.    FH  Her family history includes Asthma in her father, sister, and sister; Cancer in her father, mother, and sister; Depression in her mother; Early death in her mother; High cholesterol in her father; Migraines in her sister and sister; Morbid Obesity in her mother; Obesity in her father; Osteoarthritis in her father, sister, and sister; Stroke in her mother; Thyroid disease in her sister and sister.    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 She  reports that she quit smoking about 14 years ago. She has never used smokeless tobacco. She reports that she currently engages in sexual activity and has had female  partners. She reports that she does not drink alcohol or use drugs.    ROS  Positive for items listed in HPI/NSI. Other organ systems negative. Reviewed and scanned into electronic medical record.    PE  Vitals BP 119/69    Pulse 73    Ht 1.702 m (5\' 7" )    Wt 80.8 kg (178 lb 2.1 oz) Comment: Previous weight   BMI 27.90 kg/m    General General appearance normal. Alert and cooperative. Voice normal.   Head/Face Normocephalic; atraumatic. No facial skin lesions. Facial strength symmetric.   Eyes Extraocular muscles intact. No nystagmus with lateral gaze.   Ears, Nose, Mouth & Throat Clinical speech thresholds normal. External ears normal. Septum is midline.    Neck No masses. Normal appearance for age.    Respiratory Normal respiratory effort. No retractions.   Cardiovascular Peripheral vascular system normal.   Lymphatic No palpable cervical adenopathy.   Neuro/Psych CNII-XII grossly normal. Alert and oriented. Mood and affect normal.     Results   CT sinus (08/18/2014, images reviewed with patient on 08/18/2014) - Mild mucosal thickening in the maxillary and ethmoid sinuses. Minimal mucosal thickening in the frontal and sphenoid sinuses. Small left Haller cell.    Culture (03/07/15), corynebacterium species resistant to cefazolin, erythromycin, methicillin, oxacillin, penicillin-G   CT sinus (11/08/17) images reviewed with patient 11/11/17 - Mild mucosal  thickening of right maxillary sinus, narrowing the maxillary os on that side, moderate mucosal thickening in the left maxillary sinus, minimal mucosal thickening of the bilateral ethmoid sinuses.    Assessment  1. Recurrent acute rhinosinusitis s/p bilateral mini-FESS and frontal balloon dilation (12/17/2017).  2. Allergic rhinitis.  3. Hypertrophy of inferior turbinates s/p revision turbinate reduction (12/17/2017).  4. Migraine.  5. Left facial pressure.  6. History of septoplasty, inferior turbinate reduction.    Plan   Bilateral nasal endoscopy with limited  debridement today. A small crust was removed from the left maxillary antrum. Some residual NasoPore was removed from the left ethmoid. The mucosa is still swollen but not atypical for less than 2 weeks post-op. On the right, mucus, crusts, and blood clots were removed from the middle meatus and anterior ethmoids as well as the maxillary antrum. A small amount of synechiae between the middle turbinate and lateral nasal wall was lysed using an oxymetazoline and tetracaine-soaked cottonoid. A small piece of Surgifoam was placed in between the middle turbinate and lateral nasal wall.   Hold irrigations for 36 hours, then continue irrigations 3x/day x 2 weeks, then 2x/day x 2 weeks, then daily until follow-up on 02/10/18 at 4:30 pm.   Five tablets of oxycodone 5 mg prescribed, as she reported significant pain after the topical anesthesia wore off after the first debridement.

## 2018-01-20 ENCOUNTER — Telehealth: Payer: Self-pay | Admitting: Otolaryngology

## 2018-01-20 NOTE — Telephone Encounter (Signed)
Patient had surgery on 7/16 with Dr Man. She says for the past few weeks she has been feeling dizzy and nauseous and is wondering if the rinse would be the reason why she is having these symptoms. She is asking if it would be ok to stop the rinse for a few days to see if she will feel  Better.

## 2018-01-21 NOTE — Telephone Encounter (Signed)
Man, Hampton AbbotLi Xing, MD  Jimmie Mollyswald, Enolia Koepke C, RN   Caller: Unspecified (Yesterday, 12:44 PM)            Saline irrigations would not typically cause headache and nausea, but she can hold them for a few days.      I spoke to patient and advised per Dr Man. She will also follow up with her Neurologist, as she does get migraines and feels that it may be related to that. She will call the office with any further questions or concerns.

## 2018-02-05 ENCOUNTER — Ambulatory Visit
Admission: AD | Admit: 2018-02-05 | Discharge: 2018-02-05 | Disposition: A | Discharge status: Home / Self Care | Payer: PRIVATE HEALTH INSURANCE | Source: Ambulatory Visit | Attending: Family Medicine | Admitting: Family Medicine

## 2018-02-05 ENCOUNTER — Other Ambulatory Visit
Admission: RE | Admit: 2018-02-05 | Discharge: 2018-02-05 | Disposition: A | Discharge status: Home / Self Care | Payer: PRIVATE HEALTH INSURANCE | Source: Ambulatory Visit | Attending: Obstetrics and Gynecology | Admitting: Obstetrics and Gynecology

## 2018-02-05 DIAGNOSIS — G43911 Migraine, unspecified, intractable, with status migrainosus: Secondary | ICD-10-CM | POA: Insufficient documentation

## 2018-02-05 DIAGNOSIS — E349 Endocrine disorder, unspecified: Secondary | ICD-10-CM | POA: Insufficient documentation

## 2018-02-05 LAB — TSH: TSH: 2.81 u[IU]/mL (ref 0.27–4.20)

## 2018-02-05 MED ORDER — KETOROLAC TROMETHAMINE 30 MG/ML IJ SOLN *I*
30.0000 mg | Freq: Once | INTRAMUSCULAR | Status: AC
Start: 2018-02-05 — End: 2018-02-05
  Administered 2018-02-05: 30 mg via INTRAMUSCULAR
  Filled 2018-02-05: qty 1

## 2018-02-05 MED ORDER — METHYLPREDNISOLONE SOD SUCC 125 MG IJ SOLR(62.5MG/ML) *WRAPPED*
INTRAMUSCULAR | Status: AC
Start: 2018-02-05 — End: 2018-02-05
  Filled 2018-02-05: qty 2

## 2018-02-05 MED ORDER — KETOROLAC TROMETHAMINE 30 MG/ML IJ SOLN *I*
INTRAMUSCULAR | Status: AC
Start: 2018-02-05 — End: 2018-02-05
  Filled 2018-02-05: qty 1

## 2018-02-05 MED ORDER — METHYLPREDNISOLONE SOD SUCC 125 MG IJ SOLR(62.5MG/ML) *WRAPPED*
62.5000 mg | Freq: Once | INTRAMUSCULAR | Status: AC
Start: 2018-02-05 — End: 2018-02-05
  Administered 2018-02-05: 62.5 mg via INTRAMUSCULAR
  Filled 2018-02-05: qty 1

## 2018-02-05 NOTE — UC Provider Note (Signed)
History     Chief Complaint   Patient presents with    Migraine     Was seen here July 3rd for migraine, toradol helped and migraine went away for a week or two. But for the last month or more has had a migraine after the sinus surgery at the end of July; botox x 3 rounds was done for migraines; saw neurologist last week and nothing is working. No fever or illness. Her physical therapist is on maternity leave and states that helps her lots. Has seen massage therapist 2 weeks ago. Has never tried acupuncture.            Medical/Surgical/Family History     Past Medical History:   Diagnosis Date    Anxiety     Asthma     managed by Dr. Nicholes Stairs    Complication of anesthesia     hypotension    Ehlers-Danlos syndrome     Fatigue     GERD (gastroesophageal reflux disease)     Herniated lumbar disc without myelopathy     History of rectal abscess     Hyperlipidemia     not being treated    Hypermobility syndrome     Hypoglycemia     Insomnia     Irritable bowel syndrome     Migraines     Morbid obesity     Numbness and tingling     OSA on CPAP     Osteoarthritis     Rash     under hanging skin    Shortness of breath     Stress incontinence     Supraventricular arrhythmia         Patient Active Problem List   Diagnosis Code    BMI 36.0-36.9,adult Z68.36    Morbid obesity E66.01    Asthma J45.909    GERD (gastroesophageal reflux disease) K21.9    Osteoarthritis M19.90    Sleep apnea G47.30    Hyperlipidemia E78.5    BMI 35.0-35.9,adult Z68.35    Bariatric surgery status Z98.84    BMI 31.0-31.9,adult Z68.31    BMI 31.0-31.9,adult Z68.31    BMI 31.0-31.9,adult Z68.31    BMI 28.0-28.9,adult Z68.28    Malabsorption K90.9    BMI 27.0-27.9,adult Z68.27    Recurrent acute sinusitis J01.91    Migraine G43.909    Hypertrophy of inferior nasal turbinate J34.3    Allergic rhinitis J30.9    Acute recurrent maxillary sinusitis J01.01    Acute recurrent ethmoidal sinusitis J01.21             Past Surgical History:   Procedure Laterality Date    ANNAL FISSURE      & Fistula septic    CESAREAN SECTION, CLASSIC  1610,9604    CESAREAN SECTION, UNSPECIFIED      GASTRECTOMY      LUMBAR LAMINECTOMY      PERIANAL      REMOVAL OF VENOUS THROMBOSIS    PR EXCISION TURBINATE,SUBMUCOUS Bilateral 12/17/2017    Procedure: TURBINECTOMY ;  Surgeon: Man, Hampton Abbot, MD;  Location: [facility];  Service: ENT    PR LAP, GAST RESTRICT PROC, LONGITUDINAL GASTRECTOMY N/A 08/27/2016    Procedure: LAPAROSCOPIC GASTRECTOMY SLEEVE;  Surgeon: Darrell Jewel, MD;  Location: [facility];  Service: General    PR NASAL/SINUS NDSC W/PARTIAL ETHMOIDECTOMY Bilateral 12/17/2017    Procedure: ENDOSCOPIC SINUS SURGERY PARTIAL ETHMOID, MAX, BALLOON FRONTAL with IGS;  Surgeon: Man, Hampton Abbot, MD;  Location: [facility];  Service: ENT    SPINE SURGERY      TONSILLECTOMY AND ADENOIDECTOMY       Family History   Problem Relation Age of Onset    Cancer Mother         glioblastoma    Depression Mother     Early death Mother     Morbid Obesity Mother     Stroke Mother     Osteoarthritis Father     Asthma Father     Cancer Father     High cholesterol Father     Obesity Father     Asthma Sister     Cancer Sister     Osteoarthritis Sister     Osteoarthritis Sister     Migraines Sister     Migraines Sister     Thyroid disease Sister     Thyroid disease Sister     Asthma Sister           Social History   Substance Use Topics    Smoking status: Former Smoker     Quit date: 06/05/2003    Smokeless tobacco: Never Used    Alcohol use No     Living Situation     Questions Responses    Patient lives with     Homeless     Caregiver for other family member     External Services     Employment     Domestic Violence Risk                 Review of Systems   Review of Systems   Constitutional: Negative for fever.   HENT: Negative.    Eyes: Positive for photophobia.   Respiratory: Negative.    Gastrointestinal: Positive for nausea.  Negative for vomiting.   Neurological: Positive for headaches. Negative for seizures and speech difficulty.   All other systems reviewed and are negative.      Physical Exam   Triage Vitals      First Recorded BP: 115/74, Resp: 16, Temp: 36.1 C (97 F) Oxygen Therapy SpO2: 100 %, Oximetry Source: Rt Hand, O2 Device: None (Room air), Heart Rate: 59, (02/05/18 1109) Heart Rate (via Pulse Ox): 59, (02/05/18 1109).      Physical Exam   Constitutional: She is oriented to person, place, and time. She appears well-developed and well-nourished.   Looks well   Eyes: Pupils are equal, round, and reactive to light. EOM are normal.   Neck: Normal range of motion. Neck supple.   Neurological: She is oriented to person, place, and time. No cranial nerve deficit or sensory deficit. Coordination normal.   Skin: Skin is warm and dry.   Psychiatric: She has a normal mood and affect. Her behavior is normal. Judgment and thought content normal.   Nursing note and vitals reviewed.       Medical Decision Making      -Pt states the toradol helps and has had solumedrol before; would like both. Flonase listed for an allergy but it pt states the flonase only causes irritation in the nose, no true allergy.    -pain was a 4-5/ 10 prior to meds and reassess at 11:50 pain decreased 1/10. Feels the solumedrol really helped.    Initial Evaluation:  ED First Provider Contact     Date/Time Event User Comments    02/05/18 1059 ED First Provider Contact Romana Juniper, Maddox Hlavaty Initial Face to Face Provider Contact          Patient  was seen on: 02/05/2018        Assessment:  42 y.o.female [facility] with Was seen here July 3rd for migraine, toradol helped and migraine went away for a week or two. But for the last month or more has had a migraine after the sinus surgery at the end of July; botox x 3 rounds was done for migraines; saw neurologist last week and nothing is working. No fever or illness. Her physical therapist is on maternity  leave and states that helps her lots. Has seen massage therapist 2 weeks ago. Has never tried acupuncture.    Differential Diagnosis includes:  Headache  migraine    Plan:   F/u with pcp, neurologist, and your physical therapist.  Rest in a dark room  Drink lots of water.  Use your usual migraine medication.  Consider acupuncture and massage therapy.       Final Diagnosis  Final diagnoses:   [G43.911] Intractable migraine with status migrainosus, unspecified migraine type (Primary)         Crescentia Boutwell Blanchie Dessert, NP       I have reviewed nursing documentation, confirmed information with patient, and revised with nursing as necessary.       Marylen Ponto, NP  02/05/18 1153

## 2018-02-05 NOTE — Discharge Instructions (Signed)
F/u with pcp, neurologist, and your physical therapist.  Rest in a dark room  Drink lots of water.  Use your usual migraine medication.  Consider acupuncture and massage therapy.

## 2018-02-05 NOTE — ED Triage Notes (Signed)
Has had 'migraine for a month, seen her PCP and neurologist and they "aren't doing anything". Said she hadn't tried [facility] in a while so she thought she would try it. Asked if she been here before for it, she said yes, about a month ago when migraine started, got a shot of Toradol here, and trying to get another one today as had this migraine continuously every day fro a month.       Triage Note   Shari Iba, LPN

## 2018-02-10 ENCOUNTER — Ambulatory Visit: Payer: PRIVATE HEALTH INSURANCE | Attending: Otolaryngology | Admitting: Otolaryngology

## 2018-02-10 ENCOUNTER — Encounter: Payer: Self-pay | Admitting: Otolaryngology

## 2018-02-10 VITALS — BP 120/76 | HR 80 | Ht 67.0 in | Wt 179.8 lb

## 2018-02-10 DIAGNOSIS — J0101 Acute recurrent maxillary sinusitis: Secondary | ICD-10-CM

## 2018-02-10 DIAGNOSIS — J0121 Acute recurrent ethmoidal sinusitis: Secondary | ICD-10-CM

## 2018-02-10 DIAGNOSIS — G43909 Migraine, unspecified, not intractable, without status migrainosus: Secondary | ICD-10-CM

## 2018-02-10 DIAGNOSIS — J343 Hypertrophy of nasal turbinates: Secondary | ICD-10-CM

## 2018-02-10 NOTE — Progress Notes (Signed)
Reason for Visit  Dizziness, feeling stuffy.    HPI  Shari Harvey is a 42 y.o. female who presents for a second debridement after bilateral maxillary and ethmoid sinus surgery as well as revision turbinate reduction and frontal balloon dilation on 12/17/2017. She has been having different migraines, vestibular migraines. She will have increase in sinus congestion during these episodes. Imitrex resolves the symptoms, but she is currently taking sumatriptan 100 mg on a daily or almost daily basis. She has scheduled follow-up with Dr. [name]. She was seen at [facility] on 02/06/18 for migraine. She does note that her nasal symptoms have improved since the surgery in July 2019. The Sino-Nasal Outcome Test (SNOT)-22 score on 02/10/2018 was 23 out 110, with higher scores indicating more severe symptoms.    Nasal Symptom Inventory  Facial or sinus pressure:  Moderate   Facial or sinus pain:  Mild   Headache:  Moderate   Nasal congestion:  Mild   Nasal obstruction:  None   Post-nasal drip:  Mild   Clear nasal discharge:   None   Discolored nasal discharge:  None   Itchy nose/eyes/throat:  Mild   Nose bleeds: None   Tiredness:  Mild   Wheezing:  None   Cough:  None     To review, she was first seen in  March 2016, she reported decreased energy and 60 pound weight gain over the past year. She reported being on 5 antibiotics as well as oral steroids between August 2015 and March 2016. She was on Augmentin in August, October, and December 2015. In early 2016, she was on Avelox x 7 days (did nothing) and then Ceftin and Biaxin in conjunction with 3 weeks of Medrol. She had allergy testing with Dr. Berlin Hun and was found to have sensitivities to dust mites, tree pollens, and rats ("very allergic ... had them as pets"). She had been previously allergic to dog and rabbit dander and had allergy immunotherapy as a child. She was treated with doxycylcine 03/25/15, Augmentin July 2018 for sinusitis and has had 4-5 episodes of  sinusitis every year since 2014. She has been on immunotherapy every other week with Dr. Berlin Hun since 2016. She re-presented in June 2019 with continued recurrent episodes of sinusitis. Her sinusitis episodes consist of night sweats, body aches, head pain, worsened asthma that she feels is related to thick post nasal drip, worsened sense of smell and taste. She feels her allergies may precipitate more severe episodes. She estimates 5-6 episodes in the previous 65-month period ending June 2019. The episodes last 2 weeks and she is often treated with antibiotics. She feels if she is not treated with antibiotics, her asthma worsens for a few weeks following the infection. Her allergy episodes are characterized by itchy eyes and nose, nasal congestion and clear nasal drainage. She was seen 11/06/2017 in our practice's urgent care clinic for clear drainage form the nose. A beta transferrin test was obtained and was negative. A CT scan was ordered which showed turbinate hypertrophy bilaterally, mucosal thickening in the left > right maxillary sinus and some mild mucosal thickening in the ethmoid sinuses. No signs of skull base defects. At follow-up less than 2 weeks later, she was no longer having clear rhinorrhea from the front of her nose when she bends over. She has no salty or metallic taste in her mouth. No fevers, chills or sweats. She reported that she read about CSF rhinorrhea when researching her symptoms. She was taking Clarinex, Zyrtec,  montelukast, Qvar (orally inhaled), albuterol prn. She had not tolerated multiple intranasal steroid sprays due to nose bleeds. She does not use nasal saline irrigations due to time restraints. She saw Dr. [name] (Neurology) in early June 2019 and was prescribed oral steroids for migraine. She feels her migraines are worse when she is having congestion, facial pressure, and drainage. When she takes Imitrex, her headache resolves but the maxillary pressure does not go away. She  reported having had a septoplasty and turbinate reduction around the age of 84 and that his helped her be "healthy" for quite some time. She has had her adenoids removed multiple times.    Allergies  She is allergic to morphine; adhesive tape; avelox [moxifloxacin]; environmental allergies; flonase [fluticasone]; shellfish-derived products; and adhesive tape-silicones [other].    PMH  She has a past medical history of Anxiety; Asthma; Complication of anesthesia; Ehlers-Danlos syndrome; Fatigue; GERD (gastroesophageal reflux disease); Herniated lumbar disc without myelopathy; History of rectal abscess; Hyperlipidemia; Hypermobility syndrome; Hypoglycemia; Insomnia; Irritable bowel syndrome; Migraines; Morbid obesity; Numbness and tingling; OSA on CPAP; Osteoarthritis; Rash; Shortness of breath; Stress incontinence; and Supraventricular arrhythmia.    PSH  She has a past surgical history that includes Tonsillectomy and adenoidectomy; Cesarean Section, Unspecified; lumbar laminectomy; PERIANAL; ANNAL FISSURE; Spine surgery; Cesarean section, classic ([id number]); pr lap, gast restrict proc, longitudinal gastrectomy (N/A, 08/27/2016); gastrectomy; pr excision turbinate,submucous (Bilateral, 12/17/2017); and pr nasal/sinus ndsc w/partial ethmoidectomy (Bilateral, 12/17/2017). She had adenoidectomy in approximately 1982, 1992, and 1998. She had turbinate reduction and septoplasty in approximately 1998.    FH  Her family history includes Asthma in her father, sister, and sister; Cancer in her father, mother, and sister; Depression in her mother; Early death in her mother; High cholesterol in her father; Migraines in her sister and sister; Morbid Obesity in her mother; Obesity in her father; Osteoarthritis in her father, sister, and sister; Stroke in her mother; Thyroid disease in her sister and sister.    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 She  reports that she quit smoking  about 14 years ago. She has never used smokeless tobacco. She reports that she currently engages in sexual activity and has had female partners. She reports that she does not drink alcohol or use drugs.    ROS  Positive for items listed in HPI/NSI. Other organ systems negative. Reviewed and scanned into electronic medical record.    PE  Vitals BP 120/76    Pulse 80    Ht 1.702 m (5\' 7" )    Wt 81.6 kg (179 lb 12.8 oz)    BMI 28.16 kg/m    General General appearance normal. Alert and cooperative. Voice normal.   Head/Face Normocephalic; atraumatic. No facial skin lesions. Facial strength symmetric.   Eyes Extraocular muscles intact. No nystagmus with lateral gaze.   Ears, Nose, Mouth & Throat Clinical speech thresholds normal. External ears normal. Septum is midline.    Neck No masses. Normal appearance for age.    Respiratory Normal respiratory effort. No retractions.   Cardiovascular Peripheral vascular system normal.   Lymphatic No palpable cervical adenopathy.   Neuro/Psych CNII-XII grossly normal. Alert and oriented. Mood and affect normal.     Results   CT sinus (08/18/2014, images reviewed with patient on 08/18/2014) - Mild mucosal thickening in the maxillary and ethmoid sinuses. Minimal mucosal thickening in the frontal and sphenoid sinuses. Small left Haller cell.    Culture (03/07/15), corynebacterium species resistant to  cefazolin, erythromycin, methicillin, oxacillin, penicillin-G   CT sinus (11/08/17) images reviewed with patient 11/11/17 - Mild mucosal thickening of right maxillary sinus, narrowing the maxillary os on that side, moderate mucosal thickening in the left maxillary sinus, minimal mucosal thickening of the bilateral ethmoid sinuses.    Assessment  1. Recurrent acute rhinosinusitis s/p bilateral mini-FESS and frontal balloon dilation (12/17/2017).  2. Allergic rhinitis.  3. Hypertrophy of inferior turbinates s/p revision turbinate reduction (12/17/2017).  4. Migraine.  5. Left facial  pressure.  6. History of septoplasty, inferior turbinate reduction.    Plan   Bilateral nasal endoscopy today. The mucosa is generally well-healed. There is still some mild granulation evident between the maxillary and ethmoid sinuses on the left. The mucosa is thin. Can see widely into the maxillary and ethmoid sinuses.   I am pleased with the surgical result.   May use saline irrigations on an as-needed basis.   Continue to work with Neurology on migraine.   We discussed scheduled or PRN follow-up. I offered a scheduled follow-up time/date. She prefers to follow-up PRN, but prefers to follow-up with our office for sinusitis in the future. Certainly she may be seen in our urgent care clinic if needed. She has seen APPs Tarek Siala, Justine [place], [place], and [place] over the last several years.

## 2018-02-21 ENCOUNTER — Ambulatory Visit
Admission: AD | Admit: 2018-02-21 | Discharge: 2018-02-21 | Disposition: A | Discharge status: Home / Self Care | Payer: PRIVATE HEALTH INSURANCE | Source: Ambulatory Visit | Attending: Emergency Medicine | Admitting: Emergency Medicine

## 2018-02-21 DIAGNOSIS — G43909 Migraine, unspecified, not intractable, without status migrainosus: Secondary | ICD-10-CM | POA: Insufficient documentation

## 2018-02-21 MED ORDER — KETOROLAC TROMETHAMINE 30 MG/ML IJ SOLN *I*
INTRAMUSCULAR | Status: AC
Start: 2018-02-21 — End: 2018-02-21
  Filled 2018-02-21: qty 1

## 2018-02-21 MED ORDER — KETOROLAC TROMETHAMINE 30 MG/ML IJ SOLN *I*
30.0000 mg | Freq: Once | INTRAMUSCULAR | Status: AC
Start: 2018-02-21 — End: 2018-02-21
  Administered 2018-02-21: 30 mg via INTRAMUSCULAR
  Filled 2018-02-21: qty 1

## 2018-02-21 MED ORDER — METHYLPREDNISOLONE SOD SUCC 125 MG IJ SOLR(62.5MG/ML) *WRAPPED*
125.0000 mg | Freq: Once | INTRAMUSCULAR | Status: AC
Start: 2018-02-21 — End: 2018-02-21
  Administered 2018-02-21: 125 mg via INTRAMUSCULAR
  Filled 2018-02-21: qty 2

## 2018-02-21 MED ORDER — METHYLPREDNISOLONE SOD SUCC 125 MG IJ SOLR(62.5MG/ML) *WRAPPED*
INTRAMUSCULAR | Status: AC
Start: 2018-02-21 — End: 2018-02-21
  Filled 2018-02-21: qty 2

## 2018-02-21 NOTE — Discharge Instructions (Signed)
Seek emergency care if you have trouble or changes with your speech, facial or arm symmetry.

## 2018-02-21 NOTE — UC Provider Note (Signed)
History     Chief Complaint   Patient presents with    Migraine     x 2 days has maitnese medication with botox, but is pre menapause and has taken imatrex with no effect.     HPI   Patient reports has a chronic history of migraines, started with one two days ago, normally takes the Imitrex and has the migraine for 1-2 days then it resolves. Insurance will only cover a certain amount of tablets of the Imitrex. Ran out 3-4 days ago. Migraine on right side behind eye. Follows neurosurgery for migraines stared Botox treatment, received 3rd round worked prior to starting perimenopause. Unable to take oral NSAIDs due to gastric surgery. Reports received Toradol and steriods previously with great relief. Mild nausea and sensitive to light and smells. Denies known aura, fever, chills.             Medical/Surgical/Family History     Past Medical History:   Diagnosis Date    Anxiety     Asthma     managed by Dr. Nicholes Stairs    Complication of anesthesia     hypotension    Ehlers-Danlos syndrome     Fatigue     GERD (gastroesophageal reflux disease)     Herniated lumbar disc without myelopathy     History of rectal abscess     Hyperlipidemia     not being treated    Hypermobility syndrome     Hypoglycemia     Insomnia     Irritable bowel syndrome     Migraines     Morbid obesity     Numbness and tingling     OSA on CPAP     Osteoarthritis     Rash     under hanging skin    Shortness of breath     Stress incontinence     Supraventricular arrhythmia         Patient Active Problem List   Diagnosis Code    BMI 36.0-36.9,adult Z68.36    Morbid obesity E66.01    Asthma J45.909    GERD (gastroesophageal reflux disease) K21.9    Osteoarthritis M19.90    Sleep apnea G47.30    Hyperlipidemia E78.5    BMI 35.0-35.9,adult Z68.35    Bariatric surgery status Z98.84    BMI 31.0-31.9,adult Z68.31    BMI 31.0-31.9,adult Z68.31    BMI 31.0-31.9,adult Z68.31    BMI 28.0-28.9,adult Z68.28    Malabsorption  K90.9    BMI 27.0-27.9,adult Z68.27    Recurrent acute sinusitis J01.91    Migraine G43.909    Hypertrophy of inferior nasal turbinate J34.3    Allergic rhinitis J30.9    Acute recurrent maxillary sinusitis J01.01    Acute recurrent ethmoidal sinusitis J01.21            Past Surgical History:   Procedure Laterality Date    ANNAL FISSURE      & Fistula septic    CESAREAN SECTION, CLASSIC  1610,9604    CESAREAN SECTION, UNSPECIFIED      GASTRECTOMY      LUMBAR LAMINECTOMY      PERIANAL      REMOVAL OF VENOUS THROMBOSIS    PR EXCISION TURBINATE,SUBMUCOUS Bilateral 12/17/2017    Procedure: TURBINECTOMY ;  Surgeon: Man, Hampton Abbot, MD;  Location: [facility];  Service: ENT    PR LAP, GAST RESTRICT PROC, LONGITUDINAL GASTRECTOMY N/A 08/27/2016    Procedure: LAPAROSCOPIC GASTRECTOMY SLEEVE;  Surgeon: Darrell Jewel, MD;  Location: [facility];  Service: General    PR NASAL/SINUS NDSC W/PARTIAL ETHMOIDECTOMY Bilateral 12/17/2017    Procedure: ENDOSCOPIC SINUS SURGERY PARTIAL ETHMOID, MAX, BALLOON FRONTAL with IGS;  Surgeon: Man, Hampton Abbot, MD;  Location: [facility];  Service: ENT    SPINE SURGERY      TONSILLECTOMY AND ADENOIDECTOMY       Family History   Problem Relation Age of Onset    Cancer Mother         glioblastoma    Depression Mother     Early death Mother     Morbid Obesity Mother     Stroke Mother     Osteoarthritis Father     Asthma Father     Cancer Father     High cholesterol Father     Obesity Father     Asthma Sister     Cancer Sister     Osteoarthritis Sister     Osteoarthritis Sister     Migraines Sister     Migraines Sister     Thyroid disease Sister     Thyroid disease Sister     Asthma Sister           Social History   Substance Use Topics    Smoking status: Former Smoker     Quit date: 06/05/2003    Smokeless tobacco: Never Used    Alcohol use No     Living Situation     Questions Responses    Patient lives with     Homeless     Caregiver for other family member      External Services     Employment     Domestic Violence Risk                 Review of Systems   Review of Systems    Physical Exam   Triage Vitals  Triage Start: Start, (02/21/18 1310)   First Recorded BP: 108/68, Resp: 18, Temp: 36.4 C (97.5 F), Temp src: TEMPORAL Oxygen Therapy SpO2: 99 %, Oximetry Source: Rt Hand, O2 Device: None (Room air), Heart Rate: 66, (02/21/18 1312)  .  First Pain Reported  0-10 Scale: 5, Pain Location/Orientation: Head, (02/21/18 1312)       Physical Exam   Constitutional: She is oriented to person, place, and time. She appears well-developed and well-nourished.   HENT:   Head:       Right Ear: Tympanic membrane normal.   Left Ear: Tympanic membrane normal.   Mouth/Throat: Oropharynx is clear and moist and mucous membranes are normal.   Eyes: Pupils are equal, round, and reactive to light. EOM are normal.   Neck: Normal range of motion. Neck supple.   Cardiovascular: Normal rate.    Pulmonary/Chest: Effort normal and breath sounds normal.   Abdominal: Soft. There is no tenderness.   Musculoskeletal: Normal range of motion.   Neurological: She is alert and oriented to person, place, and time. She has normal strength. No cranial nerve deficit or sensory deficit.   Skin: Capillary refill takes less than 2 seconds.   Psychiatric: She has a normal mood and affect.   Nursing note and vitals reviewed.       Medical Decision Making        Initial Evaluation:  ED First Provider Contact     Date/Time Event User Comments    02/21/18 1304 ED First Provider Contact HUITT, Mckyla Deckman S Initial Face to Face Provider Contact  Patient was seen on: 02/21/2018        Assessment:  42 y.o.female comes to the Urgent Care Center with migraine.     Differential Diagnosis includes:  Migraine  Headache  Sinusitis     Plan:   Seek emergency care if you have trouble or changes with your speech, facial or arm symmetry.       ED Course as of Feb 21 1399   Fri Feb 21, 2018   1359 Mild improvement of symptoms.          Final Diagnosis  Final diagnoses:   [G43.909] Migraine syndrome (Primary)         Rickard PatienceSarai Huitt, NP       I have reviewed nursing documentation, confirmed information with patient, and revised with nursing as necessary.       Huitt, Louisa Favaro, NP  02/21/18 1400

## 2019-03-06 DIAGNOSIS — Q796 Ehlers-Danlos syndrome, unspecified: Secondary | ICD-10-CM | POA: Insufficient documentation

## 2019-03-06 DIAGNOSIS — G43009 Migraine without aura, not intractable, without status migrainosus: Secondary | ICD-10-CM | POA: Insufficient documentation

## 2019-03-06 DIAGNOSIS — G8929 Other chronic pain: Secondary | ICD-10-CM | POA: Insufficient documentation

## 2019-03-14 ENCOUNTER — Telehealth: Payer: Self-pay | Admitting: Surgery

## 2019-03-14 NOTE — Telephone Encounter (Signed)
MBS[employer] left message with clinic phone number encouraging patient to make follow-up appt.  Letter sent.

## 2019-11-03 DIAGNOSIS — G90A Postural orthostatic tachycardia syndrome (POTS): Secondary | ICD-10-CM | POA: Insufficient documentation

## 2019-11-03 DIAGNOSIS — K589 Irritable bowel syndrome without diarrhea: Secondary | ICD-10-CM | POA: Insufficient documentation

## 2019-11-03 DIAGNOSIS — I498 Other specified cardiac arrhythmias: Secondary | ICD-10-CM | POA: Insufficient documentation

## 2019-11-03 DIAGNOSIS — K219 Gastro-esophageal reflux disease without esophagitis: Secondary | ICD-10-CM | POA: Insufficient documentation

## 2019-11-06 DIAGNOSIS — Z9884 Bariatric surgery status: Secondary | ICD-10-CM | POA: Insufficient documentation

## 2019-12-07 ENCOUNTER — Ambulatory Visit: Payer: Self-pay

## 2019-12-21 ENCOUNTER — Other Ambulatory Visit: Payer: Self-pay

## 2019-12-21 ENCOUNTER — Encounter: Payer: Self-pay | Admitting: Allergy and Immunology

## 2019-12-21 ENCOUNTER — Ambulatory Visit (INDEPENDENT_AMBULATORY_CARE_PROVIDER_SITE_OTHER): Payer: 59 | Admitting: Allergy and Immunology

## 2019-12-21 VITALS — BP 110/78 | HR 87 | Temp 98.3°F | Resp 16 | Ht 67.01 in | Wt 193.0 lb

## 2019-12-21 DIAGNOSIS — J4520 Mild intermittent asthma, uncomplicated: Secondary | ICD-10-CM | POA: Insufficient documentation

## 2019-12-21 DIAGNOSIS — J4530 Mild persistent asthma, uncomplicated: Secondary | ICD-10-CM | POA: Diagnosis not present

## 2019-12-21 DIAGNOSIS — Z87898 Personal history of other specified conditions: Secondary | ICD-10-CM | POA: Diagnosis not present

## 2019-12-21 DIAGNOSIS — J3089 Other allergic rhinitis: Secondary | ICD-10-CM | POA: Diagnosis not present

## 2019-12-21 NOTE — Assessment & Plan Note (Signed)
   Avoid nasal steroid sprays.  Nasal saline spray/gel if needed.

## 2019-12-21 NOTE — Assessment & Plan Note (Signed)
Stable.  Continue montelukast 10 mg daily at bedtime.  Continue albuterol HFA, 1 to 2 inhalations every 4-6 hours if needed.  Subjective and objective measures of pulmonary function will be followed and the treatment plan will be adjusted accordingly.

## 2019-12-21 NOTE — Assessment & Plan Note (Signed)
   The patient will resume aeroallergen immunotherapy under our care using the allergen vaccines and protocol provided by her previous allergist. She has agreed to have access to epinephrine autoinjector.  For now, continue appropriate allergen avoidance measures as well as antihistamine as needed.  Return for follow-up in 4 months, or sooner if necessary.

## 2019-12-21 NOTE — Progress Notes (Signed)
New Patient Note  RE: Renee Velasquez MRN: [id number] DOB: 02/03/1976 Date of Office Visit: 12/21/2019  Referring provider: No ref. provider found Primary care provider: Salem Senate, MD  Chief Complaint: Allergic Rhinitis  and Asthma   History of present illness: Renee Velasquez is a 44 y.o. female presenting today for evaluation of allergic rhinoconjunctivitis.  She moved from [place] to [place] over this past year.  She started immunotherapy injections approximately 6 months ago in [facility].  She has been progressing through buildup injections without problems or complications.  Prior to moving to [place] she had been on immunotherapy injections in [place] for approximately 5 or 6 years.  She has noticed improvement in her allergy symptoms.  She had sinus surgery in 2019 with benefit.  She currently takes montelukast 10 mg daily at bedtime and an antihistamine, either does loratadine or cetirizine, as needed.  She does not use nasal sprays because of a history of epistaxis. While taking montelukast 10 mg daily at bedtime, her asthma has been well controlled.  She rarely requires albuterol rescue and denies limitations in normal daily activities or nocturnal awakenings due to lower respiratory symptoms.  Her asthma is typically triggered by strong aromas and/or sinus infections.  Assessment and plan: Allergic rhinitis  The patient will resume aeroallergen immunotherapy under our care using the allergen vaccines and protocol provided by her previous allergist. She has agreed to have access to epinephrine autoinjector.  For now, continue appropriate allergen avoidance measures as well as antihistamine as needed.  Return for follow-up in 4 months, or sooner if necessary.  Mild persistent asthma Stable.  Continue montelukast 10 mg daily at bedtime.  Continue albuterol HFA, 1 to 2 inhalations every 4-6 hours if needed.  Subjective and  objective measures of pulmonary function will be followed and the treatment plan will be adjusted accordingly.  History of epistaxis  Avoid nasal steroid sprays.  Nasal saline spray/gel if needed.   Diagnostics: Spirometry:  Normal with an FEV1 of 103% predicted. This study was performed while the patient was asymptomatic.  Please see scanned spirometry results for details.    Physical examination: Blood pressure 110/78, pulse 87, temperature 98.3 F (36.8 C), temperature source Oral, resp. rate 16, height 5' 7.01" (1.702 m), weight 193 lb (87.5 kg), SpO2 99 %.  General: Alert, interactive, in no acute distress. HEENT: TMs pearly gray, turbinates mildly edematous without discharge, post-pharynx mildly erythematous. Neck: Supple without lymphadenopathy. Lungs: Clear to auscultation without wheezing, rhonchi or rales. CV: Normal S1, S2 without murmurs. Abdomen: Nondistended, nontender. Skin: Warm and dry, without lesions or rashes. Extremities:  No clubbing, cyanosis or edema. Neuro:   Grossly intact.  Review of systems:  Review of systems negative except as noted in HPI / PMHx or noted below: Review of Systems  Constitutional: Negative.   HENT: Negative.   Eyes: Negative.   Respiratory: Negative.   Cardiovascular: Negative.   Gastrointestinal: Negative.   Genitourinary: Negative.   Musculoskeletal: Negative.   Skin: Negative.   Neurological: Negative.   Endo/Heme/Allergies: Negative.   Psychiatric/Behavioral: Negative.     Past medical history:  Past Medical History:  Diagnosis Date  . Achilles tendonitis   . Allodynia   . Anxiety   . Arthritis   . Asthma   . Brachial plexopathy   . CFS (chronic fatigue syndrome)   . Chronic joint pain   . Dizziness   . Dysfunctional autonomic nervous system   .  Eczema   . Ehlers-Danlos syndrome   . GERD (gastroesophageal reflux disease)   . Hernia, hiatal   . Hypoglycemia   . Hypotension   . IBS (irritable bowel  syndrome)   . IBS (irritable bowel syndrome)   . ME (myalgic encephalomyelitis)   . Migraines   . Nerve pain   . Obesity   . PMDD (premenstrual dysphoric disorder)   . Recurrent upper respiratory infection (URI)   . SVT (supraventricular tachycardia) ([place])   . Urticaria   . Vestibular migraine     Past surgical history:  Past Surgical History:  Procedure Laterality Date  . ADENOIDECTOMY    . CESAREAN SECTION  2008 and 20010  . GASTRECTOMY  08/2016  . HEMORROIDECTOMY    . INCISION AND DRAINAGE ABSCESS ANAL  04/2002, 02/1999 and 9/20011  . lumber laminectomy  02/2010  . SINOSCOPY  1998 and 2019  . TONSILLECTOMY    . TREATMENT FISTULA ANAL      Family history: Family History  Problem Relation Age of Onset  . Allergic rhinitis Mother   . Asthma Mother   . Eczema Mother   . Urticaria Mother   . Allergic rhinitis Father   . Asthma Father   . Eczema Father   . Urticaria Father   . Allergic rhinitis Sister   . Asthma Sister   . Eczema Sister   . Urticaria Sister     Social history: Social History   Socioeconomic History  . Marital status: Married    Spouse name: Not on file  . Number of children: Not on file  . Years of education: Not on file  . Highest education level: Not on file  Occupational History  . Not on file  Tobacco Use  . Smoking status: Former Research scientist (life sciences)  . Smokeless tobacco: Never Used  Vaping Use  . Vaping Use: Never used  Substance and Sexual Activity  . Alcohol use: Not on file  . Drug use: Not on file  . Sexual activity: Not on file  Other Topics Concern  . Not on file  Social History Narrative  . Not on file   Social Determinants of Health   Financial Resource Strain:   . Difficulty of Paying Living Expenses:   Food Insecurity:   . Worried About Charity fundraiser in the Last Year:   . Arboriculturist in the Last Year:   Transportation Needs:   . Film/video editor (Medical):   Marland Kitchen Lack of Transportation (Non-Medical):   Physical  Activity:   . Days of Exercise per Week:   . Minutes of Exercise per Session:   Stress:   . Feeling of Stress :   Social Connections:   . Frequency of Communication with Friends and Family:   . Frequency of Social Gatherings with Friends and Family:   . Attends Religious Services:   . Active Member of Clubs or Organizations:   . Attends Archivist Meetings:   Marland Kitchen Marital Status:   Intimate Partner Violence:   . Fear of Current or Ex-Partner:   . Emotionally Abused:   Marland Kitchen Physically Abused:   . Sexually Abused:     Environmental History: The patient lives in a 44 year old house with laminate floors throughout and central air/heat.  There is no known mold/water damage in the home.  There are 2 dogs in the home which have access to her bedroom.  She is a former cigarette smoker having started 12 and quit in  2005.  During that time she smoked socially, 1 pack/week on average.  Current Outpatient Medications  Medication Sig Dispense Refill  . acetaminophen (TYLENOL) 500 MG tablet Take by mouth daily.    Marland Kitchen acyclovir (ZOVIRAX) 400 MG tablet Take by mouth as needed.    Marland Kitchen albuterol (VENTOLIN HFA) 108 (90 Base) MCG/ACT inhaler Inhale into the lungs.    Marland Kitchen CALCIUM CITRATE-VITAMIN D3 PO Take by mouth daily.    . cetirizine (ZYRTEC) 10 MG tablet Take 10 mg by mouth as needed for allergies.    Marland Kitchen CLONAZEPAM PO Take 5 mg by mouth 3 (three) times daily.    . cyclobenzaprine (FLEXERIL) 5 MG tablet Take 5 mg by mouth 3 (three) times daily as needed.    . desloratadine (CLARINEX) 5 MG tablet Take by mouth.    . diphenhydrAMINE HCl (BENADRYL ALLERGY PO) Take by mouth as needed.    Mariane Baumgarten Sodium (DSS) 100 MG CAPS Take by mouth at bedtime.    . Ferrous Sulfate (IRON PO) Take by mouth daily.    . Fremanezumab-vfrm (AJOVY) 225 MG/1.5ML SOAJ Inject into the skin.    Marland Kitchen lisdexamfetamine (VYVANSE) 50 MG capsule Take by mouth daily.    . meclizine (ANTIVERT) 12.5 MG tablet Take by mouth as needed.     . melatonin 5 MG TABS Take by mouth as needed.    . montelukast (SINGULAIR) 10 MG tablet Take by mouth.    . Multiple Vitamin (MULTI-VITAMIN) tablet Take 1 tablet by mouth daily.    . pantoprazole (PROTONIX) 40 MG tablet Take 40 mg by mouth daily.    . promethazine (PHENERGAN) 25 MG tablet Take 25 mg by mouth 2 (two) times daily as needed.    . Pyridoxine HCl (VITAMIN B6 PO) Take by mouth.    . SENNOSIDES PO Take 500 mg by mouth daily.    . sertraline (ZOLOFT) 100 MG tablet Take 1 tablet by mouth daily.    . SUMAtriptan (IMITREX) 100 MG tablet Take by mouth as needed.    . traMADol (ULTRAM) 50 MG tablet Take by mouth daily.    Marland Kitchen triamcinolone ointment (KENALOG) 0.1 % Apply topically as needed.    Marland Kitchen UNABLE TO FIND Med Name: immunotherapy    . VYVANSE 50 MG capsule Take 50 mg by mouth every morning.     No current facility-administered medications for this visit.    Known medication allergies: Allergies  Allergen Reactions  . Quinolones Other (See Comments)    tendonitis    . Fluticasone Other (See Comments)    Epitaxis   . Other Diarrhea and Nausea And Vomiting    ALLERGIC mollusk  . Tuberculin Ppd Hives  . Morphine Nausea Only and Other (See Comments)    Headache   . Tape Rash    I appreciate the opportunity to take part in Ashayla's care. Please do not hesitate to contact me with questions.  Sincerely,   R. Edgar Frisk, MD

## 2019-12-21 NOTE — Patient Instructions (Addendum)
Allergic rhinitis  The patient will resume aeroallergen immunotherapy under our care using the allergen vaccines and protocol provided by her previous allergist. She has agreed to have access to epinephrine autoinjector.  For now, continue appropriate allergen avoidance measures as well as antihistamine as needed.  Return for follow-up in 4 months, or sooner if necessary.  Mild persistent asthma Stable.  Continue montelukast 10 mg daily at bedtime.  Continue albuterol HFA, 1 to 2 inhalations every 4-6 hours if needed.  Subjective and objective measures of pulmonary function will be followed and the treatment plan will be adjusted accordingly.  History of epistaxis  Avoid nasal steroid sprays.  Nasal saline spray/gel if needed.   Return in about 4 months (around 04/22/2020), or if symptoms worsen or fail to improve.

## 2019-12-25 ENCOUNTER — Encounter: Payer: Self-pay | Admitting: Allergy

## 2019-12-25 ENCOUNTER — Encounter: Payer: Self-pay | Admitting: Allergy and Immunology

## 2019-12-28 ENCOUNTER — Other Ambulatory Visit: Payer: Self-pay

## 2019-12-28 ENCOUNTER — Ambulatory Visit (INDEPENDENT_AMBULATORY_CARE_PROVIDER_SITE_OTHER): Payer: 59

## 2019-12-28 DIAGNOSIS — J301 Allergic rhinitis due to pollen: Secondary | ICD-10-CM

## 2019-12-28 DIAGNOSIS — J309 Allergic rhinitis, unspecified: Secondary | ICD-10-CM

## 2019-12-28 NOTE — Progress Notes (Signed)
Immunotherapy   Patient Details  Name: Sage Hammill MRN: [id number] Date of Birth: 1975-12-21  12/28/2019  Lindsey Posa pt here to transfer vials to our office Following schedule: other  Frequency:weekly Epi-Pen:yes Consent signed and patient instructions given.   Chinita Pester [place] Jaquith 12/28/2019, 4:18 PM

## 2020-01-06 ENCOUNTER — Ambulatory Visit (INDEPENDENT_AMBULATORY_CARE_PROVIDER_SITE_OTHER): Payer: 59

## 2020-01-06 DIAGNOSIS — J309 Allergic rhinitis, unspecified: Secondary | ICD-10-CM | POA: Diagnosis not present

## 2020-01-06 DIAGNOSIS — J301 Allergic rhinitis due to pollen: Secondary | ICD-10-CM

## 2020-01-13 ENCOUNTER — Ambulatory Visit (INDEPENDENT_AMBULATORY_CARE_PROVIDER_SITE_OTHER): Payer: 59 | Admitting: *Deleted

## 2020-01-13 DIAGNOSIS — J309 Allergic rhinitis, unspecified: Secondary | ICD-10-CM

## 2020-01-20 ENCOUNTER — Ambulatory Visit (INDEPENDENT_AMBULATORY_CARE_PROVIDER_SITE_OTHER): Payer: 59 | Admitting: *Deleted

## 2020-01-20 DIAGNOSIS — J309 Allergic rhinitis, unspecified: Secondary | ICD-10-CM | POA: Diagnosis not present

## 2020-01-26 ENCOUNTER — Ambulatory Visit (INDEPENDENT_AMBULATORY_CARE_PROVIDER_SITE_OTHER): Payer: 59 | Admitting: *Deleted

## 2020-01-26 DIAGNOSIS — J309 Allergic rhinitis, unspecified: Secondary | ICD-10-CM | POA: Diagnosis not present

## 2020-02-02 ENCOUNTER — Ambulatory Visit (INDEPENDENT_AMBULATORY_CARE_PROVIDER_SITE_OTHER): Payer: 59

## 2020-02-02 DIAGNOSIS — J309 Allergic rhinitis, unspecified: Secondary | ICD-10-CM | POA: Diagnosis not present

## 2020-02-12 ENCOUNTER — Ambulatory Visit (INDEPENDENT_AMBULATORY_CARE_PROVIDER_SITE_OTHER): Payer: 59 | Admitting: *Deleted

## 2020-02-12 DIAGNOSIS — J309 Allergic rhinitis, unspecified: Secondary | ICD-10-CM

## 2020-02-19 ENCOUNTER — Ambulatory Visit (INDEPENDENT_AMBULATORY_CARE_PROVIDER_SITE_OTHER): Payer: 59

## 2020-02-19 DIAGNOSIS — J309 Allergic rhinitis, unspecified: Secondary | ICD-10-CM | POA: Diagnosis not present

## 2020-02-22 ENCOUNTER — Telehealth: Payer: Self-pay | Admitting: *Deleted

## 2020-02-22 NOTE — Telephone Encounter (Signed)
Returned call from 1:06 PM. Left patient a message of appointment day, time, and arrival time.

## 2020-02-24 ENCOUNTER — Ambulatory Visit: Payer: Self-pay

## 2020-02-24 DIAGNOSIS — J309 Allergic rhinitis, unspecified: Secondary | ICD-10-CM

## 2020-03-07 ENCOUNTER — Encounter: Payer: Self-pay | Admitting: Obstetrics & Gynecology

## 2020-03-07 ENCOUNTER — Other Ambulatory Visit: Payer: Self-pay

## 2020-03-07 ENCOUNTER — Ambulatory Visit (INDEPENDENT_AMBULATORY_CARE_PROVIDER_SITE_OTHER): Payer: Commercial Managed Care - HMO | Admitting: Obstetrics & Gynecology

## 2020-03-07 VITALS — BP 138/86 | HR 113 | Resp 16 | Ht 67.0 in | Wt 196.0 lb

## 2020-03-07 DIAGNOSIS — N921 Excessive and frequent menstruation with irregular cycle: Secondary | ICD-10-CM

## 2020-03-07 DIAGNOSIS — F4310 Post-traumatic stress disorder, unspecified: Secondary | ICD-10-CM | POA: Insufficient documentation

## 2020-03-07 NOTE — Progress Notes (Signed)
   Subjective:    Patient ID: Renee Velasquez, female    DOB: [birth year], 44 y.o.   MRN: [id number]  HPI  Pt recently moved to area.  She had regular gyn care. Regualr menses until 2 years ago; now can have two meses in one month and skips some months.  -- Failed OCPS and Mirena; Pt woulud like an ablation. Pt has not had recent endometrial biopsy nor [place] (6-7 years ago)  Review of Systems  Eyes: Negative.   Respiratory: Negative.   Endocrine: Negative.   Genitourinary: Positive for menstrual problem and vaginal bleeding. Negative for pelvic pain.  Neurological: Negative.   Psychiatric/Behavioral: Negative.        Objective:   Physical Exam Vitals reviewed.  Constitutional:      General: She is not in acute distress.    Appearance: She is well-developed.  HENT:     Head: Normocephalic and atraumatic.  Eyes:     Conjunctiva/sclera: Conjunctivae normal.  Cardiovascular:     Rate and Rhythm: Normal rate.  Pulmonary:     Effort: Pulmonary effort is normal.  Genitourinary:    Comments: Pt is late for allergist appt and can't stay for exam nor endometrial biopsy Skin:    General: Skin is warm and dry.  Neurological:     Mental Status: She is alert and oriented to person, place, and time.    Vitals:   03/07/20 1523  BP: 138/86  Pulse: (!) 113  Resp: 16  Weight: 196 lb (88.9 kg)  Height: 5\' 7"  (1.702 m)      Assessment & Plan:  44 yo female with menometrrohagia  1.  Pt late for allergist appt and unable to stary for exam and endometrial biopsy. 2.  Needs TSH at next visit. 3.  Reviewed records from [facility] and ferritin is low. 4.  TVUS and complete pelvic US ordered.  30 mins spent with patient during visit, reviewing records in care everywhere and documentation.

## 2020-03-11 ENCOUNTER — Telehealth: Payer: Self-pay | Admitting: Surgery

## 2020-03-11 NOTE — Telephone Encounter (Signed)
MBS[employer] left message with clinic phone number encouraging patient to make follow-up appt.  Letter sent.

## 2020-03-21 ENCOUNTER — Other Ambulatory Visit: Payer: 59

## 2020-03-22 ENCOUNTER — Telehealth: Payer: Self-pay | Admitting: *Deleted

## 2020-03-22 NOTE — Telephone Encounter (Signed)
Returned call from 03/18/20 at 2:00 PM and 03/22/20 at 9:31 AM. Left patient a detailed message on steps to surgery and that it would probably not be done by the end of the month. Surgery needed would be based on U/S and Endo Bio results, when they are done.

## 2020-03-23 ENCOUNTER — Other Ambulatory Visit: Payer: 59

## 2020-03-30 ENCOUNTER — Ambulatory Visit (INDEPENDENT_AMBULATORY_CARE_PROVIDER_SITE_OTHER): Payer: 59

## 2020-03-30 ENCOUNTER — Other Ambulatory Visit: Payer: Self-pay

## 2020-03-30 DIAGNOSIS — N921 Excessive and frequent menstruation with irregular cycle: Secondary | ICD-10-CM

## 2020-03-30 DIAGNOSIS — D259 Leiomyoma of uterus, unspecified: Secondary | ICD-10-CM

## 2020-03-31 ENCOUNTER — Encounter: Payer: Self-pay | Admitting: Obstetrics and Gynecology

## 2020-03-31 ENCOUNTER — Ambulatory Visit (INDEPENDENT_AMBULATORY_CARE_PROVIDER_SITE_OTHER): Payer: 59 | Admitting: Obstetrics and Gynecology

## 2020-03-31 ENCOUNTER — Other Ambulatory Visit: Payer: Self-pay | Admitting: Obstetrics and Gynecology

## 2020-03-31 VITALS — BP 127/89 | HR 106 | Wt 178.0 lb

## 2020-03-31 DIAGNOSIS — N939 Abnormal uterine and vaginal bleeding, unspecified: Secondary | ICD-10-CM | POA: Diagnosis not present

## 2020-03-31 DIAGNOSIS — Z01812 Encounter for preprocedural laboratory examination: Secondary | ICD-10-CM

## 2020-03-31 LAB — POCT URINE PREGNANCY: Preg Test, Ur: NEGATIVE

## 2020-03-31 NOTE — Progress Notes (Signed)
GYNECOLOGY OFFICE FOLLOW UP NOTE  History:  44 y.o. [id number] here today for follow up for abnormal uterine bleeding.  Has Ehler's Danlos and had worsening migraines with IUD. Seems to not do well with progesterone of any type. She has always had heavy periods and are getting heavier with age. Last 7 days, middle days are heavy. She is starting to have irregular periods for about a year and a half, sometimes 2x/month and sometimes skips a month. Changing super tampons every 1 hr or 2 on heavy days.   Past Medical History:  Diagnosis Date  . Achilles tendonitis   . Allodynia   . Anxiety   . Arthritis   . Asthma   . Brachial plexopathy   . CFS (chronic fatigue syndrome)   . Chronic joint pain   . Dizziness   . Dysfunctional autonomic nervous system   . Eczema   . Ehlers-Danlos syndrome   . GERD (gastroesophageal reflux disease)   . Hernia, hiatal   . Hypoglycemia   . Hypotension   . IBS (irritable bowel syndrome)   . IBS (irritable bowel syndrome)   . ME (myalgic encephalomyelitis)   . Migraines   . Nerve pain   . Obesity   . PMDD (premenstrual dysphoric disorder)   . Recurrent upper respiratory infection (URI)   . SVT (supraventricular tachycardia) ([place])   . Urticaria   . Vestibular migraine     Past Surgical History:  Procedure Laterality Date  . ADENOIDECTOMY    . CESAREAN SECTION  2008 and 20010  . GASTRECTOMY  08/2016  . HEMORROIDECTOMY    . INCISION AND DRAINAGE ABSCESS ANAL  04/2002, 02/1999 and 9/20011  . lumber laminectomy  02/2010  . SINOSCOPY  1998 and 2019  . TONSILLECTOMY    . TREATMENT FISTULA ANAL       Current Outpatient Medications:  .  acyclovir (ZOVIRAX) 400 MG tablet, Take by mouth as needed., Disp: , Rfl:  .  albuterol (VENTOLIN HFA) 108 (90 Base) MCG/ACT inhaler, Inhale into the lungs., Disp: , Rfl:  .  CALCIUM CITRATE-VITAMIN D3 PO, Take by mouth daily., Disp: , Rfl:  .  cetirizine (ZYRTEC) 10 MG tablet, Take 10 mg by mouth as needed for  allergies., Disp: , Rfl:  .  CLONAZEPAM PO, Take 5 mg by mouth 3 (three) times daily., Disp: , Rfl:  .  cyclobenzaprine (FLEXERIL) 5 MG tablet, Take 5 mg by mouth 3 (three) times daily as needed., Disp: , Rfl:  .  desloratadine (CLARINEX) 5 MG tablet, Take by mouth., Disp: , Rfl:  .  diphenhydrAMINE HCl (BENADRYL ALLERGY PO), Take by mouth as needed., Disp: , Rfl:  .  Docusate Sodium (DSS) 100 MG CAPS, Take by mouth at bedtime., Disp: , Rfl:  .  Ferrous Sulfate (IRON PO), Take by mouth daily., Disp: , Rfl:  .  Fremanezumab-vfrm (AJOVY) 225 MG/1.5ML SOAJ, Inject into the skin., Disp: , Rfl:  .  meclizine (ANTIVERT) 12.5 MG tablet, Take by mouth as needed., Disp: , Rfl:  .  melatonin 5 MG TABS, Take by mouth as needed., Disp: , Rfl:  .  montelukast (SINGULAIR) 10 MG tablet, Take by mouth., Disp: , Rfl:  .  Multiple Vitamin (MULTI-VITAMIN) tablet, Take 1 tablet by mouth daily., Disp: , Rfl:  .  pantoprazole (PROTONIX) 40 MG tablet, Take 40 mg by mouth daily., Disp: , Rfl:  .  promethazine (PHENERGAN) 25 MG tablet, Take 25 mg by mouth 2 (two) times daily as  needed., Disp: , Rfl:  .  Pyridoxine HCl (VITAMIN B6 PO), Take by mouth., Disp: , Rfl:  .  SENNOSIDES PO, Take 500 mg by mouth daily., Disp: , Rfl:  .  sertraline (ZOLOFT) 100 MG tablet, Take 1 tablet by mouth daily., Disp: , Rfl:  .  SUMAtriptan (IMITREX) 100 MG tablet, Take by mouth as needed., Disp: , Rfl:  .  traMADol (ULTRAM) 50 MG tablet, Take by mouth daily., Disp: , Rfl:  .  triamcinolone ointment (KENALOG) 0.1 %, Apply topically as needed., Disp: , Rfl:  .  UNABLE TO FIND, Med Name: immunotherapy, Disp: , Rfl:  .  acetaminophen (TYLENOL) 500 MG tablet, Take by mouth daily. (Patient not taking: Reported on 03/31/2020), Disp: , Rfl:   The following portions of the patient's history were reviewed and updated as appropriate: allergies, current medications, past family history, past medical history, past social history, past surgical  history and problem list.   Review of Systems:  Pertinent items noted in HPI and remainder of comprehensive ROS otherwise negative.   Objective:  Physical Exam BP 127/89   Pulse (!) 106   Wt 178 lb (80.7 kg)   BMI 27.88 kg/m  CONSTITUTIONAL: Well-developed, well-nourished female in no acute distress.  HENT:  Normocephalic, atraumatic. External right and left ear normal. Oropharynx is clear and moist EYES: Conjunctivae and EOM are normal. Pupils are equal, round, and reactive to light. No scleral icterus.  NECK: Normal range of motion, supple, no masses SKIN: Skin is warm and dry. No rash noted. Not diaphoretic. No erythema. No pallor. NEUROLOGIC: Alert and oriented to person, place, and time. Normal reflexes, muscle tone coordination. No cranial nerve deficit noted. PSYCHIATRIC: Normal mood and affect. Normal behavior. Normal judgment and thought content. CARDIOVASCULAR: Normal heart rate noted RESPIRATORY: Effort normal, no problems with respiration noted ABDOMEN: Soft, no distention noted.   PELVIC: Normal appearing external genitalia; normal appearing vaginal mucosa and cervix.  No abnormal discharge noted.  EMB done, see note MUSCULOSKELETAL: Normal range of motion. No edema noted.  Exam done with chaperone present.  Labs and Imaging US PELVIC COMPLETE WITH TRANSVAGINAL  Result Date: 03/30/2020 CLINICAL DATA:  Metrorrhagia x1 year EXAM: TRANSABDOMINAL AND TRANSVAGINAL ULTRASOUND OF PELVIS TECHNIQUE: Both transabdominal and transvaginal ultrasound examinations of the pelvis were performed. Transabdominal technique was performed for global imaging of the pelvis including uterus, ovaries, adnexal regions, and pelvic cul-de-sac. It was necessary to proceed with endovaginal exam following the transabdominal exam to visualize the endometrium. COMPARISON:  None FINDINGS: Uterus Measurements: 7.5 x 4.8 x 5.1 cm = volume: 98 mL. 2.1 x 2.3 x 2.4 cm intramural fibroid in the left uterine  fundus. 1.8 x 1.6 x 1.4 cm intramural fibroid in the right anterior uterine body. Endometrium Thickness: 12 mm.  No focal abnormality visualized. Right ovary Measurements: 3.2 x 1.8 x 1.9 cm = volume: 5.9 mL. 1.6 cm hemorrhagic corpus luteum. Left ovary Measurements: 1.8 x 1.3 x 1.7 cm = volume: 2.1 mL. Normal appearance/no adnexal mass. Other findings No abnormal free fluid. IMPRESSION: Two uterine fibroids, measuring up to 2.4 cm, as above. Endometrial complex measures 12 mm, within normal limits. Electronically Signed   By: Julian Hy M.D.   On: 03/30/2020 16:23    Assessment & Plan:   1. Pre-procedure lab exam - POCT urine pregnancy  2. Abnormal uterine bleeding (AUB) EMB today, see note Reviewed [place], 2 small intramural fibroids - TSH Briefly discussed history of IUD, progesterone use with no improvement  in periods - return for results discussion and ablation consult   Routine preventative health maintenance measures emphasized. Please refer to After Visit Summary for other counseling recommendations.   Return in about 2 weeks (around 04/14/2020) for Followup, in person.  Total face-to-face time with patient: 18 minutes. Over 50% of encounter was spent on counseling and coordination of care.  Feliz Beam, M.D. Attending Center for [employer] [facility] farm manager)

## 2020-04-14 ENCOUNTER — Encounter: Payer: Self-pay | Admitting: Obstetrics and Gynecology

## 2020-04-14 ENCOUNTER — Ambulatory Visit (INDEPENDENT_AMBULATORY_CARE_PROVIDER_SITE_OTHER): Payer: 59 | Admitting: Obstetrics and Gynecology

## 2020-04-14 ENCOUNTER — Other Ambulatory Visit: Payer: Self-pay

## 2020-04-14 VITALS — BP 114/78 | HR 81 | Wt 176.0 lb

## 2020-04-14 DIAGNOSIS — D251 Intramural leiomyoma of uterus: Secondary | ICD-10-CM

## 2020-04-14 DIAGNOSIS — N939 Abnormal uterine and vaginal bleeding, unspecified: Secondary | ICD-10-CM | POA: Diagnosis not present

## 2020-04-14 DIAGNOSIS — N921 Excessive and frequent menstruation with irregular cycle: Secondary | ICD-10-CM | POA: Diagnosis not present

## 2020-04-14 NOTE — Progress Notes (Signed)
GYNECOLOGY OFFICE FOLLOW UP NOTE  History:  44 y.o. [id number] here today for follow up for heavy bleeding. Progressively worse over the years. Mostly monthly periods but occasionally will skip months. Interested in ablation as management as she cannot tolerate progesterone management.    Past Medical History:  Diagnosis Date   Achilles tendonitis    Allodynia    Anxiety    Arthritis    Asthma    Brachial plexopathy    CFS (chronic fatigue syndrome)    Chronic joint pain    Dizziness    Dysfunctional autonomic nervous system    Eczema    Ehlers-Danlos syndrome    GERD (gastroesophageal reflux disease)    Hernia, hiatal    Hypoglycemia    Hypotension    IBS (irritable bowel syndrome)    IBS (irritable bowel syndrome)    ME (myalgic encephalomyelitis)    Migraines    Nerve pain    Obesity    PMDD (premenstrual dysphoric disorder)    Recurrent upper respiratory infection (URI)    SVT (supraventricular tachycardia) (HCC)    Urticaria    Vestibular migraine     Past Surgical History:  Procedure Laterality Date   ADENOIDECTOMY     CESAREAN SECTION  2008 and 20010   GASTRECTOMY  08/2016   HEMORROIDECTOMY     INCISION AND DRAINAGE ABSCESS ANAL  04/2002, 02/1999 and 9/20011   lumber laminectomy  02/2010   SINOSCOPY  1998 and 2019   TONSILLECTOMY     TREATMENT FISTULA ANAL       Current Outpatient Medications:    acyclovir (ZOVIRAX) 400 MG tablet, Take by mouth as needed., Disp: , Rfl:    albuterol (VENTOLIN HFA) 108 (90 Base) MCG/ACT inhaler, Inhale into the lungs., Disp: , Rfl:    CALCIUM CITRATE-VITAMIN D3 PO, Take by mouth daily., Disp: , Rfl:    cetirizine (ZYRTEC) 10 MG tablet, Take 10 mg by mouth as needed for allergies., Disp: , Rfl:    CLONAZEPAM PO, Take 5 mg by mouth 3 (three) times daily., Disp: , Rfl:    cyclobenzaprine (FLEXERIL) 5 MG tablet, Take 5 mg by mouth 3 (three) times daily as needed., Disp: , Rfl:     desloratadine (CLARINEX) 5 MG tablet, Take by mouth., Disp: , Rfl:    diphenhydrAMINE HCl (BENADRYL ALLERGY PO), Take by mouth as needed., Disp: , Rfl:    Docusate Sodium (DSS) 100 MG CAPS, Take by mouth at bedtime., Disp: , Rfl:    Ferrous Sulfate (IRON PO), Take by mouth daily., Disp: , Rfl:    Fremanezumab-vfrm (AJOVY) 225 MG/1.5ML SOAJ, Inject into the skin., Disp: , Rfl:    meclizine (ANTIVERT) 12.5 MG tablet, Take by mouth as needed., Disp: , Rfl:    melatonin 5 MG TABS, Take by mouth as needed., Disp: , Rfl:    montelukast (SINGULAIR) 10 MG tablet, Take by mouth., Disp: , Rfl:    Multiple Vitamin (MULTI-VITAMIN) tablet, Take 1 tablet by mouth daily., Disp: , Rfl:    pantoprazole (PROTONIX) 40 MG tablet, Take 40 mg by mouth daily., Disp: , Rfl:    promethazine (PHENERGAN) 25 MG tablet, Take 25 mg by mouth 2 (two) times daily as needed., Disp: , Rfl:    Pyridoxine HCl (VITAMIN B6 PO), Take by mouth., Disp: , Rfl:    SENNOSIDES PO, Take 500 mg by mouth daily., Disp: , Rfl:    sertraline (ZOLOFT) 100 MG tablet, Take 1 tablet by mouth daily., Disp: ,  Rfl:    SUMAtriptan (IMITREX) 100 MG tablet, Take by mouth as needed., Disp: , Rfl:    traMADol (ULTRAM) 50 MG tablet, Take by mouth daily., Disp: , Rfl:    triamcinolone ointment (KENALOG) 0.1 %, Apply topically as needed., Disp: , Rfl:    UNABLE TO FIND, Med Name: immunotherapy, Disp: , Rfl:    acetaminophen (TYLENOL) 500 MG tablet, Take by mouth daily. (Patient not taking: Reported on 03/31/2020), Disp: , Rfl:   The following portions of the patient's history were reviewed and updated as appropriate: allergies, current medications, past family history, past medical history, past social history, past surgical history and problem list.   Review of Systems:  Pertinent items noted in HPI and remainder of comprehensive ROS otherwise negative.   Objective:  Physical Exam BP 114/78    Pulse 81    Wt 176 lb (79.8 kg)    LMP  04/07/2020    BMI 27.57 kg/m  CONSTITUTIONAL: Well-developed, well-nourished female in no acute distress.  HENT:  Normocephalic, atraumatic. External right and left ear normal. Oropharynx is clear and moist EYES: Conjunctivae and EOM are normal. Pupils are equal, round, and reactive to light. No scleral icterus.  NECK: Normal range of motion, supple, no masses SKIN: Skin is warm and dry. No rash noted. Not diaphoretic. No erythema. No pallor. NEUROLOGIC: Alert and oriented to person, place, and time. Normal reflexes, muscle tone coordination. No cranial nerve deficit noted. PSYCHIATRIC: Normal mood and affect. Normal behavior. Normal judgment and thought content. CARDIOVASCULAR: Normal heart rate noted RESPIRATORY: Effort normal, no problems with respiration noted ABDOMEN: Soft, no distention noted.   PELVIC: deferred MUSCULOSKELETAL: Normal range of motion. No edema noted.  Labs and Imaging US PELVIC COMPLETE WITH TRANSVAGINAL  Result Date: 03/30/2020 CLINICAL DATA:  Metrorrhagia x1 year EXAM: TRANSABDOMINAL AND TRANSVAGINAL ULTRASOUND OF PELVIS TECHNIQUE: Both transabdominal and transvaginal ultrasound examinations of the pelvis were performed. Transabdominal technique was performed for global imaging of the pelvis including uterus, ovaries, adnexal regions, and pelvic cul-de-sac. It was necessary to proceed with endovaginal exam following the transabdominal exam to visualize the endometrium. COMPARISON:  None FINDINGS: Uterus Measurements: 7.5 x 4.8 x 5.1 cm = volume: 98 mL. 2.1 x 2.3 x 2.4 cm intramural fibroid in the left uterine fundus. 1.8 x 1.6 x 1.4 cm intramural fibroid in the right anterior uterine body. Endometrium Thickness: 12 mm.  No focal abnormality visualized. Right ovary Measurements: 3.2 x 1.8 x 1.9 cm = volume: 5.9 mL. 1.6 cm hemorrhagic corpus luteum. Left ovary Measurements: 1.8 x 1.3 x 1.7 cm = volume: 2.1 mL. Normal appearance/no adnexal mass. Other findings No abnormal  free fluid. IMPRESSION: Two uterine fibroids, measuring up to 2.4 cm, as above. Endometrial complex measures 12 mm, within normal limits. Electronically Signed   By: Julian Hy M.D.   On: 03/30/2020 16:23    Assessment & Plan:   1. Abnormal uterine bleeding (AUB) Reviewed benign EMB Reviewed [place] with 2 fibroids, one > 2 cm intramural, otherwise normal appearance - with intramural fibroids, it is unclear if ablation would be effective, on [place] fibroids appear to distort cavity somewhat, would not be good candidate for Novasure, pt verbalizes understanding of this - given inability to tolerate progesterone, recommned trial of lysteda - briefly reviewed hysterectomy as definitive management if lysteda fails - she is agreeable to trial, will call in December when insurance kicks in to get Rx sent  2. Menometrorrhagia  3. Intramural leiomyoma of uterus  Routine preventative health maintenance measures emphasized. Please refer to After Visit Summary for other counseling recommendations.   Return in about 3 months (around 07/15/2020), or if symptoms worsen or fail to improve.  Total face-to-face time with patient: 22 minutes. Over 50% of encounter was spent on counseling and coordination of care.  Feliz Beam, M.D. Attending Center for [employer] [facility] manager)

## 2020-05-09 ENCOUNTER — Ambulatory Visit (INDEPENDENT_AMBULATORY_CARE_PROVIDER_SITE_OTHER): Payer: BC Managed Care – PPO | Admitting: Obstetrics and Gynecology

## 2020-05-09 ENCOUNTER — Encounter: Payer: Self-pay | Admitting: Obstetrics and Gynecology

## 2020-05-09 ENCOUNTER — Other Ambulatory Visit: Payer: Self-pay

## 2020-05-09 VITALS — BP 134/79 | HR 93 | Resp 16 | Ht 67.0 in | Wt 163.0 lb

## 2020-05-09 DIAGNOSIS — R1084 Generalized abdominal pain: Secondary | ICD-10-CM

## 2020-05-09 DIAGNOSIS — N921 Excessive and frequent menstruation with irregular cycle: Secondary | ICD-10-CM

## 2020-05-09 DIAGNOSIS — N939 Abnormal uterine and vaginal bleeding, unspecified: Secondary | ICD-10-CM

## 2020-05-09 DIAGNOSIS — D251 Intramural leiomyoma of uterus: Secondary | ICD-10-CM

## 2020-05-09 MED ORDER — TRANEXAMIC ACID 650 MG PO TABS: 1300.0000 mg | ORAL_TABLET | Freq: Three times a day (TID) | ORAL | 2 refills | Status: DC

## 2020-05-09 NOTE — Progress Notes (Signed)
GYNECOLOGY OFFICE VISIT NOTE  History:  44 y.o. [id number] here today for abdominal pain. Reports she has had pain in abdomen for several days, notes it diffusely all over her abdomen and started after she had to strain to have a bowel movement last Saturday. Pain worst in RLQ but spreads across abdomen. Didn't think much of it at the time but then pain got so bad that she had to take tramadol to improve it and even then, only got relief for 1.5 hrs at a time. Has also noted white stool since then. She is concerned about gallstones as she has had significant weight loss since bariatric surgery. Got better after several days.  She denies any abnormal vaginal discharge, bleeding, pelvic pain or other concerns.   Past Medical History:  Diagnosis Date  . Achilles tendonitis   . Allodynia   . Anxiety   . Arthritis   . Asthma   . Brachial plexopathy   . CFS (chronic fatigue syndrome)   . Chronic joint pain   . Dizziness   . Dysfunctional autonomic nervous system   . Eczema   . Ehlers-Danlos syndrome   . GERD (gastroesophageal reflux disease)   . Hernia, hiatal   . Hypoglycemia   . Hypotension   . IBS (irritable bowel syndrome)   . IBS (irritable bowel syndrome)   . ME (myalgic encephalomyelitis)   . Migraines   . Nerve pain   . Obesity   . PMDD (premenstrual dysphoric disorder)   . Recurrent upper respiratory infection (URI)   . SVT (supraventricular tachycardia) ([place])   . Urticaria   . Vestibular migraine     Past Surgical History:  Procedure Laterality Date  . ADENOIDECTOMY    . CESAREAN SECTION  2008 and 20010  . GASTRECTOMY  08/2016  . HEMORROIDECTOMY    . INCISION AND DRAINAGE ABSCESS ANAL  04/2002, 02/1999 and 9/20011  . lumber laminectomy  02/2010  . SINOSCOPY  1998 and 2019  . TONSILLECTOMY    . TREATMENT FISTULA ANAL       Current Outpatient Medications:  .  acetaminophen (TYLENOL) 500 MG tablet, Take by mouth daily. , Disp: , Rfl:  .  acyclovir (ZOVIRAX) 400  MG tablet, Take by mouth as needed., Disp: , Rfl:  .  albuterol (VENTOLIN HFA) 108 (90 Base) MCG/ACT inhaler, Inhale into the lungs., Disp: , Rfl:  .  CALCIUM CITRATE-VITAMIN D3 PO, Take by mouth daily., Disp: , Rfl:  .  cetirizine (ZYRTEC) 10 MG tablet, Take 10 mg by mouth as needed for allergies., Disp: , Rfl:  .  CLONAZEPAM PO, Take 5 mg by mouth 3 (three) times daily., Disp: , Rfl:  .  cyclobenzaprine (FLEXERIL) 5 MG tablet, Take 5 mg by mouth 3 (three) times daily as needed., Disp: , Rfl:  .  desloratadine (CLARINEX) 5 MG tablet, Take by mouth., Disp: , Rfl:  .  diphenhydrAMINE HCl (BENADRYL ALLERGY PO), Take by mouth as needed., Disp: , Rfl:  .  Docusate Sodium (DSS) 100 MG CAPS, Take by mouth at bedtime., Disp: , Rfl:  .  Ferrous Sulfate (IRON PO), Take by mouth daily., Disp: , Rfl:  .  Fremanezumab-vfrm (AJOVY) 225 MG/1.5ML SOAJ, Inject into the skin., Disp: , Rfl:  .  meclizine (ANTIVERT) 12.5 MG tablet, Take by mouth as needed., Disp: , Rfl:  .  melatonin 5 MG TABS, Take by mouth as needed., Disp: , Rfl:  .  montelukast (SINGULAIR) 10 MG tablet, Take by mouth.,  Disp: , Rfl:  .  Multiple Vitamin (MULTI-VITAMIN) tablet, Take 1 tablet by mouth daily., Disp: , Rfl:  .  pantoprazole (PROTONIX) 40 MG tablet, Take 40 mg by mouth daily., Disp: , Rfl:  .  promethazine (PHENERGAN) 25 MG tablet, Take 25 mg by mouth 2 (two) times daily as needed., Disp: , Rfl:  .  Pyridoxine HCl (VITAMIN B6 PO), Take by mouth., Disp: , Rfl:  .  SENNOSIDES PO, Take 500 mg by mouth daily., Disp: , Rfl:  .  sertraline (ZOLOFT) 100 MG tablet, Take 1 tablet by mouth daily., Disp: , Rfl:  .  SUMAtriptan (IMITREX) 100 MG tablet, Take by mouth as needed., Disp: , Rfl:  .  traMADol (ULTRAM) 50 MG tablet, Take by mouth daily., Disp: , Rfl:  .  triamcinolone ointment (KENALOG) 0.1 %, Apply topically as needed., Disp: , Rfl:  .  UNABLE TO FIND, Med Name: immunotherapy, Disp: , Rfl:  .  tranexamic acid (LYSTEDA) 650 MG TABS  tablet, Take 2 tablets (1,300 mg total) by mouth 3 (three) times daily. Take during menses for a maximum of five days, Disp: 30 tablet, Rfl: 2  The following portions of the patient's history were reviewed and updated as appropriate: allergies, current medications, past family history, past medical history, past social history, past surgical history and problem list.   Review of Systems:  Pertinent items noted in HPI and remainder of comprehensive ROS otherwise negative.   Objective:  Physical Exam BP 134/79   Pulse 93   Resp 16   Ht 5\' 7"  (1.702 m)   Wt 163 lb (73.9 kg)   LMP 05/08/2020   BMI 25.53 kg/m  CONSTITUTIONAL: Well-developed, well-nourished female in no acute distress.  HENT:  Normocephalic, atraumatic. External right and left ear normal. Oropharynx is clear and moist EYES: Conjunctivae and EOM are normal. Pupils are equal, round, and reactive to light. No scleral icterus.  NECK: Normal range of motion, supple, no masses SKIN: Skin is warm and dry. No rash noted. Not diaphoretic. No erythema. No pallor. NEUROLOGIC: Alert and oriented to person, place, and time. Normal reflexes, muscle tone coordination. No cranial nerve deficit noted. PSYCHIATRIC: Normal mood and affect. Normal behavior. Normal judgment and thought content. CARDIOVASCULAR: Normal heart rate noted RESPIRATORY: Effort normal, no problems with respiration noted ABDOMEN: Soft, no distention noted.   PELVIC: deferred MUSCULOSKELETAL: Normal range of motion. No edema noted.  Labs and Imaging No results found.  Assessment & Plan:  1. Abnormal uterine bleeding (AUB) Start lysteda with next periods  2. Intramural leiomyoma of uterus Reviewed options for management including surgery and RFA  3. Menometrorrhagia  4. Generalized abdominal pain - diffuse, generalized abdominal pain that started after BM unlikely to be fibroids in nature - See bariatric surgeon for pain and workup   Routine preventative  health maintenance measures emphasized. Please refer to After Visit Summary for other counseling recommendations.   Return in about 3 months (around 08/07/2020) for in person, Followup.   Total face-to-face time with patient: 22 minutes. Over 50% of encounter was spent on counseling and coordination of care.   Feliz Beam, M.D. Attending Center for [employer] farm manager)

## 2020-05-17 ENCOUNTER — Other Ambulatory Visit: Payer: Self-pay | Admitting: Surgery

## 2020-05-17 DIAGNOSIS — R1011 Right upper quadrant pain: Secondary | ICD-10-CM

## 2020-05-17 DIAGNOSIS — Z9884 Bariatric surgery status: Secondary | ICD-10-CM

## 2020-05-18 ENCOUNTER — Other Ambulatory Visit: Payer: Self-pay

## 2020-05-18 ENCOUNTER — Ambulatory Visit (INDEPENDENT_AMBULATORY_CARE_PROVIDER_SITE_OTHER): Payer: BC Managed Care – PPO

## 2020-05-18 DIAGNOSIS — Z9884 Bariatric surgery status: Secondary | ICD-10-CM

## 2020-05-18 DIAGNOSIS — K8020 Calculus of gallbladder without cholecystitis without obstruction: Secondary | ICD-10-CM | POA: Diagnosis not present

## 2020-05-18 DIAGNOSIS — R1011 Right upper quadrant pain: Secondary | ICD-10-CM

## 2021-01-26 ENCOUNTER — Ambulatory Visit (INDEPENDENT_AMBULATORY_CARE_PROVIDER_SITE_OTHER): Payer: BC Managed Care – PPO | Admitting: Obstetrics and Gynecology

## 2021-01-26 ENCOUNTER — Encounter: Payer: Self-pay | Admitting: Obstetrics and Gynecology

## 2021-01-26 ENCOUNTER — Other Ambulatory Visit (HOSPITAL_COMMUNITY)
Admission: RE | Admit: 2021-01-26 | Discharge: 2021-01-26 | Disposition: A | Discharge status: Home / Self Care | Payer: BC Managed Care – PPO | Source: Ambulatory Visit | Attending: Obstetrics and Gynecology | Admitting: Obstetrics and Gynecology

## 2021-01-26 ENCOUNTER — Other Ambulatory Visit: Payer: Self-pay

## 2021-01-26 VITALS — BP 111/77 | HR 83 | Ht 67.0 in | Wt 148.0 lb

## 2021-01-26 DIAGNOSIS — N898 Other specified noninflammatory disorders of vagina: Secondary | ICD-10-CM

## 2021-01-26 MED ORDER — METRONIDAZOLE 0.75 % VA GEL: 1.0000 | Freq: Two times a day (BID) | VAGINAL | 2 refills | Status: DC

## 2021-01-26 MED ORDER — FLUCONAZOLE 150 MG PO TABS
150.0000 mg | ORAL_TABLET | ORAL | 1 refills | Status: DC
Start: 2021-01-26 — End: 2021-04-12

## 2021-01-26 NOTE — Progress Notes (Signed)
diflucan   GYNECOLOGY OFFICE VISIT NOTE  History:   Renee Velasquez is a 45 y.o. [id number] here today for vaginal irritation. She has had a longstanding history of vaginal irritation that comes and goes in waves every several years or so. It is not persistent and usually has a trigger that takes her time identify I.e. in the past it was stevia, husband's sperm, etc. She is in a current wave with little relief. She has some vaginal discharge but no odor. She will usually wait it out a week or two to see if it goes away. She takes an oral probiotic and feels like it helps. She thinks a recent bubble bath may have been the trigger this time.   She is married. She works in [employer].   She denies any bleeding, pelvic pain or other concerns.   The following portions of the patient's history were reviewed and updated as appropriate: allergies, current medications, past family history, past medical history, past social history, past surgical history and problem list.   Health Maintenance:  Normal pap on 11/2019 - no HPV done.    Review of Systems:  Pertinent items noted in HPI and remainder of comprehensive ROS otherwise negative.  Physical Exam:  BP 111/77   Pulse 83   Ht '5\' 7"'$  (1.702 m)   Wt 148 lb (67.1 kg)   LMP 12/24/2020   BMI 23.18 kg/m  CONSTITUTIONAL: Well-developed, well-nourished female in no acute distress.  HEENT:  Normocephalic, atraumatic. External right and left ear normal. No scleral icterus.  NECK: Normal range of motion, supple, no masses noted on observation SKIN: No rash noted. Not diaphoretic. No erythema. No pallor. MUSCULOSKELETAL: Normal range of motion. No edema noted. NEUROLOGIC: Alert and oriented to person, place, and time. Normal muscle tone coordination. No cranial nerve deficit noted. PSYCHIATRIC: Normal mood and affect. Normal behavior. Normal judgment and thought content.  CARDIOVASCULAR: Normal heart rate noted RESPIRATORY: Effort and breath sounds normal, no  problems with respiration noted ABDOMEN: No masses noted. No other overt distention noted.    PELVIC: Normal appearing external genitalia; normal urethral meatus; normal appearing vaginal mucosa and cervix.  Thin gray discharge with intermittent white clumps noted.  Normal uterine size, no other palpable masses, no uterine or adnexal tenderness. Performed in the presence of a chaperone  Labs and Imaging No results found for this or any previous visit (from the past 168 hour(s)). No results found.    Assessment and Plan:  Diagnoses and all orders for this visit:  Vaginal irritation - Will do cultures and therapy based on exam and her history. Refills given in the event it is incomplete while she is on her cruise.  - She is due for annual in June/July next year.  -     Cervicovaginal ancillary only( [place]) -     metroNIDAZOLE (METROGEL VAGINAL) 0.75 % vaginal gel; Place 1 Applicatorful vaginally 2 (two) times daily. -     fluconazole (DIFLUCAN) 150 MG tablet; Take 1 tablet (150 mg total) by mouth every 3 (three) days. For three doses  Routine preventative health maintenance measures emphasized. Please refer to After Visit Summary for other counseling recommendations.   Return in about 10 months (around 12/04/2021), or annual exam.     Radene Gunning, MD, [place] for [facility], [place]

## 2021-01-27 LAB — CERVICOVAGINAL ANCILLARY ONLY
Bacterial Vaginitis (gardnerella): POSITIVE — AB
Candida Glabrata: NEGATIVE
Candida Vaginitis: POSITIVE — AB
Comment: NEGATIVE
Comment: NEGATIVE
Comment: NEGATIVE

## 2021-01-30 ENCOUNTER — Telehealth: Payer: Self-pay

## 2021-01-30 NOTE — Telephone Encounter (Addendum)
Spoke with pt and she is aware of positive BV and yeast. Pt is aware prescriptions have been sent to pharmacy.   ----- Message from Radene Gunning, MD sent at 01/30/2021  8:17 AM EDT ----- Please let the pt know her cultures are c/w yeast and bv. She already has an rx for these.   Thanks, pad

## 2021-04-12 ENCOUNTER — Telehealth: Payer: Self-pay

## 2021-04-12 DIAGNOSIS — N898 Other specified noninflammatory disorders of vagina: Secondary | ICD-10-CM

## 2021-04-12 MED ORDER — METRONIDAZOLE 0.75 % VA GEL: 1.0000 | Freq: Two times a day (BID) | VAGINAL | 2 refills | Status: DC

## 2021-04-12 MED ORDER — FLUCONAZOLE 150 MG PO TABS: 150.0000 mg | ORAL_TABLET | ORAL | 1 refills | Status: DC

## 2021-04-12 NOTE — Telephone Encounter (Addendum)
Refills of Metrogel and Diflucan sent per Dr.Duncan. I spoke with pt and let her know refills have been sent. Pt expressed understanding.   ----- Message from Radene Gunning, MD sent at 04/12/2021  1:20 PM EST ----- Regarding: RE: Med Refill That is fine in her case but I would give just a couple refills. If she needs more then 3-4 doses in a year, we need to reevaluate for chronic/recurrent BV/Yeast.   pad ----- Message ----- From: Lyndal Rainbow, CMA Sent: 04/12/2021   9:07 AM EST To: Radene Gunning, MD Subject: Med Refill                                     Hey Dr.Duncan,  This pt called requesting a refill of Metrogel and Diflucan. I explained to pt that we don't normally refill these in this office and that she would need an appt to be tested. Pt states she told you at her last visit that she has a chronic issue with yeast and BV and that you told her she can have refills whenever she needs them. You did give her refills last time because she was going on a cruise and she wanted to be sure that it all cleared up. I don't see in your note where you gave permission for her to have refills at any time if she calls office. Can you clarify for me if she can have refills or if she needs to be tested first?  Thanks!

## 2021-10-15 ENCOUNTER — Other Ambulatory Visit: Payer: Self-pay | Admitting: Obstetrics and Gynecology

## 2021-10-15 DIAGNOSIS — N898 Other specified noninflammatory disorders of vagina: Secondary | ICD-10-CM

## 2021-10-16 ENCOUNTER — Telehealth: Payer: Self-pay

## 2021-10-16 DIAGNOSIS — B379 Candidiasis, unspecified: Secondary | ICD-10-CM

## 2021-10-16 MED ORDER — FLUCONAZOLE 150 MG PO TABS
150.0000 mg | ORAL_TABLET | Freq: Once | ORAL | 0 refills | Status: AC
Start: 2021-10-16 — End: 2021-10-16

## 2021-10-16 NOTE — Telephone Encounter (Signed)
Pt called requesting refill of Diflucan due to yeast infection symptoms. Diflucan sent.  ?

## 2021-10-28 ENCOUNTER — Emergency Department (HOSPITAL_COMMUNITY): Payer: BC Managed Care – PPO

## 2021-10-28 ENCOUNTER — Emergency Department (HOSPITAL_COMMUNITY)
Admission: EM | Admit: 2021-10-28 | Discharge: 2021-10-28 | Disposition: A | Discharge status: Home / Self Care | Payer: BC Managed Care – PPO | Attending: Emergency Medicine | Admitting: Emergency Medicine

## 2021-10-28 ENCOUNTER — Other Ambulatory Visit: Payer: Self-pay

## 2021-10-28 ENCOUNTER — Encounter (HOSPITAL_COMMUNITY): Payer: Self-pay

## 2021-10-28 DIAGNOSIS — M79601 Pain in right arm: Secondary | ICD-10-CM | POA: Insufficient documentation

## 2021-10-28 DIAGNOSIS — M542 Cervicalgia: Secondary | ICD-10-CM | POA: Insufficient documentation

## 2021-10-28 DIAGNOSIS — R202 Paresthesia of skin: Secondary | ICD-10-CM | POA: Insufficient documentation

## 2021-10-28 MED ORDER — LIDOCAINE 5 % EX PTCH
1.0000 | MEDICATED_PATCH | CUTANEOUS | Status: DC
  Administered 2021-10-28: 1 via TRANSDERMAL
  Filled 2021-10-28: qty 1

## 2021-10-28 MED ORDER — LIDOCAINE 4 % EX PTCH: 1.0000 | MEDICATED_PATCH | CUTANEOUS | 0 refills | Status: DC

## 2021-10-28 NOTE — ED Triage Notes (Signed)
Pt reports getting her right arm stuck in elevator doors on Thursday morning and attempting to pull her arm out. Pt now endorses right hand tingling and right neck spasms.

## 2021-10-28 NOTE — ED Notes (Signed)
Ortho called for thumb spica  

## 2021-10-28 NOTE — Discharge Instructions (Signed)
You were seen in the emergency department for evaluation of your right arm pain as well as your right sided neck pain.  Your imaging of your right shoulder, forearm, and wrist are normal.  As previously discussed, you do have some snuffbox tenderness, which is concerning for a possible hidden fracture.  Because of this, I placed you in a thumb spica.  You will need to follow-up with hand surgery for possible further evaluation and/or imaging.  I have included information in the chart and you need to call to schedule an appointment.  You can take Tylenol as needed for pain.  I suggest you using your clonazepam for muscle for the activation to for your right side of your neck.  If you have any temperature difference of your hands, color changes, decrease sensation, weakness, stiff neck, fever, please return to the nearest emergency department for reevaluation.  Contact a doctor if: The skin around the cast or splint gets red or raw. The skin under the cast is very itchy or painful. Your cast or splint: Gets damaged. Feels very uncomfortable. Is too tight or too loose. Your cast becomes wet or it starts to have a soft spot or area. There is a bad smell coming from under your cast. You get an object stuck under your cast. Get help right away if: You get any symptoms of compartment syndrome, such as: Very bad pain or pressure under the cast. Numbness, tingling, coldness, or pale or bluish skin. The part of your body above or below the cast is swollen, and it turns a different color (is discolored). You cannot feel or move your fingers or toes. Your pain gets worse. There is fluid leaking through the cast. You have trouble breathing or shortness of breath. You have chest pain. These symptoms may be an emergency. Get help right away. Call your local emergency services (911 in the [place] Do not wait to see if the symptoms will go away. Do not drive yourself to the hospital.

## 2021-10-28 NOTE — Progress Notes (Signed)
Orthopedic Tech Progress Note Patient Details:  Renee Velasquez [birth year] [id number]  Ortho Devices Type of Ortho Device: Thumb spica splint Ortho Device/Splint Location: right Ortho Device/Splint Interventions: Application   Post Interventions Patient Tolerated: Well Instructions Provided: Care of device  [place] Pink 10/28/2021, 6:39 PM

## 2021-10-28 NOTE — ED Provider Notes (Signed)
[facility] DEPT Provider Note   CSN: [id number] Arrival date & time: 10/28/21  1415     History Chief Complaint  Patient presents with   Arm Pain    Renee Velasquez is a 46 y.o. female with h/o EDS presents to the ED for evaluation of right upper extremity pain.  On Thursday, 2 days ago, she had placed her hand within the doors of an elevator to keep it open when the elevator doors closed on her arm.  She tried to pull her hand/arm out of the elevator doors without success. She does not know how long the elevator doors were closed, but she was able to free her hand.  She reports she was having some mild pain on Thursday, but last night, on Friday, she was having worsening pain.  She reports that if she was laying on her right side or rolls over, she would wake up with the pain.  She reports she is feeling "zaps" of pain in her right wrist/into her forearm.  She also reports some right-sided neck and shoulder pain to.  She reports numbness and tingling/pins and needle feeling down her right arm as well.  Denies any weakness.   Arm Pain Pertinent negatives include no chest pain and no shortness of breath.      Home Medications Prior to Admission medications   Medication Sig Start Date End Date Taking? Authorizing Provider  acetaminophen (TYLENOL) 500 MG tablet Take by mouth daily.     [provider]  acyclovir (ZOVIRAX) 400 MG tablet Take by mouth as needed.    [provider]  albuterol (VENTOLIN HFA) 108 (90 Base) MCG/ACT inhaler Inhale into the lungs. 04/16/19   [provider]  CALCIUM CITRATE-VITAMIN D3 PO Take by mouth daily.    [provider]  cetirizine (ZYRTEC) 10 MG tablet Take 10 mg by mouth as needed for allergies.    [provider]  CLONAZEPAM PO Take 5 mg by mouth 3 (three) times daily.    [provider]  cyclobenzaprine (FLEXERIL) 5 MG tablet Take 5 mg by mouth 3 (three) times daily as  needed. 11/18/19   [provider]  desloratadine (CLARINEX) 5 MG tablet Take by mouth. 03/18/19   [provider]  diphenhydrAMINE HCl (BENADRYL ALLERGY PO) Take by mouth as needed.    [provider]  Docusate Sodium (DSS) 100 MG CAPS Take by mouth at bedtime.    [provider]  Ferrous Sulfate (IRON PO) Take by mouth daily.    [provider]  fluconazole (DIFLUCAN) 150 MG tablet Take 1 tablet (150 mg total) by mouth every 3 (three) days. For three doses 04/12/21   Radene Gunning, MD  Fremanezumab-vfrm (AJOVY) 225 MG/1.5ML SOAJ Inject into the skin.    [provider]  meclizine (ANTIVERT) 12.5 MG tablet Take by mouth as needed.    [provider]  melatonin 5 MG TABS Take by mouth as needed.    [provider]  metroNIDAZOLE (METROGEL VAGINAL) 0.75 % vaginal gel Place 1 Applicatorful vaginally 2 (two) times daily. 04/12/21   Radene Gunning, MD  montelukast (SINGULAIR) 10 MG tablet Take by mouth. 05/26/19 05/25/20  [provider]  Multiple Vitamin (MULTI-VITAMIN) tablet Take 1 tablet by mouth daily.    [provider]  pantoprazole (PROTONIX) 40 MG tablet Take 40 mg by mouth daily. 12/01/19   [provider]  promethazine (PHENERGAN) 25 MG tablet Take 25 mg by mouth 2 (  two) times daily as needed. 11/16/19   [provider]  Pyridoxine HCl (VITAMIN B6 PO) Take by mouth.    [provider]  SENNOSIDES PO Take 500 mg by mouth daily.    [provider]  sertraline (ZOLOFT) 100 MG tablet Take 1 tablet by mouth daily. 07/15/19   [provider]  SUMAtriptan (IMITREX) 100 MG tablet Take by mouth as needed. 06/19/19 06/18/20  [provider]  traMADol (ULTRAM) 50 MG tablet Take by mouth daily. 07/04/19   [provider]  tranexamic acid (LYSTEDA) 650 MG TABS tablet Take 2 tablets (1,300 mg total) by mouth 3 (three) times daily. Take during menses for a maximum  of five days 05/09/20   Sloan Leiter, MD  UNABLE TO FIND Med Name: immunotherapy    [provider]      Allergies    Quinolones, Fluticasone, Other, Tuberculin ppd, Morphine, and Tape    Review of Systems   Review of Systems  Constitutional:  Negative for chills and fever.  Respiratory:  Negative for shortness of breath.   Cardiovascular:  Negative for chest pain.  Musculoskeletal:  Positive for arthralgias and neck pain. Negative for neck stiffness.  Neurological:  Positive for numbness. Negative for weakness.   Physical Exam Updated Vital Signs BP (!) 148/95 (BP Location: Left Arm)   Pulse 81   Temp 98 F (36.7 C) (Oral)   Resp 18   SpO2 96%  Physical Exam Vitals and nursing note reviewed.  Constitutional:      General: She is not in acute distress.    Appearance: Normal appearance. She is not ill-appearing or toxic-appearing.  HENT:     Mouth/Throat:     Mouth: Mucous membranes are moist.  Eyes:     General: No scleral icterus. Neck:     Comments: Patient does have a palpable muscle spasm at the right lateral neck, likely trapezius.  No overlying skin changes or signs of trauma.  No midline tenderness.  No step-offs or deformities.  No increased warmth or erythema. Pulmonary:     Effort: Pulmonary effort is normal. No respiratory distress.  Musculoskeletal:        General: Tenderness present. No deformity. Normal range of motion.     Cervical back: Normal range of motion. Tenderness present. No rigidity.     Comments: No obvious trauma noted patient hand, wrist, or forearm.  There is possibly a very small abrasion noted on the volar surface of the wrist at the very radial side.  Does have positive snuffbox tenderness on the right.  She has equal grip strength bilaterally.  She reports a decrease/different sensation in her right upper extremity, however if she can feel light touch.  Her cap refill is less than 2 seconds.  She does have full range of motion of  fingers, wrist, and elbow.  Compartments are soft.  She has palpable radial pulses that are equal bilaterally.  No temperature difference or difference in coloration.  The patient does have a spray tan.  Patient has no midline or paraspinal cervical and thoracic, or lumbar tenderness palpation.  No step offs or deformity.  For the right shoulder, she had more tenderness anteriorly without any step-off or deformity.  Skin:    General: Skin is dry.     Findings: No rash.  Neurological:     General: No focal deficit present.     Mental Status: She is alert. Mental status is at baseline.  Sensory: Sensory deficit present.  Psychiatric:        Mood and Affect: Mood normal.         ED Results / Procedures / Treatments   Labs (all labs ordered are listed, but only abnormal results are displayed) Labs Reviewed - No data to display  EKG None  Radiology DG Shoulder Right  Result Date: 10/28/2021 CLINICAL DATA:  Right arm pain/injury EXAM: RIGHT SHOULDER - 2+ VIEW COMPARISON:  None Available. FINDINGS: No fracture or dislocation is seen. The joint spaces are preserved. The visualized soft tissues are unremarkable. Visualized right lung is clear. IMPRESSION: Negative. Electronically Signed   By: Julian Hy M.D.   On: 10/28/2021 17:08   DG Forearm Right  Result Date: 10/28/2021 CLINICAL DATA:  Right arm pain/injury EXAM: RIGHT FOREARM - 2 VIEW COMPARISON:  None Available. FINDINGS: No fracture or dislocation is seen. The joint spaces are preserved. The visualized soft tissues are unremarkable. IMPRESSION: Negative. Electronically Signed   By: Julian Hy M.D.   On: 10/28/2021 17:07   DG Wrist Complete Right  Result Date: 10/28/2021 CLINICAL DATA:  Right arm pain/injury, snuffbox tenderness EXAM: RIGHT WRIST - COMPLETE 3+ VIEW COMPARISON:  None Available. FINDINGS: No fracture or dislocation is seen. The joint spaces are preserved. The visualized soft tissues are  unremarkable. IMPRESSION: Negative. Electronically Signed   By: Julian Hy M.D.   On: 10/28/2021 17:07    Procedures Procedures   Medications Ordered in ED Medications - No data to display  ED Course/ Medical Decision Making/ A&P                           Medical Decision Making Amount and/or Complexity of Data Reviewed Radiology: ordered.  Risk OTC drugs. Prescription drug management.   46 year old female presents the emergency department for evaluation of right arm pain after getting stuck in an elevator.  Differential diagnosis includes is not limited to contusion, sprain, strain, nerve damage, muscle spasm, compartment syndrome.  Vital signs show mildly out of blood pressure 148/95 otherwise afebrile, normal pulse rate, satting well on room air without any increased work of breathing.  Exam is pertinent for well-appearing patient.  There is no midline neck tenderness.  No step-offs or deformities.  No overlying erythema or warmth.  The patient does have a palpable muscle spasm on the right paraspinal/lateral neck, likely trapezius.  No overlying skin changes to the muscle.  The patient has full range of motion of her neck.  No neck rigidity .No obvious trauma noted patient hand, wrist, or forearm.  There is possibly a very small abrasion noted on the volar surface of the wrist at the very radial side.  Does have positive snuffbox tenderness on the right.  She has equal grip strength bilaterally.  She reports a decrease/different sensation in her right upper extremity, however if she can feel light touch.  Her cap refill is less than 2 seconds.  She does have full range of motion of fingers, wrist, and elbow.  Compartments are soft.  She has palpable radial pulses that are equal bilaterally.  No temperature difference or difference in coloration.  The patient does have a spray tan. Patient has no midline or paraspinal cervical and thoracic, or lumbar tenderness palpation.  No step offs  or deformity.  For the right shoulder, she had more tenderness anteriorly without any overlying skin changes or step-off or deformity noted.  Although the area appears atraumatic,  will order wrist, forearm, and shoulder x-rays.  I independently reviewed and interpreted the patient's imaging and agree with radiologist interpretation.  For the patient's right shoulder, forearm, and wrist, the soft tissues are unremarkable.  Joint spaces are preserved.  No fracture or dislocation visualized.  Lidocaine patch ordered for patient's pain.  Given the patient's snuffbox tenderness, there could be an occult scaphoid fracture.  We will place patient in a thumb spica cast and have her follow-up with hand surgery.  The patient cannot take ibuprofen due to her ulcers and history of gastric bypass.  Advised patient she can take Tylenol.  She reports that she has a prescription for clonazepam for her migraines.  I suggested that she use this in place of the muscle laxer as this may possibly work better.  She verbalizes understanding.  Additionally, she reports that she has some Salonpas patches at home, but I will order her some lidocaine patches as well.  Again, I stressed the importance of following with hand surgery for reevaluation and possible additional imaging or recasting.  Cast care instructions placed into the discharge paperwork.  We discussed strict return precautions and red flag symptoms.  Patient verbalized understanding and agrees to plan.  Patient is stable and being discharged home in good condition.   Final Clinical Impression(s) / ED Diagnoses Final diagnoses:  Right arm pain  Neck pain    Rx / DC Orders ED Discharge Orders          Ordered    lidocaine (HM LIDOCAINE PATCH) 4 %  Every 24 hours        10/28/21 1930              Sherrell Puller, Hershal Coria 10/28/21 2202    Tegeler, Gwenyth Allegra, MD 10/28/21 2241

## 2021-11-29 DIAGNOSIS — G9332 Myalgic encephalomyelitis/chronic fatigue syndrome: Secondary | ICD-10-CM | POA: Insufficient documentation

## 2021-11-29 DIAGNOSIS — F900 Attention-deficit hyperactivity disorder, predominantly inattentive type: Secondary | ICD-10-CM | POA: Insufficient documentation

## 2022-01-22 ENCOUNTER — Ambulatory Visit: Payer: BC Managed Care – PPO | Admitting: Obstetrics and Gynecology

## 2022-01-29 ENCOUNTER — Telehealth: Payer: Self-pay | Admitting: *Deleted

## 2022-01-29 NOTE — Telephone Encounter (Signed)
Pt called stating that she has not had her period in 8 months and has done home UPT which were neg.  She had an appt last week that had to be rescheduled due to MD calling out.  She is rescheduled for Sept but wants a UPT and [facility] done before her appt in Sept.

## 2022-01-31 ENCOUNTER — Other Ambulatory Visit (INDEPENDENT_AMBULATORY_CARE_PROVIDER_SITE_OTHER): Payer: BC Managed Care – PPO

## 2022-01-31 DIAGNOSIS — Z113 Encounter for screening for infections with a predominantly sexual mode of transmission: Secondary | ICD-10-CM

## 2022-01-31 DIAGNOSIS — N912 Amenorrhea, unspecified: Secondary | ICD-10-CM

## 2022-01-31 NOTE — Progress Notes (Addendum)
There is a note in pt's chart that she spoke with Sherryle Lis, RN. Pt is here for BHCG and FSH due to amenorrhea. Pt also requested STI blood work. Pt is also requesting "full panel of blood work". Pt was instructed to go to PCP for testing like that to ensure they order the correct testing she is requesting and so they can manage any possible abnormal results.  Pt does have appt with Dr.Duncan in September.

## 2022-01-31 NOTE — Addendum Note (Signed)
Addended by: Lyndal Rainbow on: 01/31/2022 11:11 AM   Modules accepted: Orders

## 2022-02-01 LAB — FOLLICLE STIMULATING HORMONE: FSH: 30.8 m[IU]/mL

## 2022-02-01 LAB — HEPATITIS C ANTIBODY: Hepatitis C Ab: NONREACTIVE

## 2022-02-01 LAB — HEPATITIS B SURFACE ANTIGEN: Hepatitis B Surface Ag: NONREACTIVE

## 2022-02-01 LAB — RPR: RPR Ser Ql: NONREACTIVE

## 2022-02-01 LAB — HCG, QUANTITATIVE, PREGNANCY: HCG, Total, QN: <5 m[IU]/mL

## 2022-02-01 LAB — HIV ANTIBODY (ROUTINE TESTING W REFLEX): HIV 1&2 Ab, 4th Generation: NONREACTIVE

## 2022-02-06 ENCOUNTER — Telehealth: Payer: Self-pay

## 2022-02-06 NOTE — Telephone Encounter (Signed)
Spoke with pt and she is aware of negative STI screening and negative BHCG. Pt is aware [place] is 30.8 and Dr.Duncan will discuss more at next appt. Pt expressed understanding.

## 2022-02-26 ENCOUNTER — Ambulatory Visit (INDEPENDENT_AMBULATORY_CARE_PROVIDER_SITE_OTHER): Payer: BC Managed Care – PPO | Admitting: Obstetrics and Gynecology

## 2022-02-26 ENCOUNTER — Encounter: Payer: Self-pay | Admitting: Obstetrics and Gynecology

## 2022-02-26 ENCOUNTER — Other Ambulatory Visit (HOSPITAL_COMMUNITY)
Admission: RE | Admit: 2022-02-26 | Discharge: 2022-02-26 | Disposition: A | Discharge status: Home / Self Care | Payer: BC Managed Care – PPO | Source: Ambulatory Visit | Attending: Obstetrics and Gynecology | Admitting: Obstetrics and Gynecology

## 2022-02-26 VITALS — BP 126/79 | HR 66 | Ht 67.0 in | Wt 144.0 lb

## 2022-02-26 DIAGNOSIS — N898 Other specified noninflammatory disorders of vagina: Secondary | ICD-10-CM | POA: Diagnosis not present

## 2022-02-26 DIAGNOSIS — Z1231 Encounter for screening mammogram for malignant neoplasm of breast: Secondary | ICD-10-CM

## 2022-02-26 DIAGNOSIS — Z01419 Encounter for gynecological examination (general) (routine) without abnormal findings: Secondary | ICD-10-CM

## 2022-02-26 DIAGNOSIS — Z124 Encounter for screening for malignant neoplasm of cervix: Secondary | ICD-10-CM | POA: Insufficient documentation

## 2022-02-26 DIAGNOSIS — N951 Menopausal and female climacteric states: Secondary | ICD-10-CM

## 2022-02-26 DIAGNOSIS — Z23 Encounter for immunization: Secondary | ICD-10-CM

## 2022-02-26 DIAGNOSIS — R85610 Atypical squamous cells of undetermined significance on cytologic smear of anus (ASC-US): Secondary | ICD-10-CM | POA: Insufficient documentation

## 2022-02-26 DIAGNOSIS — R87810 Cervical high risk human papillomavirus (HPV) DNA test positive: Secondary | ICD-10-CM | POA: Insufficient documentation

## 2022-02-26 DIAGNOSIS — D251 Intramural leiomyoma of uterus: Secondary | ICD-10-CM

## 2022-02-26 NOTE — Progress Notes (Signed)
Needs mammogram

## 2022-02-26 NOTE — Progress Notes (Unsigned)
GYNECOLOGY ANNUAL PREVENTATIVE CARE ENCOUNTER NOTE  Subjective:   Renee Velasquez is a 46 y.o. G44P2012 female here for a annual gynecologic exam. Current complaints: vaginal itching. Needs pap.     She has vaginal itching and occasional dysuria. Improved today. Has had this symptom before and has had infection, sometimes goes away on its own and sometimes she needs diflucan.   Has not had a period in 8 months. Fate elevated. Still feels bloated. Having night sweats and hot flushes.  Denies abnormal vaginal bleeding, discharge, pelvic pain, problems with intercourse or other gynecologic concerns. Accepts STI screen.   Gynecologic History No LMP recorded. (Menstrual status: Perimenopausal). Contraception: post menopausal status Last Pap: 1-2 years ago. Results: normal per patient Last mammogram: unknown DEXA: has never had  Obstetric History OB History  Gravida Para Term Preterm AB Living  '3 2 2   1 2  '$ SAB IAB Ectopic Multiple Live Births          2    # Outcome Date GA Lbr Len/2nd Weight Sex Delivery Anes PTL Lv  3 AB           2 Term           1 Term             Past Medical History:  Diagnosis Date   Achilles tendonitis    Allodynia    Anxiety    Arthritis    Asthma    Brachial plexopathy    CFS (chronic fatigue syndrome)    Chronic joint pain    Dizziness    Dysfunctional autonomic nervous system    Eczema    Ehlers-Danlos syndrome    GERD (gastroesophageal reflux disease)    Hernia, hiatal    Hypoglycemia    Hypotension    IBS (irritable bowel syndrome)    IBS (irritable bowel syndrome)    ME (myalgic encephalomyelitis)    Migraines    Nerve pain    Obesity    PMDD (premenstrual dysphoric disorder)    Recurrent upper respiratory infection (URI)    SVT (supraventricular tachycardia) (HCC)    Urticaria    Vestibular migraine     Past Surgical History:  Procedure Laterality Date   ADENOIDECTOMY     CESAREAN SECTION  2008 and 20010   GASTRECTOMY   08/2016   HEMORROIDECTOMY     INCISION AND DRAINAGE ABSCESS ANAL  04/2002, 02/1999 and 9/20011   lumber laminectomy  02/2010   SINOSCOPY  1998 and 2019   TONSILLECTOMY     TREATMENT FISTULA ANAL      Current Outpatient Medications on File Prior to Visit  Medication Sig Dispense Refill   acetaminophen (TYLENOL) 500 MG tablet Take by mouth daily.      acyclovir (ZOVIRAX) 400 MG tablet Take by mouth as needed.     albuterol (VENTOLIN HFA) 108 (90 Base) MCG/ACT inhaler Inhale into the lungs.     Atogepant (QULIPTA) 60 MG TABS Take by mouth.     CALCIUM CITRATE-VITAMIN D3 PO Take by mouth daily.     cetirizine (ZYRTEC) 10 MG tablet Take 10 mg by mouth as needed for allergies.     CLONAZEPAM PO Take 5 mg by mouth 3 (three) times daily.     cyclobenzaprine (FLEXERIL) 5 MG tablet Take 5 mg by mouth 3 (three) times daily as needed.     desloratadine (CLARINEX) 5 MG tablet Take by mouth.     diphenhydrAMINE HCl (BENADRYL ALLERGY  PO) Take by mouth as needed.     Docusate Sodium (DSS) 100 MG CAPS Take by mouth at bedtime.     lisdexamfetamine (VYVANSE) 50 MG capsule 1 cap(s) orally once a day (in the morning) for 30 days     meclizine (ANTIVERT) 12.5 MG tablet Take by mouth as needed.     Multiple Vitamin (MULTI-VITAMIN) tablet Take 1 tablet by mouth daily.     pantoprazole (PROTONIX) 40 MG tablet Take 40 mg by mouth daily.     promethazine (PHENERGAN) 25 MG tablet Take 25 mg by mouth 2 (two) times daily as needed.     SENNOSIDES PO Take 500 mg by mouth daily.     sertraline (ZOLOFT) 100 MG tablet Take 1 tablet by mouth daily.     traMADol (ULTRAM) 50 MG tablet Take by mouth daily.     Ferrous Sulfate (IRON PO) Take by mouth daily. (Patient not taking: Reported on 02/26/2022)     fluconazole (DIFLUCAN) 150 MG tablet Take 1 tablet (150 mg total) by mouth every 3 (three) days. For three doses (Patient not taking: Reported on 02/26/2022) 3 tablet 1   Fremanezumab-vfrm (AJOVY) 225 MG/1.5ML SOAJ Inject  into the skin. (Patient not taking: Reported on 02/26/2022)     lidocaine (HM LIDOCAINE PATCH) 4 % Place 1 patch onto the skin daily. (Patient not taking: Reported on 02/26/2022) 15 patch 0   melatonin 5 MG TABS Take by mouth as needed. (Patient not taking: Reported on 02/26/2022)     metroNIDAZOLE (METROGEL VAGINAL) 0.75 % vaginal gel Place 1 Applicatorful vaginally 2 (two) times daily. (Patient not taking: Reported on 02/26/2022) 70 g 2   montelukast (SINGULAIR) 10 MG tablet Take by mouth.     NURTEC 75 MG TBDP Take by mouth.     UNABLE TO FIND Med Name: immunotherapy     No current facility-administered medications on file prior to visit.    Allergies  Allergen Reactions   Quinolones Other (See Comments)    tendonitis   Other reaction(s): Other, Other (See Comments) tendonitis tendonitis Tendinitis tendonitis   Other reaction(s): Other, Other (See Comments) tendonitis tendonitis Tendinitis Other reaction(s): Other, Other (See Comments) tendonitis tendonitis tendonitis Other reaction(s): Other, Other (See Comments) tendonitis tendonitis  Other reaction(s): Other (See Comments), Other (See Comments) Tendinitis tendonitis   Other reaction(s): Other, Other (See Comments) tendonitis tendonitis Tendinitis Other reaction(s): Other, Other (See Comments) tendonitis tendonitis tendonitis tendonitis   Other reaction(s): Other, Other (See Comments) tendonitis tendonitis    Fluticasone Other (See Comments)    Epitaxis  Epitaxis Epitaxis Epitaxis Epitaxis Other reaction(s): Other (See Comments) Epitaxis Epitaxis Epitaxis    Other Diarrhea and Nausea And Vomiting    ALLERGIC mollusk   Tuberculin Ppd Hives   Morphine Nausea Only and Other (See Comments)    Headache  Headache Headache Headache Other reaction(s): Other (See Comments) Headache Headache Other reaction(s): Other (See Comments) Headache  Other reaction(s): GI Upset (intolerance), Other (See  Comments) Headache Headache Headache Other reaction(s): Other (See Comments) Headache    Tape Rash    Social History   Socioeconomic History   Marital status: Married    Spouse name: Not on file   Number of children: Not on file   Years of education: Not on file   Highest education level: Not on file  Occupational History   Not on file  Tobacco Use   Smoking status: Former   Smokeless tobacco: Never  Vaping Use   Vaping Use: Never  used  Substance and Sexual Activity   Alcohol use: Never   Drug use: Never   Sexual activity: Yes    Birth control/protection: Surgical    Comment: Husband had vasectomy  Other Topics Concern   Not on file  Social History Narrative   Not on file   Social Determinants of Health   Financial Resource Strain: Not on file  Food Insecurity: Not on file  Transportation Needs: Not on file  Physical Activity: Not on file  Stress: Not on file  Social Connections: Not on file  Intimate Partner Violence: Not on file    Family History  Problem Relation Age of Onset   Allergic rhinitis Mother    Asthma Mother    Eczema Mother    Urticaria Mother    Allergic rhinitis Father    Asthma Father    Eczema Father    Urticaria Father    Allergic rhinitis Sister    Asthma Sister    Eczema Sister    Urticaria Sister    The following portions of the patient's history were reviewed and updated as appropriate: allergies, current medications, past family history, past medical history, past social history, past surgical history and problem list.  Review of Systems Pertinent items are noted in HPI.   Objective:  BP 126/79   Pulse 66   Ht '5\' 7"'$  (1.702 m)   Wt 144 lb (65.3 kg)   BMI 22.55 kg/m  CONSTITUTIONAL: Well-developed, well-nourished female in no acute distress.  HENT:  Normocephalic, atraumatic, External right and left ear normal. Oropharynx is clear and moist EYES: Conjunctivae and EOM are normal. Pupils are equal, round, and reactive to  light. No scleral icterus.  NECK: Normal range of motion, supple, no masses.  Normal thyroid.  SKIN: Skin is warm and dry. No rash noted. Not diaphoretic. No erythema. No pallor. NEUROLOGIC: Alert and oriented to person, place, and time. Normal reflexes, muscle tone coordination. No cranial nerve deficit noted. PSYCHIATRIC: Normal mood and affect. Normal behavior. Normal judgment and thought content. CARDIOVASCULAR: Normal heart rate noted RESPIRATORY: Effort normal, no problems with respiration noted. BREASTS: Symmetric in size. No masses, skin changes, nipple drainage, or lymphadenopathy. ABDOMEN: Soft, no distention noted.  No tenderness, rebound or guarding.  PELVIC: Normal appearing external genitalia; normal appearing vaginal mucosa and cervix.  No abnormal discharge noted.  Pap smear obtained. Pelvic cultures obtained. Normal uterine size, no other palpable masses, no uterine or adnexal tenderness. MUSCULOSKELETAL: Normal range of motion. No tenderness.  No cyanosis, clubbing, or edema.   Exam done with chaperone present.   Assessment and Plan:   1. Well woman exam Healthy female exam  2. Vaginal irritation Check vaginal swab Will send diflucan prn, pt states she needs two rounds to cover if yeast  3. Intramural leiomyoma of uterus Check [place] as she has been feeling bloating - US PELVIC COMPLETE WITH TRANSVAGINAL; Future  4. Encounter for screening mammogram for malignant neoplasm of breast - MM Digital Screening; Future  5. Cervical cancer screening - Cytology - PAP( [facility])  6. Perimenopause - Reviewed that as it has been 8 months since last period, she is perimenopausal, defn of menopause is 12 months - likely menopausal with elevated FSH but cannot say until no period > 12 months - briefly reviewed HRT and indications  7. Hot flushes - she is having them but tolerating for now   Will follow up results of pap smear/STI screen and manage accordingly. Encouraged  improvement in  diet and exercise.  COVID vaccine  Accepts STI screen. Mammogram ordered today Referral for colonoscopy  Flu vaccine today DEXA not due based on age  Routine preventative health maintenance measures emphasized. Please refer to After Visit Summary for other counseling recommendations.     Feliz Beam, MD, [place] for [employer] [facility])

## 2022-02-27 ENCOUNTER — Encounter: Payer: Self-pay | Admitting: Obstetrics and Gynecology

## 2022-03-05 LAB — CYTOLOGY - PAP
Chlamydia: NEGATIVE
Comment: NEGATIVE
Comment: NEGATIVE
Comment: NEGATIVE
Comment: NEGATIVE
Comment: NEGATIVE
Comment: NORMAL
Diagnosis: UNDETERMINED — AB
HPV 16: POSITIVE — AB
HPV 18 / 45: NEGATIVE
High risk HPV: POSITIVE — AB
Neisseria Gonorrhea: NEGATIVE
Trichomonas: NEGATIVE

## 2022-03-07 ENCOUNTER — Ambulatory Visit (INDEPENDENT_AMBULATORY_CARE_PROVIDER_SITE_OTHER): Payer: BC Managed Care – PPO

## 2022-03-07 ENCOUNTER — Telehealth: Payer: Self-pay | Admitting: *Deleted

## 2022-03-07 DIAGNOSIS — Z1231 Encounter for screening mammogram for malignant neoplasm of breast: Secondary | ICD-10-CM

## 2022-03-07 DIAGNOSIS — D251 Intramural leiomyoma of uterus: Secondary | ICD-10-CM

## 2022-03-07 NOTE — Telephone Encounter (Signed)
-----   Message from Lyndal Rainbow, [place] sent at 03/05/2022  2:17 PM EDT -----  ----- Message ----- From: Donnamae Jude, MD Sent: 03/05/2022   2:08 PM EDT To: [facility] Clinical Pool  Sorry, needs colpo

## 2022-03-07 NOTE — Telephone Encounter (Signed)
Left patient a message to call and schedule. 

## 2022-03-09 ENCOUNTER — Other Ambulatory Visit: Payer: Self-pay | Admitting: Obstetrics and Gynecology

## 2022-03-09 DIAGNOSIS — R928 Other abnormal and inconclusive findings on diagnostic imaging of breast: Secondary | ICD-10-CM

## 2022-03-12 ENCOUNTER — Telehealth: Payer: Self-pay

## 2022-03-12 DIAGNOSIS — B379 Candidiasis, unspecified: Secondary | ICD-10-CM

## 2022-03-12 MED ORDER — FLUCONAZOLE 150 MG PO TABS: 150.0000 mg | ORAL_TABLET | Freq: Once | ORAL | 0 refills | Status: AC

## 2022-03-12 NOTE — Telephone Encounter (Signed)
Pt states she needs Diflucan Rx. Per Dr.Davis last note she states she would send Rx for Diflucan. I do not see where Rx was sent. Rx sent now per Dr.Davis' note.

## 2022-03-13 ENCOUNTER — Other Ambulatory Visit: Payer: Self-pay | Admitting: Obstetrics and Gynecology

## 2022-03-13 DIAGNOSIS — N898 Other specified noninflammatory disorders of vagina: Secondary | ICD-10-CM

## 2022-03-14 ENCOUNTER — Ambulatory Visit
Admission: RE | Admit: 2022-03-14 | Discharge: 2022-03-14 | Disposition: A | Discharge status: Home / Self Care | Payer: BC Managed Care – PPO | Source: Ambulatory Visit | Attending: Obstetrics and Gynecology | Admitting: Obstetrics and Gynecology

## 2022-03-14 ENCOUNTER — Other Ambulatory Visit: Payer: Self-pay | Admitting: Obstetrics and Gynecology

## 2022-03-14 DIAGNOSIS — R928 Other abnormal and inconclusive findings on diagnostic imaging of breast: Secondary | ICD-10-CM

## 2022-03-14 DIAGNOSIS — R921 Mammographic calcification found on diagnostic imaging of breast: Secondary | ICD-10-CM

## 2022-03-19 ENCOUNTER — Other Ambulatory Visit (HOSPITAL_COMMUNITY)
Admission: RE | Admit: 2022-03-19 | Discharge: 2022-03-19 | Disposition: A | Discharge status: Home / Self Care | Payer: BC Managed Care – PPO | Source: Ambulatory Visit | Attending: Obstetrics and Gynecology | Admitting: Obstetrics and Gynecology

## 2022-03-19 ENCOUNTER — Encounter: Payer: Self-pay | Admitting: Obstetrics and Gynecology

## 2022-03-19 ENCOUNTER — Ambulatory Visit: Payer: BC Managed Care – PPO | Admitting: Obstetrics and Gynecology

## 2022-03-19 VITALS — BP 120/74 | HR 84 | Resp 16 | Ht 67.0 in | Wt 149.0 lb

## 2022-03-19 DIAGNOSIS — R87619 Unspecified abnormal cytological findings in specimens from cervix uteri: Secondary | ICD-10-CM | POA: Diagnosis present

## 2022-03-19 DIAGNOSIS — R87810 Cervical high risk human papillomavirus (HPV) DNA test positive: Secondary | ICD-10-CM

## 2022-03-19 DIAGNOSIS — R87610 Atypical squamous cells of undetermined significance on cytologic smear of cervix (ASC-US): Secondary | ICD-10-CM

## 2022-03-19 DIAGNOSIS — R140 Abdominal distension (gaseous): Secondary | ICD-10-CM | POA: Diagnosis not present

## 2022-03-19 DIAGNOSIS — Z01812 Encounter for preprocedural laboratory examination: Secondary | ICD-10-CM | POA: Diagnosis not present

## 2022-03-19 LAB — POCT URINE PREGNANCY: Preg Test, Ur: NEGATIVE

## 2022-03-19 NOTE — Progress Notes (Signed)
Colposcopy Procedure Note  Renee Velasquez is a 46 y.o. [id number] here for colposcopy.  Indications:  Positive HPV 16, ASCUS  Procedure Details  LMP n/a; UPT neg.    The risks (including infection, bleeding, pain) and benefits of the procedure were explained to the patient and written informed consent was obtained.  The patient was placed in the dorsal lithotomy position. A Graves was speculum inserted in the vagina, and the cervix was visualized.  The cervix was stained with acetic acid and visualized using the colposcope under magnification as well as with a green filter. Findings as below. Cervical biopsies were attempted however due to small os, despite dilation, unable to take proper biopsy. Scraping sent. Endocervical curettage then performed in all four quadrants. Small amount of bleeding noted that improved with pressure. Patient tolerating procedure well.  Findings: no acetowhite uptake  Impression: low grade  Adequate: yes  Specimens:  Cervical biopsy scraping Endocervical curetttage  Condition: Stable  Complications: None  Plan: The patient was advised to call for any fever or for prolonged or severe pain or bleeding. She was advised to use OTC analgesics as needed for mild to moderate pain. She was advised to avoid vaginal intercourse for 48 hours or until the bleeding has completely stopped.  Will base further management on results of biopsy.   Feliz Beam, MD, [place] for [employer] [facility])

## 2022-03-19 NOTE — Progress Notes (Signed)
GYNECOLOGY OFFICE FOLLOW UP NOTE  History:  46 y.o. [id number] here today for follow up for bloating and [place] results. Feels more bloated than before, feels like she is 4-5 months pregnant based on bloating.   Past Medical History:  Diagnosis Date   Achilles tendonitis    Allodynia    Anxiety    Arthritis    Asthma    Brachial plexopathy    CFS (chronic fatigue syndrome)    Chronic joint pain    Dizziness    Dysfunctional autonomic nervous system    Eczema    Ehlers-Danlos syndrome    GERD (gastroesophageal reflux disease)    Hernia, hiatal    Hypoglycemia    Hypotension    IBS (irritable bowel syndrome)    IBS (irritable bowel syndrome)    ME (myalgic encephalomyelitis)    Migraines    Nerve pain    Obesity    PMDD (premenstrual dysphoric disorder)    Recurrent upper respiratory infection (URI)    SVT (supraventricular tachycardia)    Urticaria    Vestibular migraine     Past Surgical History:  Procedure Laterality Date   ADENOIDECTOMY     CESAREAN SECTION  2008 and 20010   GASTRECTOMY  08/2016   HEMORROIDECTOMY     INCISION AND DRAINAGE ABSCESS ANAL  04/2002, 02/1999 and 9/20011   lumber laminectomy  02/2010   SINOSCOPY  1998 and 2019   TONSILLECTOMY     TREATMENT FISTULA ANAL       Current Outpatient Medications:    acetaminophen (TYLENOL) 500 MG tablet, Take by mouth daily. , Disp: , Rfl:    acyclovir (ZOVIRAX) 400 MG tablet, Take by mouth as needed., Disp: , Rfl:    albuterol (VENTOLIN HFA) 108 (90 Base) MCG/ACT inhaler, Inhale into the lungs., Disp: , Rfl:    Atogepant (QULIPTA) 60 MG TABS, Take by mouth., Disp: , Rfl:    CALCIUM CITRATE-VITAMIN D3 PO, Take by mouth daily., Disp: , Rfl:    cetirizine (ZYRTEC) 10 MG tablet, Take 10 mg by mouth as needed for allergies., Disp: , Rfl:    CLONAZEPAM PO, Take 5 mg by mouth 3 (three) times daily., Disp: , Rfl:    cyclobenzaprine (FLEXERIL) 5 MG tablet, Take 5 mg by mouth 3 (three) times daily as needed., Disp: ,  Rfl:    desloratadine (CLARINEX) 5 MG tablet, Take by mouth., Disp: , Rfl:    lisdexamfetamine (VYVANSE) 50 MG capsule, 1 cap(s) orally once a day (in the morning) for 30 days, Disp: , Rfl:    meclizine (ANTIVERT) 12.5 MG tablet, Take by mouth as needed., Disp: , Rfl:    Multiple Vitamin (MULTI-VITAMIN) tablet, Take 1 tablet by mouth daily., Disp: , Rfl:    NURTEC 75 MG TBDP, Take by mouth., Disp: , Rfl:    pantoprazole (PROTONIX) 40 MG tablet, Take 40 mg by mouth daily., Disp: , Rfl:    promethazine (PHENERGAN) 25 MG tablet, Take 25 mg by mouth 2 (two) times daily as needed., Disp: , Rfl:    SENNOSIDES PO, Take 500 mg by mouth daily., Disp: , Rfl:    sertraline (ZOLOFT) 100 MG tablet, Take 1 tablet by mouth daily., Disp: , Rfl:    traMADol (ULTRAM) 50 MG tablet, Take by mouth daily., Disp: , Rfl:    UNABLE TO FIND, Med Name: immunotherapy, Disp: , Rfl:    diphenhydrAMINE HCl (BENADRYL ALLERGY PO), Take by mouth as needed. (Patient not taking: Reported on 03/19/2022),  Disp: , Rfl:    Docusate Sodium (DSS) 100 MG CAPS, Take by mouth at bedtime. (Patient not taking: Reported on 03/19/2022), Disp: , Rfl:    Fremanezumab-vfrm (AJOVY) 225 MG/1.5ML SOAJ, Inject into the skin. (Patient not taking: Reported on 02/26/2022), Disp: , Rfl:    lidocaine (HM LIDOCAINE PATCH) 4 %, Place 1 patch onto the skin daily. (Patient not taking: Reported on 02/26/2022), Disp: 15 patch, Rfl: 0   melatonin 5 MG TABS, Take by mouth as needed. (Patient not taking: Reported on 02/26/2022), Disp: , Rfl:    metroNIDAZOLE (METROGEL VAGINAL) 0.75 % vaginal gel, Place 1 Applicatorful vaginally 2 (two) times daily. (Patient not taking: Reported on 02/26/2022), Disp: 70 g, Rfl: 2   montelukast (SINGULAIR) 10 MG tablet, Take by mouth., Disp: , Rfl:   The following portions of the patient's history were reviewed and updated as appropriate: allergies, current medications, past family history, past medical history, past social history, past  surgical history and problem list.   Review of Systems:  Pertinent items noted in HPI and remainder of comprehensive ROS otherwise negative.   Objective:  Physical Exam BP 120/74   Pulse 84   Resp 16   Ht '5\' 7"'$  (1.702 m)   Wt 149 lb (67.6 kg)   LMP 06/14/2021 (Approximate)   BMI 23.34 kg/m  CONSTITUTIONAL: Well-developed, well-nourished female in no acute distress.  HENT:  Normocephalic, atraumatic. External right and left ear normal. Oropharynx is clear and moist EYES: Conjunctivae and EOM are normal. Pupils are equal, round, and reactive to light. No scleral icterus.  NECK: Normal range of motion, supple, no masses SKIN: Skin is warm and dry. No rash noted. Not diaphoretic. No erythema. No pallor. NEUROLOGIC: Alert and oriented to person, place, and time. Normal reflexes, muscle tone coordination. No cranial nerve deficit noted. PSYCHIATRIC: Normal mood and affect. Normal behavior. Normal judgment and thought content. CARDIOVASCULAR: Normal heart rate noted RESPIRATORY: Effort normal, no problems with respiration noted ABDOMEN: Soft, no distention noted.   PELVIC: Normal appearing external genitalia; normal appearing vaginal mucosa and cervix.  No abnormal discharge noted.  Colpo performed, see note MUSCULOSKELETAL: Normal range of motion. No edema noted.  Exam done with chaperone present.  Labs and Imaging   Assessment & Plan:   1. Pre-procedure lab exam - POCT urine pregnancy  2. Abnormal cervical Papanicolaou smear, unspecified abnormal pap finding See colpo - Surgical pathology( [facility])  3. Bloating Reviewed [place] results, would not anticipate significant bloating from [place] with minimal fibroids - will review in detail at upcoming visit  Routine preventative health maintenance measures emphasized. Please refer to After Visit Summary for other counseling recommendations.   Return in about 4 weeks (around 04/16/2022).   Feliz Beam, MD,  [place] for [employer] [facility])

## 2022-03-21 LAB — SURGICAL PATHOLOGY

## 2022-03-27 ENCOUNTER — Ambulatory Visit
Admission: RE | Admit: 2022-03-27 | Discharge: 2022-03-27 | Disposition: A | Discharge status: Home / Self Care | Payer: BC Managed Care – PPO | Source: Ambulatory Visit | Attending: Obstetrics and Gynecology | Admitting: Obstetrics and Gynecology

## 2022-03-27 DIAGNOSIS — R921 Mammographic calcification found on diagnostic imaging of breast: Secondary | ICD-10-CM

## 2022-04-18 NOTE — Progress Notes (Unsigned)
GYNECOLOGY OFFICE VISIT NOTE  History:   Renee Velasquez is a 46 y.o. [id number] here today for follow up from [place] and from bleeding. She has not had any bleeding in the last 8 months. She thinks she is perimenopausal and that is the cause of her bloating as she also has hot flashes and night sweats. She is interested in medical management of her hot flashes especially because she knows from her Ehlers-Danlos she is at increased risk of osteporosis.     Past Medical History:  Diagnosis Date   Achilles tendonitis    Allodynia    Anxiety    Arthritis    Asthma    Brachial plexopathy    CFS (chronic fatigue syndrome)    Chronic joint pain    Dizziness    Dysfunctional autonomic nervous system    Eczema    Ehlers-Danlos syndrome    GERD (gastroesophageal reflux disease)    Hernia, hiatal    Hypoglycemia    Hypotension    IBS (irritable bowel syndrome)    IBS (irritable bowel syndrome)    ME (myalgic encephalomyelitis)    Migraines    Nerve pain    Obesity    PMDD (premenstrual dysphoric disorder)    Recurrent upper respiratory infection (URI)    SVT (supraventricular tachycardia)    Urticaria    Vestibular migraine     Past Surgical History:  Procedure Laterality Date   ADENOIDECTOMY     CESAREAN SECTION  2008 and 20010   GASTRECTOMY  08/2016   HEMORROIDECTOMY     INCISION AND DRAINAGE ABSCESS ANAL  04/2002, 02/1999 and 9/20011   lumber laminectomy  02/2010   SINOSCOPY  1998 and 2019   TONSILLECTOMY     TREATMENT FISTULA ANAL      The following portions of the patient's history were reviewed and updated as appropriate: allergies, current medications, past family history, past medical history, past social history, past surgical history and problem list.   Health Maintenance:    Diagnosis  Date Value Ref Range Status  02/26/2022 (A)  Final   - Atypical squamous cells of undetermined significance (ASC-US)   Colpo c/w CIN1.   Review of Systems:  Pertinent items  noted in HPI and remainder of comprehensive ROS otherwise negative.  Physical Exam:  BP 129/77   Pulse (!) 106   Ht '5\' 7"'$  (1.702 m)   Wt 144 lb (65.3 kg)   BMI 22.55 kg/m  CONSTITUTIONAL: Well-developed, well-nourished female in no acute distress.  HEENT:  Normocephalic, atraumatic. External right and left ear normal. No scleral icterus.  NECK: Normal range of motion, supple, no masses noted on observation SKIN: No rash noted. Not diaphoretic. No erythema. No pallor. MUSCULOSKELETAL: Normal range of motion. No edema noted. NEUROLOGIC: Alert and oriented to person, place, and time. Normal muscle tone coordination. No cranial nerve deficit noted. PSYCHIATRIC: Normal mood and affect. Normal behavior. Normal judgment and thought content.  PELVIC: Deferred  Labs and Imaging She had an [place] on 10/4 - report:  Multiple small uterine leiomyomata.   Cannot exclude 10 mm endometrial polyp at upper uterine segment; consider sonohysterogram for further evaluation, prior to hysteroscopy or endometrial biopsy.   Remainder of exam unremarkable. Assessment and Plan:   1. Menometrorrhagia Discussed possibility of endometrial polyp and that to diagnose this I would not do EMB but would do SHG or D&C, hysteroscopy. She may also do expectant management to see if she has bleeding - in that event  that I would recommend one of those options to diagnose her officially and then proceed from there. I think the best one [facility] be [facility] and would recommend that if she has bleeding. I think expectant management is reasonable in light of no bleeding for 8 months.   2. Hot flashes due to menopause - We discussed the treatment options for menopause as well as indications - we discussed both HRT and non-HRT.  - Discussed the benefits of each and relative effectiveness.  - Discussed if HRT we do shortest amount of time at lowest dose. We discussed annual attempts at coming of the HRT typically in the fall months  when the weather has cooled down. - We discussed the differences in modes of therapy for HRT -- patch vs oral therapy.  - Discussed risks of HRT: E+P = breast cancer, clotting, MI/Stroke. Discussed risk of E alone. We reviewed that limits of data from the [facility] regarding breast cancer impact - only progesterone used in that study was provera which is biologically active in the best. We MAY reduce that risk by doing a different progesterone based therapy I.e. norethindrone. We reviewed the indication and necessity for progesterone and that estrogen alone in those with a uterus have an increased risk of endometrial cancer - She would like: HRT. She would like whatever is covered by insurance. If patch isn't covered (insurance report says it is), then would do E2 and prometrium vs jinteli.  - estradiol-levonorgestrel (CLIMARA PRO) 0.045-0.015 MG/DAY; Place 1 patch onto the skin once a week.  Dispense: 12 patch; Refill: 3   Routine preventative health maintenance measures emphasized. Please refer to After Visit Summary for other counseling recommendations.   No follow-ups on file.  Radene Gunning, MD, [address] for [facility], [place]

## 2022-04-19 ENCOUNTER — Encounter: Payer: Self-pay | Admitting: Obstetrics and Gynecology

## 2022-04-19 ENCOUNTER — Ambulatory Visit (INDEPENDENT_AMBULATORY_CARE_PROVIDER_SITE_OTHER): Payer: BC Managed Care – PPO | Admitting: Obstetrics and Gynecology

## 2022-04-19 VITALS — BP 129/77 | HR 106 | Ht 67.0 in | Wt 144.0 lb

## 2022-04-19 DIAGNOSIS — N921 Excessive and frequent menstruation with irregular cycle: Secondary | ICD-10-CM | POA: Diagnosis not present

## 2022-04-19 DIAGNOSIS — N951 Menopausal and female climacteric states: Secondary | ICD-10-CM

## 2022-04-19 MED ORDER — CLIMARA PRO 0.045-0.015 MG/DAY TD PTWK: 1.0000 | MEDICATED_PATCH | TRANSDERMAL | 3 refills | Status: DC

## 2022-05-21 ENCOUNTER — Ambulatory Visit (INDEPENDENT_AMBULATORY_CARE_PROVIDER_SITE_OTHER): Payer: BC Managed Care – PPO

## 2022-05-21 ENCOUNTER — Other Ambulatory Visit (HOSPITAL_COMMUNITY)
Admission: RE | Admit: 2022-05-21 | Discharge: 2022-05-21 | Disposition: A | Discharge status: Home / Self Care | Payer: BC Managed Care – PPO | Source: Ambulatory Visit | Attending: Obstetrics and Gynecology | Admitting: Obstetrics and Gynecology

## 2022-05-21 VITALS — BP 112/75 | HR 73

## 2022-05-21 DIAGNOSIS — N898 Other specified noninflammatory disorders of vagina: Secondary | ICD-10-CM | POA: Insufficient documentation

## 2022-05-21 NOTE — Progress Notes (Signed)
SUBJECTIVE:  46 y.o. female complains of vaginal irritation for 3 days. Denies abnormal vaginal bleeding or significant pelvic pain or fever. No UTI symptoms. Denies history of known exposure to STD.  No LMP recorded. (Menstrual status: Perimenopausal).  OBJECTIVE:  She appears well, afebrile. Urine dipstick: not done.  ASSESSMENT:  Vaginal irritation      PLAN:  BVAG, CVAG probe sent to lab. Treatment: To be determined once lab results are received ROV prn if symptoms persist or worsen.

## 2022-05-22 LAB — CERVICOVAGINAL ANCILLARY ONLY
Bacterial Vaginitis (gardnerella): NEGATIVE
Candida Glabrata: NEGATIVE
Candida Vaginitis: NEGATIVE
Comment: NEGATIVE
Comment: NEGATIVE
Comment: NEGATIVE

## 2022-11-09 DIAGNOSIS — G43909 Migraine, unspecified, not intractable, without status migrainosus: Secondary | ICD-10-CM | POA: Insufficient documentation

## 2023-02-05 ENCOUNTER — Other Ambulatory Visit (HOSPITAL_COMMUNITY)
Admission: RE | Admit: 2023-02-05 | Discharge: 2023-02-05 | Disposition: A | Discharge status: Home / Self Care | Payer: BC Managed Care – PPO | Source: Ambulatory Visit | Attending: Obstetrics and Gynecology | Admitting: Obstetrics and Gynecology

## 2023-02-05 ENCOUNTER — Ambulatory Visit (INDEPENDENT_AMBULATORY_CARE_PROVIDER_SITE_OTHER): Payer: BC Managed Care – PPO

## 2023-02-05 DIAGNOSIS — B379 Candidiasis, unspecified: Secondary | ICD-10-CM

## 2023-02-05 DIAGNOSIS — B3731 Acute candidiasis of vulva and vagina: Secondary | ICD-10-CM | POA: Insufficient documentation

## 2023-02-05 DIAGNOSIS — Z113 Encounter for screening for infections with a predominantly sexual mode of transmission: Secondary | ICD-10-CM | POA: Insufficient documentation

## 2023-02-05 DIAGNOSIS — N898 Other specified noninflammatory disorders of vagina: Secondary | ICD-10-CM | POA: Diagnosis present

## 2023-02-05 DIAGNOSIS — L292 Pruritus vulvae: Secondary | ICD-10-CM | POA: Insufficient documentation

## 2023-02-05 NOTE — Progress Notes (Signed)
Pt here for self swab due to vaginal irritation and burning. Pt is aware she will be contacted with results.

## 2023-02-07 LAB — CERVICOVAGINAL ANCILLARY ONLY
Bacterial Vaginitis (gardnerella): NEGATIVE
Candida Glabrata: NEGATIVE
Candida Vaginitis: POSITIVE — AB
Comment: NEGATIVE
Comment: NEGATIVE
Comment: NEGATIVE

## 2023-02-07 MED ORDER — FLUCONAZOLE 150 MG PO TABS: 150.0000 mg | ORAL_TABLET | Freq: Once | ORAL | 1 refills | Status: AC

## 2023-02-07 NOTE — Addendum Note (Signed)
Addended by: Sue Lush on: 02/07/2023 09:03 AM   Modules accepted: Orders

## 2023-02-13 ENCOUNTER — Other Ambulatory Visit: Payer: Self-pay | Admitting: *Deleted

## 2023-02-13 MED ORDER — FLUCONAZOLE 150 MG PO TABS: ORAL_TABLET | ORAL | 0 refills | Status: DC

## 2023-02-13 NOTE — Progress Notes (Signed)
Pt tested positive for vaginal yeast last week and only received 1 Diflucan.  She states she usually get 3 and takes 1 every 3 days.  She is going out of town and is requesting the other 2 Diflucan be sent into her pharmacy.

## 2023-02-20 ENCOUNTER — Other Ambulatory Visit (HOSPITAL_COMMUNITY)
Admission: RE | Admit: 2023-02-20 | Discharge: 2023-02-20 | Disposition: A | Discharge status: Home / Self Care | Payer: BC Managed Care – PPO | Source: Ambulatory Visit | Attending: Obstetrics and Gynecology | Admitting: Obstetrics and Gynecology

## 2023-02-20 ENCOUNTER — Encounter: Payer: Self-pay | Admitting: Obstetrics and Gynecology

## 2023-02-20 ENCOUNTER — Ambulatory Visit: Payer: BC Managed Care – PPO | Admitting: Obstetrics and Gynecology

## 2023-02-20 VITALS — BP 125/76 | HR 72 | Resp 16 | Ht 67.0 in | Wt 146.0 lb

## 2023-02-20 DIAGNOSIS — N950 Postmenopausal bleeding: Secondary | ICD-10-CM | POA: Diagnosis present

## 2023-02-20 DIAGNOSIS — R87619 Unspecified abnormal cytological findings in specimens from cervix uteri: Secondary | ICD-10-CM | POA: Diagnosis not present

## 2023-02-20 DIAGNOSIS — N920 Excessive and frequent menstruation with regular cycle: Secondary | ICD-10-CM

## 2023-02-20 NOTE — Progress Notes (Signed)
GYNECOLOGY OFFICE VISIT NOTE  History:   Renee Velasquez is a 47 y.o. [id number] here today for PMB.   Discussed the use of AI scribe software for clinical note transcription with the patient, who gave verbal consent to proceed.  History of Present Illness   The patient, on hormone replacement therapy with Climara Pro, presents with abnormal uterine bleeding. She reports a history of intermittent medication availability, resulting in sporadic use. She describes a recent episode of spotting, followed by the passage of a large clot and subsequent bright red bleeding. She denies any associated cramping. She has a history of fibroids, ovarian cysts, and a possible polyp.      The clot was the size of a half dollar and has continued smaller clots since it started. She is thinking it could be related to her daughter getting her period as she had PMS symptoms prior and breast tenderness prior. It has been about 1.5 years since her last period.   She denies any abnormal vaginal discharge, bleeding, pelvic pain or other concerns.     Past Medical History:  Diagnosis Date   Achilles tendonitis    Allodynia    Anxiety    Arthritis    Asthma    Brachial plexopathy    CFS (chronic fatigue syndrome)    Chronic joint pain    Dizziness    Dysfunctional autonomic nervous system    Eczema    Ehlers-Danlos syndrome    GERD (gastroesophageal reflux disease)    Hernia, hiatal    Hypoglycemia    Hypotension    IBS (irritable bowel syndrome)    IBS (irritable bowel syndrome)    ME (myalgic encephalomyelitis)    Migraines    Nerve pain    Obesity    PMDD (premenstrual dysphoric disorder)    Recurrent upper respiratory infection (URI)    SVT (supraventricular tachycardia)    Urticaria    Vestibular migraine     Past Surgical History:  Procedure Laterality Date   ADENOIDECTOMY     CESAREAN SECTION  2008 and 45409   GASTRECTOMY  08/2016   HEMORROIDECTOMY     INCISION AND DRAINAGE ABSCESS  ANAL  04/2002, 02/1999 and 9/20011   lumber laminectomy  02/2010   SINOSCOPY  1998 and 2019   TONSILLECTOMY     TREATMENT FISTULA ANAL      The following portions of the patient's history were reviewed and updated as appropriate: allergies, current medications, past family history, past medical history, past social history, past surgical history and problem list.   Health Maintenance:   Diagnosis  Date Value Ref Range Status  02/26/2022 (A)  Final   - Atypical squamous cells of undetermined significance (ASC-US)   Review of Systems:  Pertinent items noted in HPI and remainder of comprehensive ROS otherwise negative.  Physical Exam:  BP 125/76   Pulse 72   Resp 16   Ht 5\' 7"  (1.702 m)   Wt 146 lb (66.2 kg)   LMP 02/18/2023   BMI 22.87 kg/m  CONSTITUTIONAL: Well-developed, well-nourished female in no acute distress.  HEENT:  Normocephalic, atraumatic. External right and left ear normal. No scleral icterus.  NECK: Normal range of motion, supple, no masses noted on observation SKIN: No rash noted. Not diaphoretic. No erythema. No pallor. MUSCULOSKELETAL: Normal range of motion. No edema noted. NEUROLOGIC: Alert and oriented to person, place, and time. Normal muscle tone coordination. No cranial nerve deficit noted. PSYCHIATRIC: Normal mood and affect. Normal behavior. Normal  judgment and thought content.   PELVIC: Normal appearing external genitalia; normal urethral meatus; normal appearing vaginal mucosa and cervix.  No abnormal discharge noted but bleeding noted coming from os.  Normal uterine size, no other palpable masses, no uterine or adnexal tenderness. Performed in the presence of a chaperone   ENDOMETRIAL BIOPSY     The indications for endometrial biopsy were reviewed.   Risks of the biopsy including cramping, bleeding, infection, uterine perforation, inadequate specimen and need for additional procedures were discussed. Offered alternative of hysteroscopy, dilation and  curettage in OR. The patient states she understands the R/B/I/A and agrees to undergo procedure today. Urine pregnancy test was Not indicated. Consent was signed. Time out was performed.    Patient was positioned in dorsal lithotomy position. A vaginal speculum was placed.  The cervix was visualized and was prepped with Betadine.  A single-toothed tenaculum was placed on the anterior lip of the cervix to stabilize it. The 3 mm pipelle was easily introduced into the endometrial cavity without difficulty to a depth of 8 cm, and a Scant amount of tissue was obtained after two passes and sent to pathology. The instruments were removed from the patient's vagina. Minimal bleeding from the cervix was noted. The patient tolerated the procedure well.  Assessment and Plan:  Assessment and Plan    Postmenopausal bleeding New onset of vaginal bleeding after one year of amenorrhea in a patient on hormone replacement therapy (Climara Pro). Noted passage of a large clot and ongoing bright red bleeding. No associated cramping. History of fibroids and possible polyp. -Perform endometrial biopsy today to rule out cancerous or precancerous conditions. -Perform Pap smear as patient is due. -Consider ultrasound or D&C hysteroscopy based on biopsy results.  Uterine fibroids and possible polyp History of multiple fibroids and a possible polyp. No current symptoms related to these findings. -Plan for follow-up ultrasound based on results of endometrial biopsy.  Hormone Replacement Therapy Patient on Climara Pro with intermittent use due to pharmacy supply issues. -Continue Climara Pro as prescribed.       Orders for today: -     Cytology - PAP -     Surgical pathology   No orders of the defined types were placed in this encounter.    Routine preventative health maintenance measures emphasized. Please refer to After Visit Summary for other counseling recommendations.   No follow-ups on file.  Milas Hock, MD, FACOG Obstetrician & Gynecologist, [facility] for [facility], [facility]

## 2023-02-21 LAB — SURGICAL PATHOLOGY

## 2023-02-25 ENCOUNTER — Ambulatory Visit (INDEPENDENT_AMBULATORY_CARE_PROVIDER_SITE_OTHER): Payer: BC Managed Care – PPO

## 2023-02-25 DIAGNOSIS — N950 Postmenopausal bleeding: Secondary | ICD-10-CM | POA: Diagnosis not present

## 2023-02-27 LAB — CYTOLOGY - PAP
Adequacy: ABSENT
Comment: NEGATIVE
Comment: NEGATIVE
Comment: NEGATIVE
Diagnosis: NEGATIVE
HPV 16: NEGATIVE
HPV 18 / 45: NEGATIVE
High risk HPV: POSITIVE — AB

## 2023-02-28 ENCOUNTER — Other Ambulatory Visit: Payer: Self-pay | Admitting: Obstetrics and Gynecology

## 2023-02-28 DIAGNOSIS — N951 Menopausal and female climacteric states: Secondary | ICD-10-CM

## 2023-02-28 MED ORDER — ESTRADIOL-NORETHINDRONE ACET 0.5-0.1 MG PO TABS: 1.0000 | ORAL_TABLET | Freq: Every day | ORAL | 12 refills | Status: DC

## 2023-03-03 ENCOUNTER — Encounter: Payer: Self-pay | Admitting: Obstetrics and Gynecology

## 2023-03-20 NOTE — Progress Notes (Unsigned)
    GYNECOLOGY OFFICE COLPOSCOPY PROCEDURE NOTE  47 y.o. [id number] here for colposcopy. 11/2019, normal pap, no hpv done 02/2022: ascus/hpv 16pos >neg colpo w/bx 02/2023: nl/HPV pos, non 16/18  Pregnancy test:  negative  Informed consent and review of risks, benefit and alternatives performed. Written consent given.   Speculum inserted into patient's vagina assuring full view of cervix and vaginal walls. 3 swabs of vinegar solution applied to the cervix and vaginal walls and colposcope was used to observe both the cervix and vaginal walls.   Colposcopy adequate? No  no visible lesions, no mosaicism, no punctation, and no abnormal vasculature. No AWE;   ECC collected.  ECC sent to pathology.  Speculum removed.  Pt tolerated well with minimal pain and bleeding.   We also discussed if ongoing bleeding, would do D&C, hysteroscopy for the polyp/fibroid which is the likely cause of her bleeding. She will let me know if it continues.   She also has not felt as well on the oral HRT as compared to the patches (but had allergic rxn to the adhesive). We will try E2 and prometrium 100 mg daily. If this doesn't work, will do cyclic prometrium. If that doesn't work, she would like to try the patches again.   Patient was given post procedure instructions.  Will follow up pathology and manage accordingly; patient will be contacted with results and recommendations.  Routine preventative health maintenance measures emphasized.   Milas Hock, MD, FACOG Obstetrician & Gynecologist, [facility] for [facility], [facility]

## 2023-03-21 ENCOUNTER — Other Ambulatory Visit (HOSPITAL_COMMUNITY)
Admission: RE | Admit: 2023-03-21 | Discharge: 2023-03-21 | Disposition: A | Discharge status: Home / Self Care | Payer: BC Managed Care – PPO | Source: Ambulatory Visit | Attending: Obstetrics and Gynecology | Admitting: Obstetrics and Gynecology

## 2023-03-21 ENCOUNTER — Encounter: Payer: Self-pay | Admitting: Obstetrics and Gynecology

## 2023-03-21 ENCOUNTER — Ambulatory Visit (INDEPENDENT_AMBULATORY_CARE_PROVIDER_SITE_OTHER): Payer: BC Managed Care – PPO | Admitting: Obstetrics and Gynecology

## 2023-03-21 VITALS — BP 159/83 | HR 92 | Wt 143.0 lb

## 2023-03-21 DIAGNOSIS — Z3202 Encounter for pregnancy test, result negative: Secondary | ICD-10-CM | POA: Diagnosis not present

## 2023-03-21 DIAGNOSIS — R87810 Cervical high risk human papillomavirus (HPV) DNA test positive: Secondary | ICD-10-CM

## 2023-03-21 DIAGNOSIS — N951 Menopausal and female climacteric states: Secondary | ICD-10-CM

## 2023-03-21 DIAGNOSIS — R87619 Unspecified abnormal cytological findings in specimens from cervix uteri: Secondary | ICD-10-CM | POA: Diagnosis present

## 2023-03-21 LAB — POCT URINE PREGNANCY: Preg Test, Ur: NEGATIVE

## 2023-03-21 MED ORDER — ESTRADIOL 1 MG PO TABS
1.0000 mg | ORAL_TABLET | Freq: Every day | ORAL | 12 refills | Status: DC
Start: 2023-03-21 — End: 2023-03-25

## 2023-03-21 MED ORDER — PROGESTERONE MICRONIZED 100 MG PO CAPS
100.0000 mg | ORAL_CAPSULE | Freq: Every day | ORAL | 12 refills | Status: DC
Start: 2023-03-21 — End: 2023-03-25

## 2023-03-21 NOTE — Addendum Note (Signed)
Addended by: Milas Hock A on: 03/21/2023 11:23 AM   Modules accepted: Orders

## 2023-03-25 LAB — SURGICAL PATHOLOGY

## 2023-03-25 MED ORDER — CLIMARA PRO 0.045-0.015 MG/DAY TD PTWK
1.0000 | MEDICATED_PATCH | TRANSDERMAL | 3 refills | Status: DC
Start: 2023-03-25 — End: 2024-02-13

## 2023-04-17 ENCOUNTER — Other Ambulatory Visit: Payer: Self-pay | Admitting: Obstetrics and Gynecology

## 2023-04-17 DIAGNOSIS — N951 Menopausal and female climacteric states: Secondary | ICD-10-CM

## 2023-06-16 ENCOUNTER — Other Ambulatory Visit: Payer: Self-pay

## 2023-06-16 ENCOUNTER — Ambulatory Visit
Admission: EM | Admit: 2023-06-16 | Discharge: 2023-06-16 | Disposition: A | Discharge status: Home / Self Care | Payer: BC Managed Care – PPO | Attending: Family Medicine | Admitting: Family Medicine

## 2023-06-16 ENCOUNTER — Telehealth: Payer: Self-pay

## 2023-06-16 DIAGNOSIS — Z760 Encounter for issue of repeat prescription: Secondary | ICD-10-CM

## 2023-06-16 MED ORDER — TRAMADOL HCL 50 MG PO TABS
50.0000 mg | ORAL_TABLET | Freq: Four times a day (QID) | ORAL | 0 refills | Status: AC | PRN
Start: 2023-06-16 — End: ?

## 2023-06-16 NOTE — ED Provider Notes (Signed)
 [facility]    CSN: [id number] Arrival date & time: 06/16/23  0954      History   Chief Complaint Chief Complaint  Patient presents with   Medication Refill    HPI Renee Velasquez is a 48 y.o. female.   HPI 48 year old female presents with pain.  Request several days worth of tramadol  due to pain.  PMH significant for Ehlers Danlos syndrome, brachial plexopathies, and chronic joint pain.  Patient reports taking tramadol  50 mg tablet 5 times daily, this is confirmed with current PDMP report.  Past Medical History:  Diagnosis Date   Achilles tendonitis    Allodynia    Anxiety    Arthritis    Asthma    Brachial plexopathy    CFS (chronic fatigue syndrome)    Chronic joint pain    Dizziness    Dysfunctional autonomic nervous system    Eczema    Ehlers-Danlos syndrome    GERD (gastroesophageal reflux disease)    Hernia, hiatal    Hypoglycemia    Hypotension    IBS (irritable bowel syndrome)    IBS (irritable bowel syndrome)    ME (myalgic encephalomyelitis)    Migraines    Nerve pain    Obesity    PMDD (premenstrual dysphoric disorder)    Recurrent upper respiratory infection (URI)    SVT (supraventricular tachycardia) (HCC)    Urticaria    Vestibular migraine     Patient Active Problem List   Diagnosis Date Noted   PTSD (post-traumatic stress disorder) 03/07/2020   Allergic rhinitis 12/21/2019   Asthma, mild intermittent 12/21/2019   History of epistaxis 12/21/2019   Status post bariatric surgery 11/06/2019   GERD (gastroesophageal reflux disease) 11/03/2019   IBS (irritable bowel syndrome) 11/03/2019   POTS (postural orthostatic tachycardia syndrome) 11/03/2019   Chronic joint pain 03/06/2019   Ehlers-Danlos syndrome 03/06/2019   Migraine without aura and without status migrainosus, not intractable 03/06/2019    Past Surgical History:  Procedure Laterality Date   ADENOIDECTOMY     CESAREAN SECTION  2008 and 79989   GASTRECTOMY  08/2016    HEMORROIDECTOMY     INCISION AND DRAINAGE ABSCESS ANAL  04/2002, 02/1999 and 9/20011   lumber laminectomy  02/2010   SINOSCOPY  1998 and 2019   TONSILLECTOMY     TREATMENT FISTULA ANAL      OB History     Gravida  3   Para  2   Term  2   Preterm      AB  1   Living  2      SAB      IAB      Ectopic      Multiple      Live Births  2            Home Medications    Prior to Admission medications   Medication Sig Start Date End Date Taking? Authorizing Provider  acetaminophen (TYLENOL) 500 MG tablet Take by mouth daily.    Yes [provider]  acyclovir (ZOVIRAX) 400 MG tablet Take by mouth as needed.   Yes [provider]  albuterol (VENTOLIN HFA) 108 (90 Base) MCG/ACT inhaler Inhale into the lungs. 04/16/19  Yes [provider]  Atogepant (QULIPTA) 60 MG TABS Take by mouth.   Yes [provider]  CALCIUM CITRATE-VITAMIN D3 PO Take by mouth daily.   Yes [provider]  cetirizine (ZYRTEC) 10 MG tablet Take 10 mg by mouth  as needed for allergies.   Yes [provider]  CLONAZEPAM PO Take 5 mg by mouth 3 (three) times daily.   Yes [provider]  cyclobenzaprine (FLEXERIL) 5 MG tablet Take 5 mg by mouth 3 (three) times daily as needed. 11/18/19  Yes [provider]  desloratadine (CLARINEX) 5 MG tablet Take by mouth. 03/18/19  Yes [provider]  EMGALITY 120 MG/ML SOAJ    Yes [provider]  estradiol -levonorgestrel (CLIMARA  PRO) 0.045-0.015 MG/DAY Place 1 patch onto the skin once a week. 03/25/23  Yes Cleatus Moccasin, MD  ketorolac  (TORADOL ) 30 MG/ML injection Inject 60 mg by intramuscular route. 11/09/22  Yes [provider]  lisdexamfetamine (VYVANSE) 50 MG capsule 1 cap(s) orally once a day (in the morning) for 30 days 03/08/20  Yes [provider]  meclizine (ANTIVERT) 12.5 MG tablet Take by mouth as needed.   Yes [provider]  montelukast  (SINGULAIR) 10 MG tablet Take 1 tablet by mouth daily. 07/10/22  Yes [provider]  Multiple Vitamin (MULTI-VITAMIN) tablet Take 1 tablet by mouth daily.   Yes [provider]  NURTEC 75 MG TBDP Take by mouth. 01/16/22  Yes [provider]  pantoprazole (PROTONIX) 40 MG tablet Take 40 mg by mouth daily. 12/01/19  Yes [provider]  promethazine (PHENERGAN) 25 MG tablet Take 25 mg by mouth 2 (two) times daily as needed. 11/16/19  Yes [provider]  SENNOSIDES PO Take 500 mg by mouth daily.   Yes [provider]  sertraline (ZOLOFT) 100 MG tablet Take 1 tablet by mouth daily. 07/15/19  Yes [provider]  traMADol  (ULTRAM ) 50 MG tablet Take 1 tablet (50 mg total) by mouth every 6 (six) hours as needed. 06/16/23  Yes Teddy Sharper, FNP  UNABLE TO FIND Med Name: immunotherapy   Yes [provider]    Family History Family History  Problem Relation Age of Onset   Allergic rhinitis Mother    Asthma Mother    Eczema Mother    Urticaria Mother    Brain cancer Mother        Died from stroke after radiation   Allergic rhinitis Father    Asthma Father    Eczema Father    Urticaria Father    Melanoma Father    Allergic rhinitis Sister    Asthma Sister    Eczema Sister    Urticaria Sister    Melanoma Sister     Social History Social History   Tobacco Use   Smoking status: Former   Smokeless tobacco: Never  Advertising Account Planner   Vaping status: Never Used  Substance Use Topics   Alcohol use: Never   Drug use: Never     Allergies   Quinolones, Fluticasone, Other, Tuberculin ppd, Morphine, and Tape   Review of Systems Review of Systems   Physical Exam Triage Vital Signs ED Triage Vitals  Encounter Vitals Group     BP      Systolic BP Percentile      Diastolic BP Percentile      Pulse      Resp      Temp      Temp src      SpO2      Weight      Height      Head Circumference      Peak Flow      Pain  Score      Pain Loc  Pain Education      Exclude from Growth Chart    No data found.  Updated Vital Signs BP 114/82 (BP Location: Left Arm)   Pulse 90   Temp 98.2 F (36.8 C) (Oral)   Resp 19   Ht 5' 7 (1.702 m)   Wt 135 lb (61.2 kg)   LMP 02/18/2023   SpO2 100%   BMI 21.14 kg/m    Physical Exam Vitals and nursing note reviewed.  Constitutional:      General: She is not in acute distress.    Appearance: Normal appearance. She is normal weight. She is not ill-appearing.  HENT:     Head: Normocephalic and atraumatic.     Mouth/Throat:     Mouth: Mucous membranes are moist.     Pharynx: Oropharynx is clear.  Eyes:     Extraocular Movements: Extraocular movements intact.     Conjunctiva/sclera: Conjunctivae normal.     Pupils: Pupils are equal, round, and reactive to light.  Cardiovascular:     Rate and Rhythm: Normal rate and regular rhythm.     Pulses: Normal pulses.     Heart sounds: Normal heart sounds.  Pulmonary:     Effort: Pulmonary effort is normal.     Breath sounds: Normal breath sounds. No wheezing or rhonchi.  Musculoskeletal:        General: Normal range of motion.     Cervical back: Normal range of motion and neck supple.  Skin:    General: Skin is warm and dry.  Neurological:     General: No focal deficit present.     Mental Status: She is alert and oriented to person, place, and time. Mental status is at baseline.  Psychiatric:        Mood and Affect: Mood normal.        Behavior: Behavior normal.      UC Treatments / Results  Labs (all labs ordered are listed, but only abnormal results are displayed) Labs Reviewed - No data to display  EKG   Radiology No results found.  Procedures Procedures (including critical care time)  Medications Ordered in UC Medications - No data to display  Initial Impression / Assessment and Plan / UC Course  I have reviewed the triage vital signs and the nursing notes.  Pertinent labs & imaging  results that were available during my care of the patient were reviewed by me and considered in my medical decision making (see chart for details).     MDM: 1.  Medication refill-Rx'd to Tramadol  50 mg tablet: Take 1 tablet every 6 hours, as needed.  PDMP reviewed. Advised patient to take medication as directed.  Encouraged to increase daily water intake to 64 ounces per day.  Patient discharged home, hemodynamically stable. Final Clinical Impressions(s) / UC Diagnoses   Final diagnoses:  Medication refill     Discharge Instructions      Advised patient to take medication as directed.  Encouraged to increase daily water intake to 64 ounces per day.      ED Prescriptions     Medication Sig Dispense Auth. Provider   traMADol  (ULTRAM ) 50 MG tablet Take 1 tablet (50 mg total) by mouth every 6 (six) hours as needed. 20 tablet Renee Vivian, FNP      I have reviewed the PDMP during this encounter.   Teddy Sharper, FNP 06/16/23 1048

## 2023-06-16 NOTE — Discharge Instructions (Addendum)
 Advised patient to take medication as directed.  Encouraged to increase daily water intake to 64 ounces per day.

## 2023-06-16 NOTE — ED Triage Notes (Signed)
 Pt states that she needs a refill on her medication. Pt states that she needs her Tramadol until her next pcp appt for condition that she has.

## 2023-06-27 ENCOUNTER — Other Ambulatory Visit: Payer: Self-pay | Admitting: Family

## 2023-06-27 DIAGNOSIS — Z1231 Encounter for screening mammogram for malignant neoplasm of breast: Secondary | ICD-10-CM

## 2023-07-10 ENCOUNTER — Ambulatory Visit
Admission: RE | Admit: 2023-07-10 | Discharge: 2023-07-10 | Disposition: A | Discharge status: Home / Self Care | Payer: BC Managed Care – PPO | Source: Ambulatory Visit | Attending: Family | Admitting: Family

## 2023-07-10 DIAGNOSIS — Z1231 Encounter for screening mammogram for malignant neoplasm of breast: Secondary | ICD-10-CM

## 2023-10-15 ENCOUNTER — Telehealth: Payer: Self-pay

## 2023-10-15 NOTE — Telephone Encounter (Signed)
 RN returned patient call requesting prescription for Diflucan  for preparation for upcoming cruise as currently on antibiotic prescribed by PCP, currently no symptoms. RN requested patient request prescription from PCP.  Evelia Hipp, RN

## 2023-10-21 IMAGING — CR DG SHOULDER 2+V*R*
3 series · 3 of 3 positions shown · non-contrast
Comparison: None Available.

CLINICAL DATA: Right arm pain/injury

EXAM:
RIGHT SHOULDER - 2+ VIEW

[w shoulder external right]
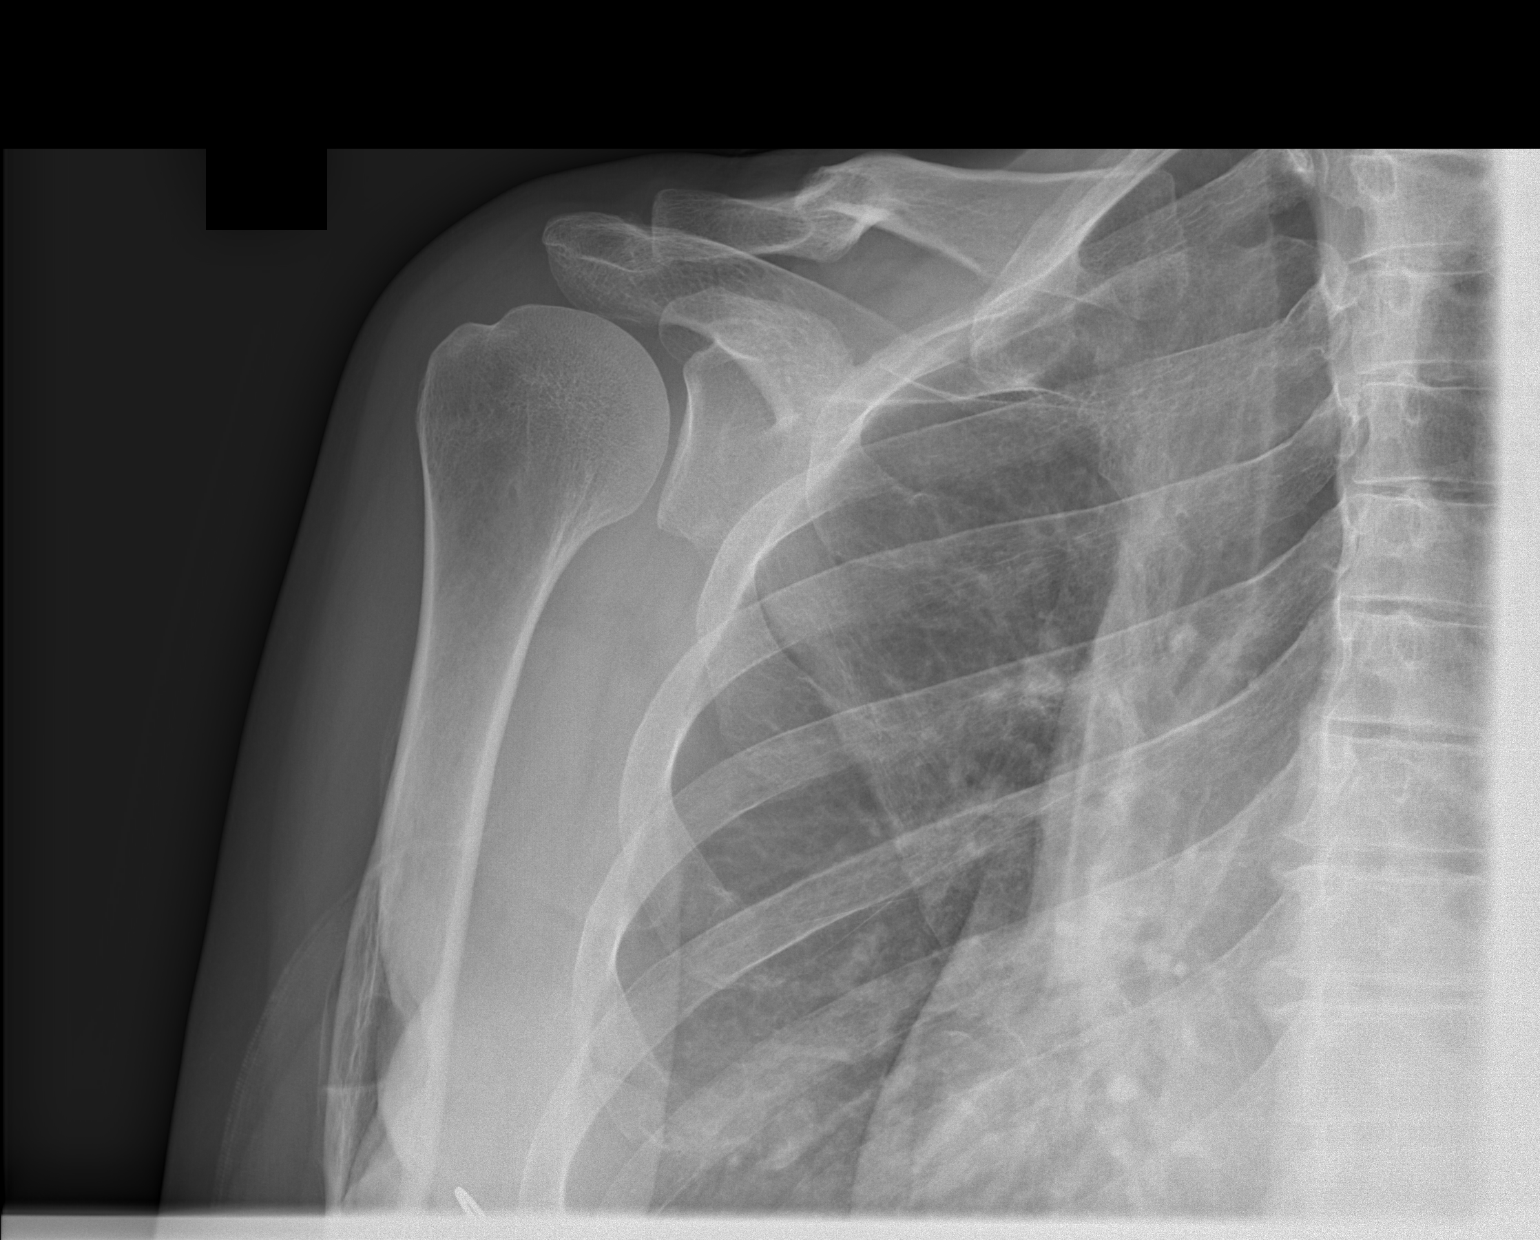

[w shoulder y-view right]
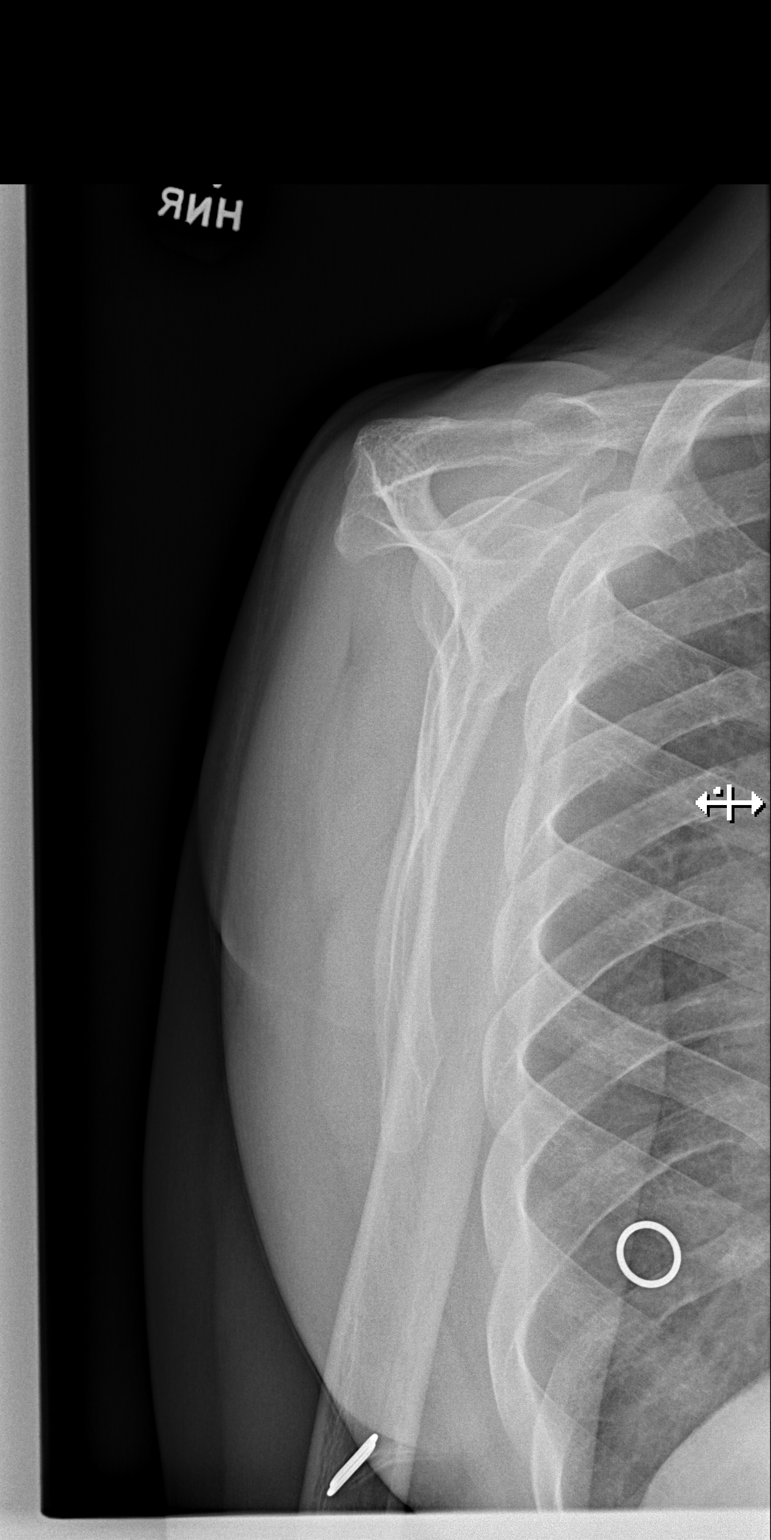

[x shoulder axillary right]
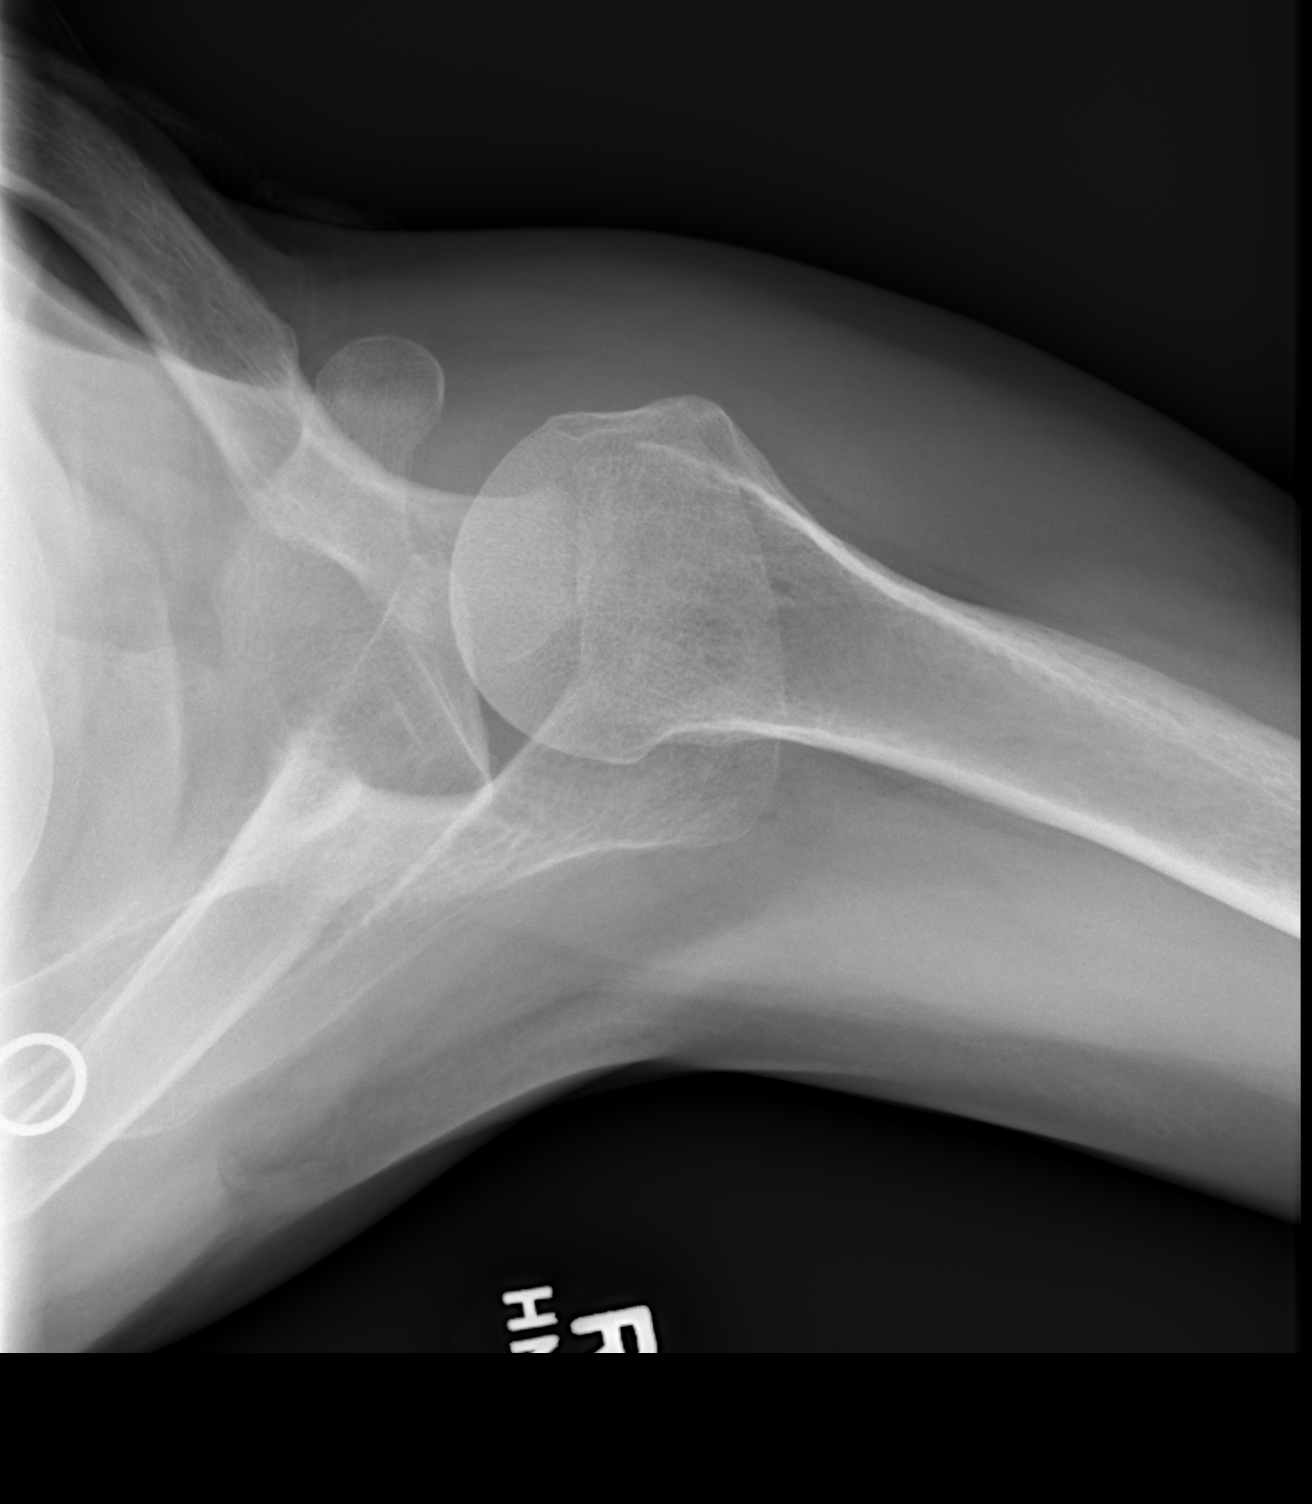

[3 of 3 positions shown; findings below may reference images not displayed]

FINDINGS: No fracture or dislocation is seen.

The joint spaces are preserved.

The visualized soft tissues are unremarkable.

Visualized right lung is clear.
IMPRESSION: Negative.

## 2023-10-21 IMAGING — CR DG WRIST COMPLETE 3+V*R*
4 series · 4 of 4 positions shown · non-contrast
Comparison: None Available.

CLINICAL DATA: Right arm pain/injury, snuffbox tenderness

EXAM:
RIGHT WRIST - COMPLETE 3+ VIEW

[x wrist pa right]
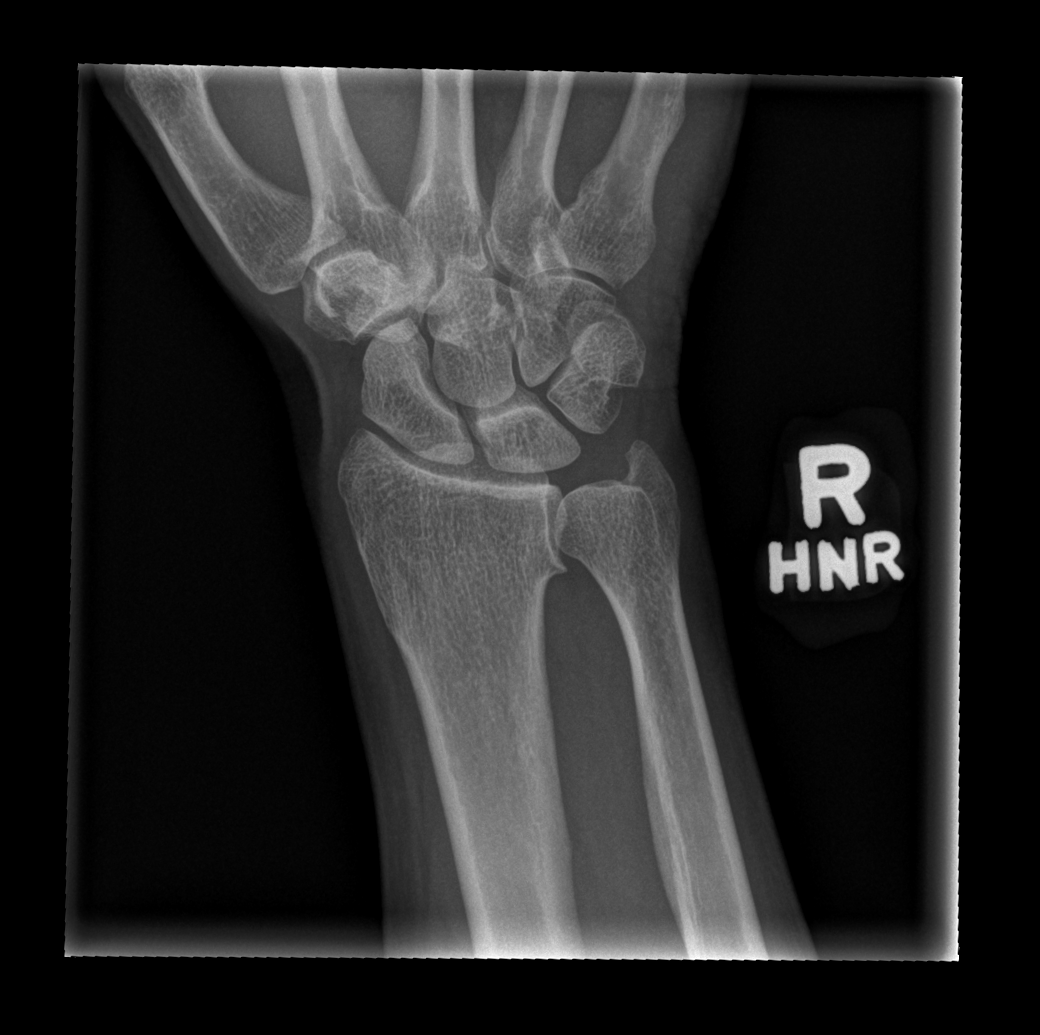

[x wrist obl right]
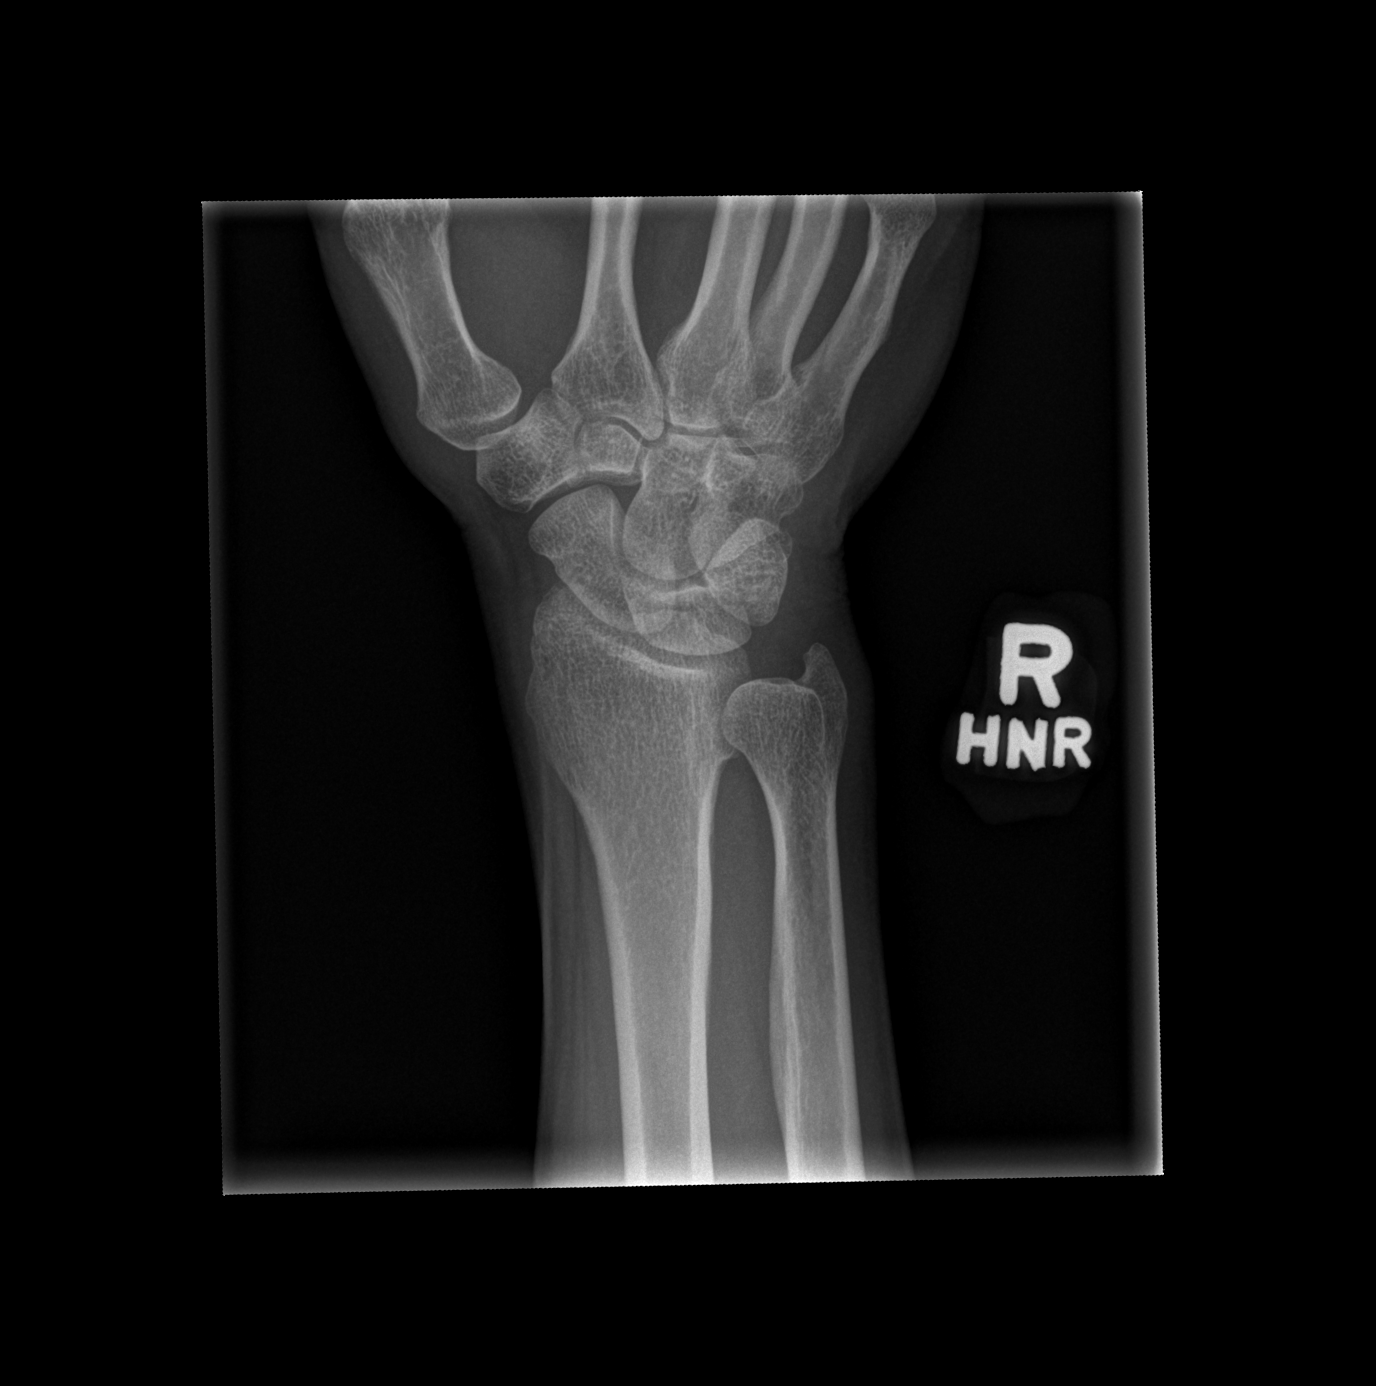

[x wrist lat right]
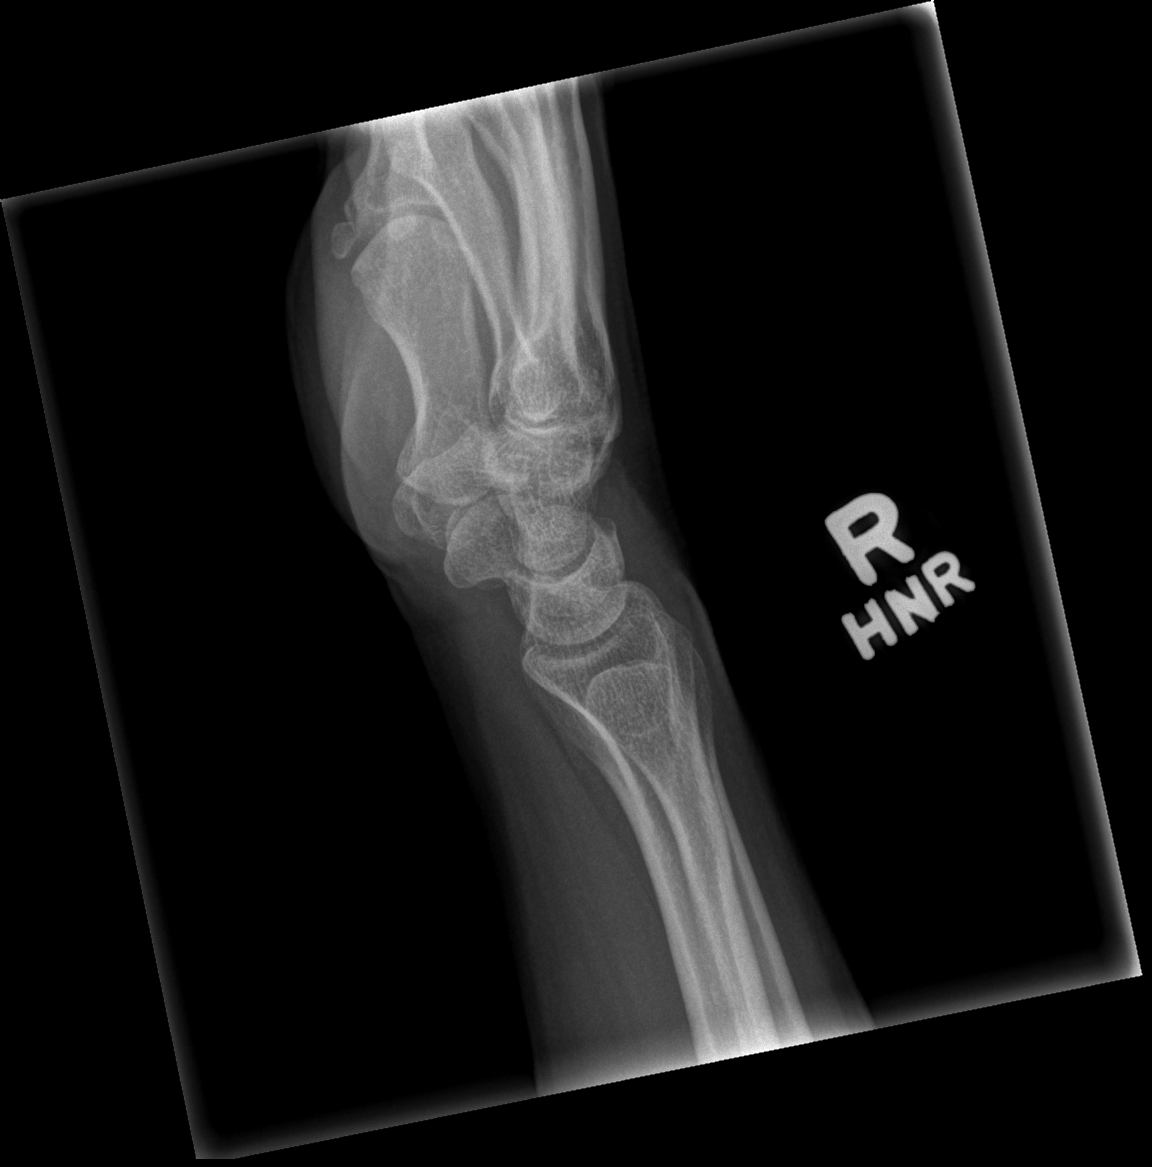

[x wrist navicular view right]
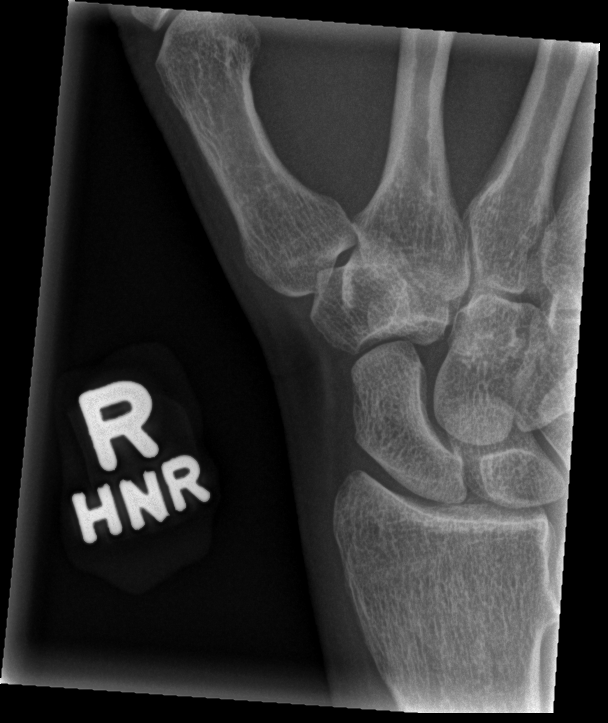

[4 of 4 positions shown; findings below may reference images not displayed]

FINDINGS: No fracture or dislocation is seen.

The joint spaces are preserved.

The visualized soft tissues are unremarkable.
IMPRESSION: Negative.

## 2023-11-02 ENCOUNTER — Encounter: Payer: Self-pay | Admitting: Emergency Medicine

## 2023-11-02 ENCOUNTER — Ambulatory Visit
Admission: EM | Admit: 2023-11-02 | Discharge: 2023-11-02 | Disposition: A | Discharge status: Home / Self Care | Attending: Family Medicine | Admitting: Family Medicine

## 2023-11-02 ENCOUNTER — Other Ambulatory Visit: Payer: Self-pay

## 2023-11-02 DIAGNOSIS — R519 Headache, unspecified: Secondary | ICD-10-CM | POA: Diagnosis not present

## 2023-11-02 MED ORDER — KETOROLAC TROMETHAMINE 60 MG/2ML IM SOLN
60.0000 mg | Freq: Once | INTRAMUSCULAR | Status: AC
  Administered 2023-11-02: 60 mg via INTRAMUSCULAR

## 2023-11-02 NOTE — ED Provider Notes (Signed)
 [facility]    CSN: [id number] Arrival date & time: 11/02/23  0931      History   Chief Complaint Chief Complaint  Patient presents with   Headache    HPI Edda Mikelson is a 48 y.o. female.   HPI 48 year old female presents with migraine for 1 week.  PMH significant for Ehlers-Danlos syndrome, chronic fatigue syndrome, and IBS.  Past Medical History:  Diagnosis Date   Achilles tendonitis    Allodynia    Anxiety    Arthritis    Asthma    Brachial plexopathy    CFS (chronic fatigue syndrome)    Chronic joint pain    Dizziness    Dysfunctional autonomic nervous system    Eczema    Ehlers-Danlos syndrome    GERD (gastroesophageal reflux disease)    Hernia, hiatal    Hypoglycemia    Hypotension    IBS (irritable bowel syndrome)    IBS (irritable bowel syndrome)    ME (myalgic encephalomyelitis)    Migraines    Nerve pain    Obesity    PMDD (premenstrual dysphoric disorder)    Recurrent upper respiratory infection (URI)    SVT (supraventricular tachycardia) (HCC)    Urticaria    Vestibular migraine     Patient Active Problem List   Diagnosis Date Noted   PTSD (post-traumatic stress disorder) 03/07/2020   Allergic rhinitis 12/21/2019   Asthma, mild intermittent 12/21/2019   History of epistaxis 12/21/2019   Status post bariatric surgery 11/06/2019   GERD (gastroesophageal reflux disease) 11/03/2019   IBS (irritable bowel syndrome) 11/03/2019   POTS (postural orthostatic tachycardia syndrome) 11/03/2019   Chronic joint pain 03/06/2019   Ehlers-Danlos syndrome 03/06/2019   Migraine without aura and without status migrainosus, not intractable 03/06/2019    Past Surgical History:  Procedure Laterality Date   ADENOIDECTOMY     CESAREAN SECTION  2008 and 82956   GASTRECTOMY  08/2016   HEMORROIDECTOMY     INCISION AND DRAINAGE ABSCESS ANAL  04/2002, 02/1999 and 9/20011   lumber laminectomy  02/2010   SINOSCOPY  1998 and 2019   TONSILLECTOMY      TREATMENT FISTULA ANAL      OB History     Gravida  3   Para  2   Term  2   Preterm      AB  1   Living  2      SAB      IAB      Ectopic      Multiple      Live Births  2            Home Medications    Prior to Admission medications   Medication Sig Start Date End Date Taking? Authorizing Provider  acetaminophen (TYLENOL) 500 MG tablet Take by mouth daily.     [provider]  acyclovir (ZOVIRAX) 400 MG tablet Take by mouth as needed.    [provider]  albuterol (VENTOLIN HFA) 108 (90 Base) MCG/ACT inhaler Inhale into the lungs. 04/16/19   [provider]  Atogepant (QULIPTA) 60 MG TABS Take by mouth.    [provider]  CALCIUM CITRATE-VITAMIN D3 PO Take by mouth daily.    [provider]  cetirizine (ZYRTEC) 10 MG tablet Take 10 mg by mouth as needed for allergies.    [provider]  CLONAZEPAM PO Take 5 mg by mouth 3 (three) times daily.    [provider]  cyclobenzaprine (FLEXERIL) 5 MG tablet Take 5 mg by mouth 3 (three) times daily as needed. 11/18/19   [provider]  desloratadine (CLARINEX) 5 MG tablet Take by mouth. 03/18/19   [provider]  [facility] 120 MG/ML SOAJ     [provider]  estradiol -levonorgestrel (CLIMARA  PRO) 0.045-0.015 MG/DAY Place 1 patch onto the skin once a week. 03/25/23   Lacey Pian, MD  ketorolac (TORADOL) 30 MG/ML injection Inject 60 mg by intramuscular route. 11/09/22   [provider]  lisdexamfetamine (VYVANSE) 50 MG capsule 1 cap(s) orally once a day (in the morning) for 30 days 03/08/20   [provider]  meclizine (ANTIVERT) 12.5 MG tablet Take by mouth as needed.    [provider]  montelukast (SINGULAIR) 10 MG tablet Take 1 tablet by mouth daily. 07/10/22   [provider]  Multiple Vitamin (MULTI-VITAMIN) tablet Take 1 tablet by mouth daily.    [provider]  NURTEC 75 MG TBDP  Take by mouth. 01/16/22   [provider]  pantoprazole (PROTONIX) 40 MG tablet Take 40 mg by mouth daily. 12/01/19   [provider]  promethazine (PHENERGAN) 25 MG tablet Take 25 mg by mouth 2 (two) times daily as needed. 11/16/19   [provider]  SENNOSIDES PO Take 500 mg by mouth daily.    [provider]  sertraline (ZOLOFT) 100 MG tablet Take 1 tablet by mouth daily. 07/15/19   [provider]  traMADol  (ULTRAM ) 50 MG tablet Take 1 tablet (50 mg total) by mouth every 6 (six) hours as needed. 06/16/23   Leonides Ramp, FNP  UNABLE TO FIND Med Name: immunotherapy    [provider]    Family History Family History  Problem Relation Age of Onset   Allergic rhinitis Mother    Asthma Mother    Eczema Mother    Urticaria Mother    Brain cancer Mother        Died from stroke after radiation   Allergic rhinitis Father    Asthma Father    Eczema Father    Urticaria Father    Melanoma Father    Allergic rhinitis Sister    Asthma Sister    Eczema Sister    Urticaria Sister    Melanoma Sister     Social History Social History   Tobacco Use   Smoking status: Former   Smokeless tobacco: Never  Advertising account planner   Vaping status: Never Used  Substance Use Topics   Alcohol use: Never   Drug use: Never     Allergies   Quinolones, Fluticasone, Other, Tuberculin ppd, Morphine, and Tape   Review of Systems Review of Systems   Physical Exam Triage Vital Signs ED Triage Vitals  Encounter Vitals Group     BP      Systolic BP Percentile      Diastolic BP Percentile      Pulse      Resp      Temp      Temp src      SpO2      Weight      Height      Head Circumference      Peak Flow      Pain Score      Pain Loc      Pain Education      Exclude from Growth Chart    No data found.  Updated Vital Signs BP (!) 140/93 (BP Location: Right  Arm)   Pulse 95   Temp 98 F (36.7 C) (Oral)   LMP 02/18/2023   SpO2 100%    Visual Acuity Right Eye Distance:   Left Eye Distance:   Bilateral Distance:    Right Eye Near:   Left Eye Near:    Bilateral Near:     Physical Exam Vitals and nursing note reviewed.  Constitutional:      Appearance: Normal appearance. She is normal weight.  HENT:     Head: Normocephalic and atraumatic.     Right Ear: Tympanic membrane, ear canal and external ear normal.     Left Ear: Tympanic membrane, ear canal and external ear normal.     Mouth/Throat:     Mouth: Mucous membranes are moist.     Pharynx: Oropharynx is clear.  Eyes:     Extraocular Movements: Extraocular movements intact.     Conjunctiva/sclera: Conjunctivae normal.     Pupils: Pupils are equal, round, and reactive to light.  Cardiovascular:     Rate and Rhythm: Normal rate and regular rhythm.     Pulses: Normal pulses.     Heart sounds: Normal heart sounds.  Pulmonary:     Effort: Pulmonary effort is normal.     Breath sounds: Normal breath sounds. No wheezing, rhonchi or rales.  Skin:    General: Skin is warm and dry.  Neurological:     General: No focal deficit present.     Mental Status: She is alert and oriented to person, place, and time.  Psychiatric:        Mood and Affect: Mood normal.        Behavior: Behavior normal.      UC Treatments / Results  Labs (all labs ordered are listed, but only abnormal results are displayed) Labs Reviewed - No data to display  EKG   Radiology No results found.  Procedures Procedures (including critical care time)  Medications Ordered in UC Medications  ketorolac (TORADOL) injection 60 mg (60 mg Intramuscular Given 11/02/23 1005)    Initial Impression / Assessment and Plan / UC Course  I have reviewed the triage vital signs and the nursing notes.  Pertinent labs & imaging results that were available during my care of the patient were reviewed by me and considered in my medical decision making (see chart for details).     MDM: 1.  Bad  headache-IM Toradol 60 mg given once in clinic per patient request. Advised patient if symptoms worsen and/or unresolved please follow-up with your PCP or here for further evaluation.  Patient discharged home, hemodynamically stable. Final Clinical Impressions(s) / UC Diagnoses   Final diagnoses:  Bad headache     Discharge Instructions      Advised patient if symptoms worsen and/or unresolved please follow-up with your PCP or here for further evaluation.   ED Prescriptions   None    PDMP not reviewed this encounter.   Leonides Ramp, FNP 11/02/23 1023

## 2023-11-02 NOTE — ED Triage Notes (Signed)
 Pt c/o migraine since last Sunday, states she has taken her rx migraine meds with no relief. States this has happened In the past and usually gets relief with toradol.

## 2023-11-02 NOTE — Discharge Instructions (Addendum)
 Advised patient if symptoms worsen and/or unresolved please follow-up with your PCP or here for further evaluation.

## 2023-12-07 ENCOUNTER — Ambulatory Visit
Admission: EM | Admit: 2023-12-07 | Discharge: 2023-12-07 | Disposition: A | Discharge status: Home / Self Care | Attending: Family Medicine | Admitting: Family Medicine

## 2023-12-07 ENCOUNTER — Encounter: Payer: Self-pay | Admitting: Emergency Medicine

## 2023-12-07 DIAGNOSIS — G43019 Migraine without aura, intractable, without status migrainosus: Secondary | ICD-10-CM | POA: Diagnosis not present

## 2023-12-07 MED ORDER — KETOROLAC TROMETHAMINE 30 MG/ML IJ SOLN
60.0000 mg | Freq: Once | INTRAMUSCULAR | Status: AC
  Administered 2023-12-07: 60 mg via INTRAMUSCULAR

## 2023-12-07 MED ORDER — BUTALBITAL-APAP-CAFFEINE 50-325-40 MG PO TABS: 1.0000 | ORAL_TABLET | Freq: Four times a day (QID) | ORAL | 0 refills | Status: DC | PRN

## 2023-12-07 NOTE — ED Triage Notes (Signed)
 Patient c/o migraine on and off x 1 week.  Patient has taken Tramadol  and Tylenol.  Nausea.  Requesting Toradol  60mg .

## 2023-12-07 NOTE — ED Provider Notes (Signed)
 [facility]    CSN: [id number] Arrival date & time: 12/07/23  1140      History   Chief Complaint Chief Complaint  Patient presents with   Migraine    HPI Renee Velasquez is a 48 y.o. female.   HPI  Patient with known migraine disorder.  Has been worked up by neurology.  Is here requesting a shot of Toradol .  States she has had a migraine for a week.  Not responding to her usual medication.  Nausea but no vomiting.  Photophobia.  No neurosymptoms   Past Medical History:  Diagnosis Date   Achilles tendonitis    Allodynia    Anxiety    Arthritis    Asthma    Brachial plexopathy    CFS (chronic fatigue syndrome)    Chronic joint pain    Dizziness    Dysfunctional autonomic nervous system    Eczema    Ehlers-Danlos syndrome    GERD (gastroesophageal reflux disease)    Hernia, hiatal    Hypoglycemia    Hypotension    IBS (irritable bowel syndrome)    IBS (irritable bowel syndrome)    ME (myalgic encephalomyelitis)    Migraines    Nerve pain    Obesity    PMDD (premenstrual dysphoric disorder)    Recurrent upper respiratory infection (URI)    SVT (supraventricular tachycardia) (HCC)    Urticaria    Vestibular migraine     Patient Active Problem List   Diagnosis Date Noted   PTSD (post-traumatic stress disorder) 03/07/2020   Allergic rhinitis 12/21/2019   Asthma, mild intermittent 12/21/2019   History of epistaxis 12/21/2019   Status post bariatric surgery 11/06/2019   GERD (gastroesophageal reflux disease) 11/03/2019   IBS (irritable bowel syndrome) 11/03/2019   POTS (postural orthostatic tachycardia syndrome) 11/03/2019   Chronic joint pain 03/06/2019   Ehlers-Danlos syndrome 03/06/2019   Migraine without aura and without status migrainosus, not intractable 03/06/2019    Past Surgical History:  Procedure Laterality Date   ADENOIDECTOMY     CESAREAN SECTION  2008 and 79989   GASTRECTOMY  08/2016   HEMORROIDECTOMY     INCISION AND  DRAINAGE ABSCESS ANAL  04/2002, 02/1999 and 9/20011   lumber laminectomy  02/2010   SINOSCOPY  1998 and 2019   TONSILLECTOMY     TREATMENT FISTULA ANAL      OB History     Gravida  3   Para  2   Term  2   Preterm      AB  1   Living  2      SAB      IAB      Ectopic      Multiple      Live Births  2            Home Medications    Prior to Admission medications   Medication Sig Start Date End Date Taking? Authorizing Provider  acetaminophen (TYLENOL) 500 MG tablet Take by mouth daily.    Yes [provider]  acyclovir (ZOVIRAX) 400 MG tablet Take by mouth as needed.   Yes [provider]  albuterol (VENTOLIN HFA) 108 (90 Base) MCG/ACT inhaler Inhale into the lungs. 04/16/19  Yes [provider]  Atogepant (QULIPTA) 60 MG TABS Take by mouth.   Yes [provider]  butalbital -acetaminophen-caffeine  (FIORICET) 50-325-40 MG tablet Take 1 tablet by mouth every 6 (six) hours as needed for headache. 12/07/23 12/06/24 Yes Maranda Rockers  Beverley, MD  CALCIUM CITRATE-VITAMIN D3 PO Take by mouth daily.   Yes [provider]  cetirizine (ZYRTEC) 10 MG tablet Take 10 mg by mouth as needed for allergies.   Yes [provider]  CLONAZEPAM PO Take 5 mg by mouth 3 (three) times daily.   Yes [provider]  cyclobenzaprine (FLEXERIL) 5 MG tablet Take 5 mg by mouth 3 (three) times daily as needed. 11/18/19  Yes [provider]  desloratadine (CLARINEX) 5 MG tablet Take by mouth. 03/18/19  Yes [provider]  EMGALITY 120 MG/ML SOAJ    Yes [provider]  estradiol -levonorgestrel (CLIMARA  PRO) 0.045-0.015 MG/DAY Place 1 patch onto the skin once a week. 03/25/23  Yes Cleatus Moccasin, MD  lisdexamfetamine (VYVANSE) 50 MG capsule 1 cap(s) orally once a day (in the morning) for 30 days 03/08/20  Yes [provider]  meclizine (ANTIVERT) 12.5 MG tablet Take by mouth as needed.   Yes [provider]  montelukast (SINGULAIR) 10 MG tablet Take 1 tablet by mouth daily. 07/10/22  Yes [provider]  Multiple Vitamin (MULTI-VITAMIN) tablet Take 1 tablet by mouth daily.   Yes [provider]  NURTEC 75 MG TBDP Take by mouth. 01/16/22  Yes [provider]  pantoprazole (PROTONIX) 40 MG tablet Take 40 mg by mouth daily. 12/01/19  Yes [provider]  promethazine (PHENERGAN) 25 MG tablet Take 25 mg by mouth 2 (two) times daily as needed. 11/16/19  Yes [provider]  SENNOSIDES PO Take 500 mg by mouth daily.   Yes [provider]  sertraline (ZOLOFT) 100 MG tablet Take 1 tablet by mouth daily. 07/15/19  Yes [provider]  traMADol  (ULTRAM ) 50 MG tablet Take 1 tablet (50 mg total) by mouth every 6 (six) hours as needed. 06/16/23  Yes Teddy Sharper, FNP  UNABLE TO FIND Med Name: immunotherapy   Yes [provider]    Family History Family History  Problem Relation Age of Onset   Allergic rhinitis Mother    Asthma Mother    Eczema Mother    Urticaria Mother    Brain cancer Mother        Died from stroke after radiation   Allergic rhinitis Father    Asthma Father    Eczema Father    Urticaria Father    Melanoma Father    Allergic rhinitis Sister    Asthma Sister    Eczema Sister    Urticaria Sister    Melanoma Sister     Social History Social History   Tobacco Use   Smoking status: Former   Smokeless tobacco: Never  Advertising account planner   Vaping status: Never Used  Substance Use Topics   Alcohol use: Never   Drug use: Never     Allergies   Quinolones, Fluticasone, Other, Tuberculin ppd, Morphine, and Tape   Review of Systems Review of Systems See HPI  Physical Exam Triage Vital Signs ED Triage Vitals  Encounter Vitals Group     BP 12/07/23 1232 132/88     Girls Systolic BP Percentile --      Girls Diastolic BP Percentile --      Boys Systolic BP Percentile --      Boys Diastolic BP  Percentile --      Pulse Rate 12/07/23 1232 77     Resp 12/07/23 1232 18     Temp 12/07/23 1232 98.7 F (37.1 C)     Temp Source 12/07/23  1232 Oral     SpO2 12/07/23 1232 100 %     Weight 12/07/23 1234 140 lb (63.5 kg)     Height 12/07/23 1234 5' 7 (1.702 m)     Head Circumference --      Peak Flow --      Pain Score 12/07/23 1233 7     Pain Loc --      Pain Education --      Exclude from Growth Chart --    No data found.  Updated Vital Signs BP 132/88 (BP Location: Right Arm)   Pulse 77   Temp 98.7 F (37.1 C) (Oral)   Resp 18   Ht 5' 7 (1.702 m)   Wt 63.5 kg   LMP 02/18/2023   SpO2 100%   BMI 21.93 kg/m      Physical Exam Constitutional:      General: She is not in acute distress.    Appearance: She is well-developed and normal weight. She is ill-appearing.  HENT:     Head: Normocephalic and atraumatic.  Eyes:     Extraocular Movements: Extraocular movements intact.     Conjunctiva/sclera: Conjunctivae normal.     Pupils: Pupils are equal, round, and reactive to light.  Cardiovascular:     Rate and Rhythm: Normal rate.  Pulmonary:     Effort: Pulmonary effort is normal. No respiratory distress.  Musculoskeletal:        General: Normal range of motion.     Cervical back: Normal range of motion.  Skin:    General: Skin is warm and dry.  Neurological:     General: No focal deficit present.     Mental Status: She is alert.      UC Treatments / Results  Labs (all labs ordered are listed, but only abnormal results are displayed) Labs Reviewed - No data to display  EKG   Radiology No results found.  Procedures Procedures (including critical care time)  Medications Ordered in UC Medications  ketorolac  (TORADOL ) 30 MG/ML injection 60 mg (60 mg Intramuscular Given 12/07/23 1243)    Initial Impression / Assessment and Plan / UC Course  I have reviewed the triage vital signs and the nursing notes.  Pertinent labs & imaging results that were  available during my care of the patient were reviewed by me and considered in my medical decision making (see chart for details).     Refilled patient's Fioricet as a courtesy. Final Clinical Impressions(s) / UC Diagnoses   Final diagnoses:  Intractable migraine without aura and without status migrainosus     Discharge Instructions      I have refilled your Fioricet headache medicine Follow-up with your PCP     ED Prescriptions     Medication Sig Dispense Auth. Provider   butalbital -acetaminophen-caffeine  (FIORICET) 50-325-40 MG tablet Take 1 tablet by mouth every 6 (six) hours as needed for headache. 20 tablet Maranda Jamee Jacob, MD      I have reviewed the PDMP during this encounter.   Maranda Jamee Jacob, MD 12/07/23 [phone]

## 2023-12-07 NOTE — Discharge Instructions (Signed)
 I have refilled your Fioricet headache medicine Follow-up with your PCP

## 2024-02-06 ENCOUNTER — Telehealth: Payer: Self-pay

## 2024-02-06 NOTE — Telephone Encounter (Signed)
 RN attempted to return patient call regarding need for PA for Climaro Pro and in need of 90 day supply sent to [facility]. RN left HIPAA compliant voicemail to return RN call.  Silvano LELON Piano, RN

## 2024-02-06 NOTE — Progress Notes (Signed)
 Received need for PA for Climara  Pro, sent to [facility], pending.  Silvano LELON Piano, RN

## 2024-02-12 ENCOUNTER — Telehealth: Payer: Self-pay | Admitting: *Deleted

## 2024-02-12 NOTE — Telephone Encounter (Signed)
 Returned call from 9:46 AM. Patient advised to contact pharmacy for refill request, correct fax number provided. Patient also given appointment information.

## 2024-02-13 ENCOUNTER — Other Ambulatory Visit: Payer: Self-pay

## 2024-02-13 DIAGNOSIS — N951 Menopausal and female climacteric states: Secondary | ICD-10-CM

## 2024-02-13 MED ORDER — CLIMARA PRO 0.045-0.015 MG/DAY TD PTWK: 1.0000 | MEDICATED_PATCH | TRANSDERMAL | 0 refills | Status: DC

## 2024-02-13 NOTE — Telephone Encounter (Signed)
 RN had contacted provider regarding PA. Pt requested refill as has been out, PA sent, rejected. Pt requested to try to send through [facility]/90 day supply only since mail order. Pt has scheduled appointment with Dr. Cleatus.  Silvano LELON Piano, RN

## 2024-02-17 ENCOUNTER — Telehealth: Payer: Self-pay

## 2024-02-17 ENCOUNTER — Other Ambulatory Visit: Payer: Self-pay | Admitting: Obstetrics and Gynecology

## 2024-02-17 DIAGNOSIS — N951 Menopausal and female climacteric states: Secondary | ICD-10-CM

## 2024-02-17 MED ORDER — COMBIPATCH 0.05-0.25 MG/DAY TD PTTW: 1.0000 | MEDICATED_PATCH | TRANSDERMAL | 3 refills | Status: DC

## 2024-02-17 NOTE — Progress Notes (Signed)
 Climara  Pro not covered. Combipatch  sent in.

## 2024-02-17 NOTE — Telephone Encounter (Signed)
 RN attempted to call patient to notify of rejection from [facility] for Climaro Pro and that provider sent in Combipatch . RN left HIPAA compliant voicemail to return RN call.   Renee Velasquez W [name]

## 2024-02-18 ENCOUNTER — Telehealth: Payer: Self-pay

## 2024-02-18 NOTE — Telephone Encounter (Signed)
 RN attempted to return patient call left on nurse line. RN left HIPAA compliant voicemail to return RN call.  Silvano LELON Piano, RN

## 2024-02-26 NOTE — Progress Notes (Unsigned)
   ANNUAL EXAM Patient name: Renee Velasquez MRN [id number]  Date of birth: 07-10-1975 Chief Complaint:   No chief complaint on file.  History of Present Illness:   Renee Velasquez is a 48 y.o. [id number] female being seen today for a routine annual exam.   Current concerns: ***   Patient's last menstrual period was 02/18/2023.   Last MXR: 07/15/23, birad 1, cat c density  Last Pap/Pap History:  H/O abnormal pap: yes 11/2019, normal pap, no hpv done 02/2022: ascus/hpv 16pos >neg colpo w/bx 02/2023: nl/HPV pos, non 16/18>CIN1 on ECC  Review of Systems:   Pertinent items are noted in HPI Denies any headaches, blurred vision, fatigue, shortness of breath, chest pain, abdominal pain, abnormal vaginal discharge/itching/odor/irritation, problems with periods, bowel movements, urination, or intercourse unless otherwise stated above. *** Pertinent History Reviewed:  Reviewed past medical,surgical, social and family history.  Reviewed problem list, medications and allergies. Physical Assessment:  There were no vitals filed for this visit.There is no height or weight on file to calculate BMI.   Physical Examination:  General appearance - well appearing, and in no distress Mental status - alert, oriented to person, place, and time Psych:  She has a normal mood and affect Skin - warm and dry, normal color, no suspicious lesions noted Chest - effort normal Heart - normal rate  Breasts - breasts appear normal, no suspicious masses, no skin or nipple changes or axillary nodes Abdomen - soft, nontender, nondistended, no masses or organomegaly Pelvic -  {Blank single:19197::Performed and:,declines,not indicated} VULVA: {Blank single:19197::Not examined,normal appearing vulva with no masses, tenderness or lesions,***} VAGINA: {Blank single:19197::Not examined,normal appearing vagina with normal color and discharge, no lesions,***} CERVIX: {Blank single:19197::Not examined,normal  appearing cervix without discharge or lesions, no CMT.,***} UTERUS: {Blank single:19197::Not examined,uterus is felt to be normal size, shape, consistency and nontender,***} ADNEXA: {Blank single:19197::Not examined,No adnexal masses or tenderness noted,***} Extremities:  No swelling or varicosities noted  Chaperone present for exam  No results found for this or any previous visit (from the past 24 hours).  Assessment & Plan:  Diagnoses and all orders for this visit:  Encounter for annual routine gynecological examination  Hot flashes due to menopause  Abnormal cervical Papanicolaou smear, unspecified abnormal pap finding   - Cervical cancer screening: Discussed guidelines. Pap with HPV done - STD Testing: {Blank single:19197::accepts,declines,not indicated} - Climacteric: See below.  - Breast Health: Encouraged self breast awareness/SBE. Teaching provided. Discussed limits of clinical breast exam for detecting breast cancer. Rx given for MXR - F/U 12 months and prn    No orders of the defined types were placed in this encounter.   Meds: No orders of the defined types were placed in this encounter.   Follow-up: No follow-ups on file.  Vina Solian, MD 02/26/2024 1:43 PM

## 2024-02-27 ENCOUNTER — Other Ambulatory Visit (HOSPITAL_COMMUNITY)
Admission: RE | Admit: 2024-02-27 | Discharge: 2024-02-27 | Disposition: A | Discharge status: Home / Self Care | Source: Ambulatory Visit | Attending: Obstetrics and Gynecology | Admitting: Obstetrics and Gynecology

## 2024-02-27 ENCOUNTER — Ambulatory Visit (INDEPENDENT_AMBULATORY_CARE_PROVIDER_SITE_OTHER): Admitting: Obstetrics and Gynecology

## 2024-02-27 VITALS — BP 114/75 | HR 70 | Ht 68.0 in | Wt 142.1 lb

## 2024-02-27 DIAGNOSIS — N950 Postmenopausal bleeding: Secondary | ICD-10-CM

## 2024-02-27 DIAGNOSIS — Z01419 Encounter for gynecological examination (general) (routine) without abnormal findings: Secondary | ICD-10-CM | POA: Diagnosis present

## 2024-02-27 DIAGNOSIS — Z202 Contact with and (suspected) exposure to infections with a predominantly sexual mode of transmission: Secondary | ICD-10-CM | POA: Diagnosis not present

## 2024-02-27 DIAGNOSIS — N951 Menopausal and female climacteric states: Secondary | ICD-10-CM | POA: Diagnosis not present

## 2024-02-27 DIAGNOSIS — R87619 Unspecified abnormal cytological findings in specimens from cervix uteri: Secondary | ICD-10-CM | POA: Diagnosis not present

## 2024-03-02 ENCOUNTER — Ambulatory Visit: Payer: Self-pay | Admitting: Obstetrics and Gynecology

## 2024-03-02 LAB — CYTOLOGY - PAP
Chlamydia: NEGATIVE
Comment: NEGATIVE
Comment: NEGATIVE
Comment: NEGATIVE
Comment: NORMAL
Diagnosis: NEGATIVE
High risk HPV: NEGATIVE
Neisseria Gonorrhea: NEGATIVE
Trichomonas: NEGATIVE

## 2024-03-27 ENCOUNTER — Other Ambulatory Visit: Payer: Self-pay | Admitting: Obstetrics and Gynecology

## 2024-04-09 ENCOUNTER — Telehealth: Payer: Self-pay

## 2024-04-09 ENCOUNTER — Encounter: Payer: Self-pay | Admitting: Obstetrics and Gynecology

## 2024-04-09 DIAGNOSIS — N950 Postmenopausal bleeding: Secondary | ICD-10-CM

## 2024-04-09 DIAGNOSIS — N840 Polyp of corpus uteri: Secondary | ICD-10-CM

## 2024-04-09 NOTE — Telephone Encounter (Signed)
 I called the patient to schedule surgery w/ [facility] on 04/20/24. Patient states she would like to wait and proceed with a hysterectomy. Patient will consult w/ Dr. Cleatus.

## 2024-04-15 ENCOUNTER — Other Ambulatory Visit: Payer: Self-pay

## 2024-04-15 NOTE — Telephone Encounter (Signed)
 Received electronic refill request for Climara  Pro Patch, approved by Dr. Cleatus for year's worth, faxed to pharmacy.   Silvano LELON Piano, RN

## 2024-04-19 ENCOUNTER — Ambulatory Visit
Admission: EM | Admit: 2024-04-19 | Discharge: 2024-04-19 | Disposition: A | Discharge status: Home / Self Care | Attending: Family Medicine | Admitting: Family Medicine

## 2024-04-19 ENCOUNTER — Other Ambulatory Visit: Payer: Self-pay

## 2024-04-19 DIAGNOSIS — R21 Rash and other nonspecific skin eruption: Secondary | ICD-10-CM

## 2024-04-19 MED ORDER — METHYLPREDNISOLONE SODIUM SUCC 125 MG IJ SOLR
125.0000 mg | Freq: Once | INTRAMUSCULAR | Status: AC
  Administered 2024-04-19: 125 mg via INTRAMUSCULAR

## 2024-04-19 NOTE — ED Triage Notes (Signed)
 Reports was travelling and was in a hotel, felt really itchy and started scratching arm. Noticed bed bugs in bed. This was Friday night. Has ehlers danlos per pt. Has been moving luggage and has c/o exhaustion. Reports body feels itchy and feels inflamed. Has c/o migraine. Reports she moved the bed and has chest muscle pain.

## 2024-04-19 NOTE — Discharge Instructions (Addendum)
 Advised patient if symptoms worsen and are unresolved please follow-up with your PCP or here for further evaluation.

## 2024-04-19 NOTE — ED Provider Notes (Signed)
 [facility]    CSN: [id number] Arrival date & time: 04/19/24  1547      History   Chief Complaint Chief Complaint  Patient presents with   Insect Bite   Muscle Pain    HPI Renee Velasquez is a 48 y.o. female.   HPI  Past Medical History:  Diagnosis Date   Achilles tendonitis    Allodynia    Anxiety    Arthritis    Asthma    Brachial plexopathy    CFS (chronic fatigue syndrome)    Chronic joint pain    Dizziness    Dysfunctional autonomic nervous system    Eczema    Ehlers-Danlos syndrome    GERD (gastroesophageal reflux disease)    Hernia, hiatal    Hypoglycemia    Hypotension    IBS (irritable bowel syndrome)    IBS (irritable bowel syndrome)    ME (myalgic encephalomyelitis)    Migraines    Nerve pain    Obesity    PMDD (premenstrual dysphoric disorder)    Recurrent upper respiratory infection (URI)    SVT (supraventricular tachycardia)    Urticaria    Vestibular migraine     Patient Active Problem List   Diagnosis Date Noted   PTSD (post-traumatic stress disorder) 03/07/2020   Allergic rhinitis 12/21/2019   Asthma, mild intermittent 12/21/2019   History of epistaxis 12/21/2019   Status post bariatric surgery 11/06/2019   GERD (gastroesophageal reflux disease) 11/03/2019   IBS (irritable bowel syndrome) 11/03/2019   POTS (postural orthostatic tachycardia syndrome) 11/03/2019   Chronic joint pain 03/06/2019   Ehlers-Danlos syndrome 03/06/2019   Migraine without aura and without status migrainosus, not intractable 03/06/2019    Past Surgical History:  Procedure Laterality Date   ADENOIDECTOMY     CESAREAN SECTION  2008 and 79989   GASTRECTOMY  08/2016   HEMORROIDECTOMY     INCISION AND DRAINAGE ABSCESS ANAL  04/2002, 02/1999 and 9/20011   lumber laminectomy  02/2010   SINOSCOPY  1998 and 2019   TONSILLECTOMY     TREATMENT FISTULA ANAL      OB History     Gravida  3   Para  2   Term  2   Preterm      AB  1   Living   2      SAB      IAB      Ectopic      Multiple      Live Births  2            Home Medications    Prior to Admission medications   Medication Sig Start Date End Date Taking? Authorizing Provider  acetaminophen (TYLENOL) 500 MG tablet Take by mouth daily.     [provider]  acyclovir (ZOVIRAX) 400 MG tablet Take by mouth as needed.    [provider]  albuterol (VENTOLIN HFA) 108 (90 Base) MCG/ACT inhaler Inhale into the lungs. 04/16/19   [provider]  cetirizine (ZYRTEC) 10 MG tablet Take 10 mg by mouth as needed for allergies.    [provider]  CLONAZEPAM PO Take 5 mg by mouth 3 (three) times daily.    [provider]  desloratadine (CLARINEX) 5 MG tablet Take by mouth. 03/18/19   [provider]  [facility] 120 MG/ML SOAJ     [provider]  Estradiol -Levonorgestrel (CLIMARA  PRO TD) Place 1 patch onto the skin once a week.    [provider]  estradiol -norethindrone  (COMBIPATCH ) 0.05-0.25 MG/DAY Place 1 patch onto the skin 2 (two) times a week. 02/17/24   Cleatus Moccasin, MD  lisdexamfetamine (VYVANSE) 50 MG capsule 1 cap(s) orally once a day (in the morning) for 30 days 03/08/20   [provider]  montelukast (SINGULAIR) 10 MG tablet Take 1 tablet by mouth daily. 07/10/22   [provider]  Multiple Vitamin (MULTI-VITAMIN) tablet Take 1 tablet by mouth daily.    [provider]  pantoprazole (PROTONIX) 40 MG tablet Take 40 mg by mouth daily. 12/01/19   [provider]  promethazine (PHENERGAN) 25 MG tablet Take 25 mg by mouth 2 (two) times daily as needed. 11/16/19   [provider]  SENNOSIDES PO Take 500 mg by mouth daily.    [provider]  sertraline (ZOLOFT) 100 MG tablet Take 1 tablet by mouth daily. 07/15/19   [provider]  traMADol  (ULTRAM ) 50 MG tablet Take 1 tablet (50 mg total) by mouth every 6 (six) hours as needed. 06/16/23    Teddy Sharper, FNP  UNABLE TO FIND Med Name: immunotherapy    [provider]    Family History Family History  Problem Relation Age of Onset   Allergic rhinitis Mother    Asthma Mother    Eczema Mother    Urticaria Mother    Brain cancer Mother        Died from stroke after radiation   Allergic rhinitis Father    Asthma Father    Eczema Father    Urticaria Father    Melanoma Father    Allergic rhinitis Sister    Asthma Sister    Eczema Sister    Urticaria Sister    Melanoma Sister     Social History Social History   Tobacco Use   Smoking status: Former   Smokeless tobacco: Never  Advertising Account Planner   Vaping status: Never Used  Substance Use Topics   Alcohol use: Never   Drug use: Never     Allergies   Quinolones, Fluticasone, Other, Tuberculin ppd, Morphine, and Tape   Review of Systems Review of Systems   Physical Exam Triage Vital Signs ED Triage Vitals [04/19/24 1603]  Encounter Vitals Group     BP (!) 148/102     Girls Systolic BP Percentile      Girls Diastolic BP Percentile      Boys Systolic BP Percentile      Boys Diastolic BP Percentile      Pulse Rate (!) 108     Resp 16     Temp 98 F (36.7 C)     Temp src      SpO2 97 %     Weight      Height      Head Circumference      Peak Flow      Pain Score      Pain Loc      Pain Education      Exclude from Growth Chart    No data found.  Updated Vital Signs BP (!) 148/102   Pulse (!) 108   Temp 98 F (36.7 C)   Resp 16   LMP 02/18/2023   SpO2 97%   Physical Exam Vitals and nursing note reviewed.  Constitutional:      General: She is not in acute distress.    Appearance: Normal appearance. She is normal weight. She is not ill-appearing.  HENT:     Head: Normocephalic  and atraumatic.     Mouth/Throat:     Mouth: Mucous membranes are moist.     Pharynx: Oropharynx is clear.  Eyes:     Extraocular Movements: Extraocular movements intact.     Conjunctiva/sclera:  Conjunctivae normal.     Pupils: Pupils are equal, round, and reactive to light.  Cardiovascular:     Rate and Rhythm: Normal rate and regular rhythm.     Pulses: Normal pulses.     Heart sounds: Normal heart sounds.  Pulmonary:     Effort: Pulmonary effort is normal.     Breath sounds: Normal breath sounds. No wheezing, rhonchi or rales.  Musculoskeletal:        General: Normal range of motion.  Skin:    General: Skin is warm and dry.     Comments: Left lower arm (superior lateral aspect) and right hip region: Pruritic erythematous maculopapular eruption please see images below  Neurological:     General: No focal deficit present.     Mental Status: She is alert and oriented to person, place, and time. Mental status is at baseline.  Psychiatric:        Mood and Affect: Mood normal.        Behavior: Behavior normal.         UC Treatments / Results  Labs (all labs ordered are listed, but only abnormal results are displayed) Labs Reviewed - No data to display  EKG   Radiology No results found.  Procedures Procedures (including critical care time)  Medications Ordered in UC Medications  methylPREDNISolone sodium succinate (SOLU-MEDROL) 125 mg/2 mL injection 125 mg (125 mg Intramuscular Given 04/19/24 1634)    Initial Impression / Assessment and Plan / UC Course  I have reviewed the triage vital signs and the nursing notes.  Pertinent labs & imaging results that were available during my care of the patient were reviewed by me and considered in my medical decision making (see chart for details).     MDM: 1.  Rash and nonspecific skin eruption-IM Solu-Medrol 125 mg given once in clinic and prior to discharge. Advised patient if symptoms worsen and are unresolved please follow-up with your PCP or here for further evaluation.  Final Clinical Impressions(s) / UC Diagnoses   Final diagnoses:  Rash and nonspecific skin eruption     Discharge Instructions       Advised patient if symptoms worsen and are unresolved please follow-up with your PCP or here for further evaluation.     ED Prescriptions   None    PDMP not reviewed this encounter.   Teddy Sharper, FNP 04/19/24 [phone]

## 2024-05-06 NOTE — Progress Notes (Unsigned)
 GYNECOLOGY OFFICE VISIT NOTE  History:  Renee Velasquez is a 48 y.o. [id number] here today for preop visit and EMB d/t persistent PMB on HRT desiring hysterectomy.   She also has a known polyp vs fibroid by US .   She would also prefer hysterectomy due to her intolerance to the combipatch  which is covered by insurance. She did better on the Climara  Pro but insurance won't cover. She feels if she has a hysterectomy, she then has more options for patches as well to help with her HT.  Until her hysterectomy, she is requesting E2 only patch (she cannot tolerate combipatch , and insurance won't cover climara  pro) just until hysterectomy.   She has had two prior c-sections.   She had an US  in 2024 which showed multiple fibroids including the submucosal fibroid.   Of note, recent (11/14) experience where she was exposed to bed bugs in hotel and having significant impact on her health since that time (see scanned in chart).     Past Medical History:  Diagnosis Date   Achilles tendonitis    Allodynia    Anxiety    Arthritis    Asthma    Brachial plexopathy    CFS (chronic fatigue syndrome)    Chronic joint pain    Dizziness    Dysfunctional autonomic nervous system    Eczema    Ehlers-Danlos syndrome    GERD (gastroesophageal reflux disease)    Hernia, hiatal    Hypoglycemia    Hypotension    IBS (irritable bowel syndrome)    IBS (irritable bowel syndrome)    ME (myalgic encephalomyelitis)    Migraines    Nerve pain    Obesity    PMDD (premenstrual dysphoric disorder)    Recurrent upper respiratory infection (URI)    SVT (supraventricular tachycardia)    Urticaria    Vestibular migraine     Past Surgical History:  Procedure Laterality Date   ADENOIDECTOMY     CESAREAN SECTION  2008 and 79989   GASTRECTOMY  08/2016   HEMORROIDECTOMY     INCISION AND DRAINAGE ABSCESS ANAL  04/2002, 02/1999 and 9/20011   lumber laminectomy  02/2010   SINOSCOPY  1998 and 2019   TONSILLECTOMY      TREATMENT FISTULA ANAL      The following portions of the patient's history were reviewed and updated as appropriate: allergies, current medications, past family history, past medical history, past social history, past surgical history and problem list.   Health Maintenance:   Normal pap and negative HRHPV:   Diagnosis  Date Value Ref Range Status  02/27/2024   Final   - Negative for intraepithelial lesion or malignancy (NILM)     Normal mammogram on 07/15/23.   Review of Systems:  Pertinent items noted in HPI and remainder of comprehensive ROS otherwise negative.  Physical Exam:  LMP 02/18/2023  CONSTITUTIONAL: Well-developed, well-nourished female in no acute distress.  HEENT:  Normocephalic, atraumatic. External right and left ear normal. No scleral icterus.  NECK: Normal range of motion, supple, no masses noted on observation SKIN: No rash noted. Not diaphoretic. No erythema. No pallor. MUSCULOSKELETAL: Normal range of motion. No edema noted. NEUROLOGIC: Alert and oriented to person, place, and time. Normal muscle tone coordination. No cranial nerve deficit noted. PSYCHIATRIC: Normal mood and affect. Normal behavior. Normal judgment and thought content.  PELVIC: Normal appearing external genitalia; normal urethral meatus; normal appearing vaginal mucosa and cervix.  No abnormal discharge noted. Performed in the presence  of a chaperone       GYNECOLOGY OFFICE PROCEDURE NOTE   ENDOMETRIAL BIOPSY     The indications for endometrial biopsy were reviewed.   Risks of the biopsy including cramping, bleeding, infection, uterine perforation, inadequate specimen and need for additional procedures were discussed. Offered alternative of hysteroscopy, dilation and curettage in OR. The patient states she understands the R/B/I/A and agrees to undergo procedure today.   Urine pregnancy test was Not indicated.   Consent was signed. Time out was performed.    Patient was positioned in  dorsal lithotomy position. A vaginal speculum was placed.  The cervix was visualized and was prepped with Betadine.  A single-toothed tenaculum was placed on the anterior lip of the cervix to stabilize it. The 3 mm pipelle was easily introduced into the endometrial cavity without difficulty to a depth of 7 cm, and a Scant amount of tissue was obtained after 2 passes and sent to pathology. The instruments were removed from the patient's vagina. Minimal bleeding from the cervix was noted. The patient tolerated the procedure well.  Assessment and Plan:  1. Preoperative examination (Primary) - Diagnosis: persistent postmenopausal bleeding - Planned surgery: TLH, BSO, cystoscopy, TAP block  - Risks of surgery include but are not limited to:  Bleeding - Can bleed enough to need transfusion or need for additional surgeries I.e. conversation to open surgery.  Infection - The vagina can develop cuff infection Injury to surrounding organs/tissues (i.e. bowel/bladder/ureters) - discussed how each of these is managed I.e. bladder injury requires catheter for 10-14 days after surgery Need for additional procedures - Would be specific to a complication or injury Wound complications - No specific wound unless converted to open surgery or laparoscopic Hospital re-admission - In the event of a delayed complication being recognized I.e. ureteral injury discussed Conversion to open surgery - reviewed some complications may necessitate or it may be the only way to complete the surgery.   VTE - Discussed risk of blood clots following delivery - We discussed alternatives to hysterectomy including birth control options that will work to regulate bleeding, I.e. IUD, endometrial ablation. She desires definitive management.  - We discussed postop restrictions, precautions and expectations - Preop testing needed:  No - Need for medical clearance? No - Will send message to anesthesia for preop consult. Message sent.  - All  questions answered   2. PMB (postmenopausal bleeding) EMB done today   No orders of the defined types were placed in this encounter.    Routine preventative health maintenance measures emphasized. Please refer to After Visit Summary for other counseling recommendations.   No follow-ups on file.  Vina Solian, MD, FACOG Obstetrician & Gynecologist, [facility] for [facility], [facility]

## 2024-05-06 NOTE — H&P (View-Only) (Signed)
 GYNECOLOGY OFFICE VISIT NOTE  History:  Renee Velasquez is a 48 y.o. [id number] here today for preop visit and EMB d/t persistent PMB on HRT desiring hysterectomy.   She also has a known polyp vs fibroid by US .   She would also prefer hysterectomy due to her intolerance to the combipatch  which is covered by insurance. She did better on the Climara  Pro but insurance won't cover. She feels if she has a hysterectomy, she then has more options for patches as well to help with her HT.  Until her hysterectomy, she is requesting E2 only patch (she cannot tolerate combipatch , and insurance won't cover climara  pro) just until hysterectomy.   She has had two prior c-sections.   She had an US  in 2024 which showed multiple fibroids including the submucosal fibroid.   Of note, recent (11/14) experience where she was exposed to bed bugs in hotel and having significant impact on her health since that time (see scanned in chart).     Past Medical History:  Diagnosis Date   Achilles tendonitis    Allodynia    Anxiety    Arthritis    Asthma    Brachial plexopathy    CFS (chronic fatigue syndrome)    Chronic joint pain    Dizziness    Dysfunctional autonomic nervous system    Eczema    Ehlers-Danlos syndrome    GERD (gastroesophageal reflux disease)    Hernia, hiatal    Hypoglycemia    Hypotension    IBS (irritable bowel syndrome)    IBS (irritable bowel syndrome)    ME (myalgic encephalomyelitis)    Migraines    Nerve pain    Obesity    PMDD (premenstrual dysphoric disorder)    Recurrent upper respiratory infection (URI)    SVT (supraventricular tachycardia)    Urticaria    Vestibular migraine     Past Surgical History:  Procedure Laterality Date   ADENOIDECTOMY     CESAREAN SECTION  2008 and 79989   GASTRECTOMY  08/2016   HEMORROIDECTOMY     INCISION AND DRAINAGE ABSCESS ANAL  04/2002, 02/1999 and 9/20011   lumber laminectomy  02/2010   SINOSCOPY  1998 and 2019   TONSILLECTOMY      TREATMENT FISTULA ANAL      The following portions of the patient's history were reviewed and updated as appropriate: allergies, current medications, past family history, past medical history, past social history, past surgical history and problem list.   Health Maintenance:   Normal pap and negative HRHPV:   Diagnosis  Date Value Ref Range Status  02/27/2024   Final   - Negative for intraepithelial lesion or malignancy (NILM)     Normal mammogram on 07/15/23.   Review of Systems:  Pertinent items noted in HPI and remainder of comprehensive ROS otherwise negative.  Physical Exam:  LMP 02/18/2023  CONSTITUTIONAL: Well-developed, well-nourished female in no acute distress.  HEENT:  Normocephalic, atraumatic. External right and left ear normal. No scleral icterus.  NECK: Normal range of motion, supple, no masses noted on observation SKIN: No rash noted. Not diaphoretic. No erythema. No pallor. MUSCULOSKELETAL: Normal range of motion. No edema noted. NEUROLOGIC: Alert and oriented to person, place, and time. Normal muscle tone coordination. No cranial nerve deficit noted. PSYCHIATRIC: Normal mood and affect. Normal behavior. Normal judgment and thought content.  PELVIC: Normal appearing external genitalia; normal urethral meatus; normal appearing vaginal mucosa and cervix.  No abnormal discharge noted. Performed in the presence  of a chaperone       GYNECOLOGY OFFICE PROCEDURE NOTE   ENDOMETRIAL BIOPSY     The indications for endometrial biopsy were reviewed.   Risks of the biopsy including cramping, bleeding, infection, uterine perforation, inadequate specimen and need for additional procedures were discussed. Offered alternative of hysteroscopy, dilation and curettage in OR. The patient states she understands the R/B/I/A and agrees to undergo procedure today.   Urine pregnancy test was Not indicated.   Consent was signed. Time out was performed.    Patient was positioned in  dorsal lithotomy position. A vaginal speculum was placed.  The cervix was visualized and was prepped with Betadine.  A single-toothed tenaculum was placed on the anterior lip of the cervix to stabilize it. The 3 mm pipelle was easily introduced into the endometrial cavity without difficulty to a depth of 7 cm, and a Scant amount of tissue was obtained after 2 passes and sent to pathology. The instruments were removed from the patient's vagina. Minimal bleeding from the cervix was noted. The patient tolerated the procedure well.  Assessment and Plan:  1. Preoperative examination (Primary) - Diagnosis: persistent postmenopausal bleeding - Planned surgery: TLH, BSO, cystoscopy, TAP block  - Risks of surgery include but are not limited to:  Bleeding - Can bleed enough to need transfusion or need for additional surgeries I.e. conversation to open surgery.  Infection - The vagina can develop cuff infection Injury to surrounding organs/tissues (i.e. bowel/bladder/ureters) - discussed how each of these is managed I.e. bladder injury requires catheter for 10-14 days after surgery Need for additional procedures - Would be specific to a complication or injury Wound complications - No specific wound unless converted to open surgery or laparoscopic Hospital re-admission - In the event of a delayed complication being recognized I.e. ureteral injury discussed Conversion to open surgery - reviewed some complications may necessitate or it may be the only way to complete the surgery.   VTE - Discussed risk of blood clots following delivery - We discussed alternatives to hysterectomy including birth control options that will work to regulate bleeding, I.e. IUD, endometrial ablation. She desires definitive management.  - We discussed postop restrictions, precautions and expectations - Preop testing needed:  No - Need for medical clearance? No - Will send message to anesthesia for preop consult. Message sent.  - All  questions answered   2. PMB (postmenopausal bleeding) EMB done today   No orders of the defined types were placed in this encounter.    Routine preventative health maintenance measures emphasized. Please refer to After Visit Summary for other counseling recommendations.   No follow-ups on file.  Vina Solian, MD, FACOG Obstetrician & Gynecologist, [facility] for [facility], [facility]

## 2024-05-07 ENCOUNTER — Other Ambulatory Visit (HOSPITAL_COMMUNITY)
Admission: RE | Admit: 2024-05-07 | Discharge: 2024-05-07 | Disposition: A | Source: Ambulatory Visit | Attending: Obstetrics and Gynecology | Admitting: Obstetrics and Gynecology

## 2024-05-07 ENCOUNTER — Ambulatory Visit: Admitting: Obstetrics and Gynecology

## 2024-05-07 ENCOUNTER — Encounter: Payer: Self-pay | Admitting: Obstetrics and Gynecology

## 2024-05-07 VITALS — BP 137/88 | HR 91 | Ht 67.0 in | Wt 144.0 lb

## 2024-05-07 DIAGNOSIS — N898 Other specified noninflammatory disorders of vagina: Secondary | ICD-10-CM | POA: Insufficient documentation

## 2024-05-07 DIAGNOSIS — N951 Menopausal and female climacteric states: Secondary | ICD-10-CM

## 2024-05-07 DIAGNOSIS — Z01818 Encounter for other preprocedural examination: Secondary | ICD-10-CM

## 2024-05-07 DIAGNOSIS — N950 Postmenopausal bleeding: Secondary | ICD-10-CM

## 2024-05-07 DIAGNOSIS — R300 Dysuria: Secondary | ICD-10-CM

## 2024-05-07 LAB — POCT URINALYSIS DIPSTICK
Bilirubin, UA: NEGATIVE
Blood, UA: NEGATIVE
Glucose, UA: NEGATIVE
Ketones, UA: NEGATIVE
Nitrite, UA: NEGATIVE
Protein, UA: NEGATIVE
Spec Grav, UA: 1.015 (ref 1.010–1.025)
Urobilinogen, UA: 0.2 U/dL
pH, UA: 6.5 (ref 5.0–8.0)

## 2024-05-07 MED ORDER — ESTRADIOL 0.05 MG/24HR TD PTWK: 0.0500 mg | MEDICATED_PATCH | TRANSDERMAL | 12 refills | Status: DC

## 2024-05-08 LAB — CERVICOVAGINAL ANCILLARY ONLY
Bacterial Vaginitis (gardnerella): NEGATIVE
Candida Glabrata: NEGATIVE
Candida Vaginitis: POSITIVE — AB
Comment: NEGATIVE
Comment: NEGATIVE
Comment: NEGATIVE

## 2024-05-09 ENCOUNTER — Ambulatory Visit: Payer: Self-pay | Admitting: Obstetrics and Gynecology

## 2024-05-09 LAB — URINE CULTURE

## 2024-05-11 ENCOUNTER — Telehealth: Payer: Self-pay

## 2024-05-11 ENCOUNTER — Telehealth: Payer: Self-pay | Admitting: *Deleted

## 2024-05-11 ENCOUNTER — Other Ambulatory Visit: Payer: Self-pay

## 2024-05-11 DIAGNOSIS — B3731 Acute candidiasis of vulva and vagina: Secondary | ICD-10-CM

## 2024-05-11 LAB — SURGICAL PATHOLOGY

## 2024-05-11 MED ORDER — FLUCONAZOLE 150 MG PO TABS: 150.0000 mg | ORAL_TABLET | Freq: Once | ORAL | 0 refills | Status: AC

## 2024-05-11 NOTE — Telephone Encounter (Signed)
 RN received message from front office to call patient in regards to results. RN attempted to call patient, left HIPAA compliant voicemail to return RN call.  Silvano LELON Piano, RN

## 2024-05-11 NOTE — Progress Notes (Signed)
 Standing orders for Diflucan  sent to pharmacy, patient notified.   Silvano LELON Piano, RN

## 2024-05-11 NOTE — Telephone Encounter (Signed)
 Patient called and would like to be contacted for treatment plan from results.

## 2024-05-12 ENCOUNTER — Encounter: Payer: Self-pay | Admitting: Physical Therapy

## 2024-05-12 ENCOUNTER — Ambulatory Visit: Admitting: Physical Therapy

## 2024-05-12 NOTE — Therapy (Unsigned)
 OUTPATIENT PHYSICAL THERAPY THORACOLUMBAR EVALUATION   Patient Name: Renee Velasquez MRN: [id number] DOB:05-Jul-1975, 48 y.o., female Today's Date: 05/12/2024  END OF SESSION:  PT End of Session - 05/12/24 1527     Visit Number 1    Number of Visits 16    Date for Recertification  07/07/24    Authorization Type BCBS    Authorization Time Period PA not required    PT Start Time 1516          Past Medical History:  Diagnosis Date   Achilles tendonitis    Allodynia    Anxiety    Arthritis    Asthma    Brachial plexopathy    CFS (chronic fatigue syndrome)    Chronic joint pain    Dizziness    Dysfunctional autonomic nervous system    Eczema    Ehlers-Danlos syndrome    GERD (gastroesophageal reflux disease)    Hernia, hiatal    Hypoglycemia    Hypotension    IBS (irritable bowel syndrome)    IBS (irritable bowel syndrome)    ME (myalgic encephalomyelitis)    Migraines    Nerve pain    Obesity    PMDD (premenstrual dysphoric disorder)    Recurrent upper respiratory infection (URI)    SVT (supraventricular tachycardia)    Urticaria    Vestibular migraine    Past Surgical History:  Procedure Laterality Date   ADENOIDECTOMY     CESAREAN SECTION  2008 and 79989   GASTRECTOMY  08/2016   HEMORROIDECTOMY     INCISION AND DRAINAGE ABSCESS ANAL  04/2002, 02/1999 and 9/20011   lumber laminectomy  02/2010   SINOSCOPY  1998 and 2019   TONSILLECTOMY     TREATMENT FISTULA ANAL     Patient Active Problem List   Diagnosis Date Noted   PTSD (post-traumatic stress disorder) 03/07/2020   Allergic rhinitis 12/21/2019   Asthma, mild intermittent 12/21/2019   History of epistaxis 12/21/2019   Status post bariatric surgery 11/06/2019   GERD (gastroesophageal reflux disease) 11/03/2019   IBS (irritable bowel syndrome) 11/03/2019   POTS (postural orthostatic tachycardia syndrome) 11/03/2019   Chronic joint pain 03/06/2019   Ehlers-Danlos syndrome 03/06/2019   Migraine without  aura and without status migrainosus, not intractable 03/06/2019    PCP: Geni Free, NP  REFERRING PROVIDER: Prentice Pinal, MD   REFERRING DIAG: S29.011A (ICD-10-CM) - Chest wall muscle strain, initial encounter  Rationale for Evaluation and Treatment: Rehabilitation  THERAPY DIAG:  No diagnosis found.  ONSET DATE: chronic but recent exacerbation.   SUBJECTIVE:  SUBJECTIVE STATEMENT: Patient apparently strained her chest while moving luggage this was about 3 weeks ago. She was under a lot of stress when this happened. She was meeting a swim team and was itching. She realized it was not the arm she had as shot in.  She found bed bugs and had to pack and unpack her luggage. She has had worsening sx since then. She is most symptomatic diffuse joint pain and stiffness.     She was diagnosed years ago around 78.  She was a counselling psychologist and used to bike, run. She is post meno and has not regularly exercised.  She is having a hysterectomy soon. She is about to get divorced Works full time.   PERTINENT HISTORY:  Ehlers-Danlos syndrome Hypertension ADHD migraine GI reflux allergies asthma  PAIN:  Are you having pain? Yes: NPRS scale: hard to rate, whole body pain   Pain location: *** Pain description: *** Aggravating factors: *** Relieving factors: ***  PRECAUTIONS: Other: ***  RED FLAGS: None   WEIGHT BEARING RESTRICTIONS: No  FALLS:  Has patient fallen in last 6 months? No reports as clumsy, tripping   LIVING ENVIRONMENT: Lives with: lives with their family Lives in: {Lives in:25570} Stairs: {opstairs:27293} Has following equipment at home: None Kids are EDS, ADHD and Autism   OCCUPATION: prn PT   PLOF: Independent, Vocation/Vocational requirements: ergo PT, into evals and simulation of  work tasks , and Leisure: limited to life demands    PATIENT GOALS: I want to   NEXT MD VISIT: ***  OBJECTIVE:  Note: Objective measures were completed at Evaluation unless otherwise noted.  DIAGNOSTIC FINDINGS:  None   PATIENT SURVEYS:  ***  COGNITION: Overall cognitive status: Within functional limits for tasks assessed     SENSATION: Neuropathy, knees to feet and hands    PALPATION: Smooth stretchy skin   LUMBAR ROM:   AROM eval  Flexion   Extension   Right lateral flexion   Left lateral flexion   Right rotation   Left rotation    (Blank rows = not tested)  POSTURE: ***  UPPER EXTREMITY ROM:   {AROM/PROM:27142} ROM Right 05/12/2024 Left 05/12/2024  Shoulder flexion    Shoulder extension    Shoulder abduction    Shoulder adduction    Shoulder internal rotation    Shoulder external rotation    Elbow flexion    Elbow extension    Wrist flexion    Wrist extension    Wrist ulnar deviation    Wrist radial deviation    Wrist pronation    Wrist supination    (Blank rows = not tested)  UPPER EXTREMITY MMT:  MMT Right 05/12/2024 Left 05/12/2024  Shoulder flexion    Shoulder extension    Shoulder abduction    Shoulder adduction    Shoulder internal rotation    Shoulder external rotation    Middle trapezius    Lower trapezius    Elbow flexion    Elbow extension    Wrist flexion    Wrist extension    Wrist ulnar deviation    Wrist radial deviation    Wrist pronation    Wrist supination    Grip strength (lbs)    (Blank rows = not tested)  SHOULDER SPECIAL TESTS:  Impingement tests: {shoulder impingement test:25231:a}  SLAP lesions: {SLAP lesions:25232}  Instability tests: {shoulder instability test:25233}  Rotator cuff assessment: {rotator cuff assessment:25234}  Biceps assessment: {biceps assessment:25235}  JOINT MOBILITY TESTING:  ***  PALPATION:  ***    {  AROM/PROM:27142}  Right eval Left eval  Hip flexion    Hip extension     Hip abduction    Hip adduction    Hip internal rotation    Hip external rotation    Knee flexion    Knee extension    Ankle dorsiflexion    Ankle plantarflexion    Ankle inversion    Ankle eversion     (Blank rows = not tested)   FUNCTIONAL TESTS:  {Functional tests:24029}  GAIT: Distance walked: *** Assistive device utilized: {Assistive devices:23999} Level of assistance: {Levels of assistance:24026} Comments: ***  TREATMENT DATE: ***                                                                                                                                 PATIENT EDUCATION:  Education details: *** Person educated: {Person educated:25204} Education method: {Education Method:25205} Education comprehension: {Education Comprehension:25206}  HOME EXERCISE PROGRAM: ***  ASSESSMENT:  CLINICAL IMPRESSION: Patient is a 48y.o. female who was seen today for physical therapy evaluation and treatment for strain of the chest wall.  Patient also has hypermobile EDS.  OBJECTIVE IMPAIRMENTS: {opptimpairments:25111}.   ACTIVITY LIMITATIONS: {activitylimitations:27494}  PARTICIPATION LIMITATIONS: {participationrestrictions:25113}  PERSONAL FACTORS: {Personal factors:25162} are also affecting patient's functional outcome.   REHAB POTENTIAL: {rehabpotential:25112}  CLINICAL DECISION MAKING: {clinical decision making:25114}  EVALUATION COMPLEXITY: {Evaluation complexity:25115}   GOALS: Goals reviewed with patient? {yes/no:20286}    LONG TERM GOALS: Target date: 06/23/2024  Home exercise Baseline:  Goal status: INITIAL  2.  Posture Baseline:  Goal status: INITIAL  3.  Pain Baseline:  Goal status: INITIAL  4.  Upper body motion Baseline:  Goal status: INITIAL  5.  *** Baseline:  Goal status: INITIAL  6.  *** Baseline:  Goal status: INITIAL  PLAN:  PT FREQUENCY: 1-2x/week  PT DURATION: 6 weeks  PLANNED INTERVENTIONS: {rehab planned  interventions:25118::97110-Therapeutic exercises,97530- Therapeutic [phone]- Neuromuscular re-education,97535- Self Rjmz,02859- Manual therapy,Patient/Family education}.  PLAN FOR NEXT SESSION: ***   Grabiela Wohlford, PT 05/12/2024, 3:28 PM

## 2024-05-13 ENCOUNTER — Encounter: Payer: Self-pay | Admitting: Physical Therapy

## 2024-05-14 ENCOUNTER — Encounter: Payer: Self-pay | Admitting: Physical Therapy

## 2024-05-14 ENCOUNTER — Ambulatory Visit (INDEPENDENT_AMBULATORY_CARE_PROVIDER_SITE_OTHER): Admitting: Physical Therapy

## 2024-05-14 DIAGNOSIS — M542 Cervicalgia: Secondary | ICD-10-CM | POA: Diagnosis not present

## 2024-05-14 DIAGNOSIS — M357 Hypermobility syndrome: Secondary | ICD-10-CM

## 2024-05-14 DIAGNOSIS — R29898 Other symptoms and signs involving the musculoskeletal system: Secondary | ICD-10-CM

## 2024-05-14 NOTE — Therapy (Signed)
 OUTPATIENT PHYSICAL THERAPY NOTE    Patient Name: Renee Velasquez MRN: [id number] DOB:[birth year], 48 y.o., female Today's Date: 05/14/2024  END OF SESSION:  PT End of Session - 05/14/24 1604     Visit Number 2    Number of Visits 16    Date for Recertification  07/07/24    Authorization Type BCBS    Authorization Time Period PA not required    PT Start Time 1602    PT Stop Time 1645    PT Time Calculation (min) 43 min    Activity Tolerance Patient tolerated treatment well    Behavior During Therapy WFL for tasks assessed/performed           Past Medical History:  Diagnosis Date   Achilles tendonitis    Allodynia    Anxiety    Arthritis    Asthma    Brachial plexopathy    CFS (chronic fatigue syndrome)    Chronic joint pain    Dizziness    Dysfunctional autonomic nervous system    Eczema    Ehlers-Danlos syndrome    GERD (gastroesophageal reflux disease)    Hernia, hiatal    Hypoglycemia    Hypotension    IBS (irritable bowel syndrome)    IBS (irritable bowel syndrome)    ME (myalgic encephalomyelitis)    Migraines    Nerve pain    Obesity    PMDD (premenstrual dysphoric disorder)    Recurrent upper respiratory infection (URI)    SVT (supraventricular tachycardia)    Urticaria    Vestibular migraine    Past Surgical History:  Procedure Laterality Date   ADENOIDECTOMY     CESAREAN SECTION  2008 and 79989   GASTRECTOMY  08/2016   HEMORROIDECTOMY     INCISION AND DRAINAGE ABSCESS ANAL  04/2002, 02/1999 and 9/20011   lumber laminectomy  02/2010   SINOSCOPY  1998 and 2019   TONSILLECTOMY     TREATMENT FISTULA ANAL     Patient Active Problem List   Diagnosis Date Noted   PTSD (post-traumatic stress disorder) 03/07/2020   Allergic rhinitis 12/21/2019   Asthma, mild intermittent 12/21/2019   History of epistaxis 12/21/2019   Status post bariatric surgery 11/06/2019   GERD (gastroesophageal reflux disease) 11/03/2019   IBS (irritable bowel syndrome)  11/03/2019   POTS (postural orthostatic tachycardia syndrome) 11/03/2019   Chronic joint pain 03/06/2019   Ehlers-Danlos syndrome 03/06/2019   Migraine without aura and without status migrainosus, not intractable 03/06/2019    PCP: Geni Free, NP  REFERRING PROVIDER: Prentice Pinal, MD   REFERRING DIAG: S29.011A (ICD-10-CM) - Chest wall muscle strain, initial encounter  Rationale for Evaluation and Treatment: Rehabilitation  THERAPY DIAG:  Hypermobility syndrome  Cervicalgia  Other symptoms and signs involving the musculoskeletal system  ONSET DATE: chronic but recent exacerbation.   SUBJECTIVE:  SUBJECTIVE STATEMENT:  I feel a bit better, more aligned exercises,  are good.     Patient apparently strained her chest while moving heavy items this was about 3 weeks ago Nov. 14th  She was under a lot of stress when this happened.  She was out of town at a hotel with her daughters swim team.  Unfortunately she found bed bugs and had to pack and unpack her luggage. She has had worsening sx since then. She reports diffuse joint pain and stiffness normally controlled quite well.  After this incident she had left-sided muscle spasm which went into her neck upper trap and then extended along the side of her neck up into her jaw.  She reports her jaw feels unstable as well.  She feels that she has had heightened nervous system since then, increased brain fog.  Normally she knows how to treat this condition herself (she is a PT) but just figured she could benefit from physical therapy to help. She was diagnosed years ago around age 65.  She was a counselling psychologist and used to bike, run. She is post meno and has not regularly exercised.  She is having a hysterectomy soon. She is about to get divorced Works full time.    PERTINENT HISTORY:  Ehlers-Danlos syndrome Hypertension ADHD migraine GI reflux allergies asthma Patient is planning a total hysterectomy 05/26/2024 Dysautonomia Migraines Multifactorial stress Anxiety and overwhelm  PAIN:  Are you having pain? Yes: NPRS scale: hard to rate, whole body pain   Pain location: Left sided neck Pain description: Full body aching tension Aggravating factors: Triggered by this event a few weeks ago Relieving factors: Nothing much helps she does pop her neck regularly and do some general movement  PRECAUTIONS: Other: None  RED FLAGS: None   WEIGHT BEARING RESTRICTIONS: No  FALLS:  Has patient fallen in last 6 months? No but reports as clumsy, tripping  Endorses feeling her whole body is out of alignment, with dizziness associated  LIVING ENVIRONMENT: Lives with: lives with their family Lives in: House/apartment Stairs: No issues Has following equipment at home: None Kids are EDS, ADHD and Autism   OCCUPATION: prn PT in factory evaluating ergonomics  PLOF: Independent, Vocation/Vocational requirements: ergo PT, into evals and simulation of work tasks , and Leisure: limited to life demands    PATIENT GOALS: I want to get rid of pain.    OBJECTIVE:  Note: Objective measures were completed at Evaluation unless otherwise noted.  DIAGNOSTIC FINDINGS:  None   PATIENT SURVEYS:  Not tested on eval  COGNITION: Overall cognitive status: Within functional limits for tasks assessed     SENSATION: Neuropathy, knees to feet and hands    PALPATION: Smooth stretchy skin   LUMBAR ROM:   AROM eval  Flexion Palms flat  Extension Not tested  Right lateral flexion Not tested  Left lateral flexion Not tested  Right rotation Within functional limits  Left rotation WFL   (Blank rows = not tested)  CERVICAL  Flexion 65  Extension 72  Lateral flexion WNL (hyper)- pain on L with Rt lateral flexion  Rotation WNL (hyper)     POSTURE: Forward head, cervical Extension, elevated scapula bilaterally  UPPER EXTREMITY ROM: WNL, hypermobile   Active ROM Right 05/14/2024 Left 05/14/2024  Shoulder flexion    Shoulder extension    Shoulder abduction    Shoulder adduction    Shoulder internal rotation    Shoulder external rotation    Elbow flexion  Elbow extension    Wrist flexion    Wrist extension    Wrist ulnar deviation    Wrist radial deviation    Wrist pronation    Wrist supination    (Blank rows = not tested)  UPPER EXTREMITY MMT: [facility]   MMT Right 05/14/2024 Left 05/14/2024  Shoulder flexion    Shoulder extension    Shoulder abduction    Shoulder adduction    Shoulder internal rotation    Shoulder external rotation    Middle trapezius    Lower trapezius    Elbow flexion    Elbow extension    Wrist flexion    Wrist extension    Wrist ulnar deviation    Wrist radial deviation    Wrist pronation    Wrist supination    Grip strength (lbs)    (Blank rows = not tested)  SHOULDER SPECIAL TESTS:    NT   JOINT MOBILITY TESTING:  Instability bilateral shoulders  PALPATION:  Hypermobile cervical spine Increased muscle tone and tension along bilateral upper traps levator Scap posterior cervicals and along the suboccipital ridge   FUNCTIONAL TESTS:  Not tested on eval  GAIT: Distance walked: 150 Assistive device utilized: None Level of assistance: Complete Independence Comments: No deviations noted  TREATMENT DATE:    [facility] Adult PT Treatment:                                                DATE: 05/14/24  Manual Therapy: Soft tissue work to bilateral upper traps, levator scap, Rt SCM and lateral cervicals into TMJ area  Prone IASTM to mid thoracic paraspinals rhomboid TrP on L side  Neuromuscular re-ed: Supine stabilization cervical spine Pilates circle pull in and press out Overhead lift   [facility] Adult PT Treatment:                                                DATE:  05/12/24 Therapeutic Exercise: Exercises given for posture and isometric shoulder Scap and neck Manual Therapy: Manual therapy applied to the neck and upper back focusing on the left side,  Trigger point therapy Self Care: Home program Plan of care, evaluation findings                                                                                                                                  PATIENT EDUCATION:  Education details: Hypermobility education plan of care Person educated: Patient Education method: Explanation, Demonstration, and emailed home program Education comprehension: verbalized understanding  HOME EXERCISE PROGRAM: Access Code: [email or web address] URL: [email or web address] Date: 05/13/2024 Prepared by: Delon Norma  Exercises - Supine Cervical Retraction with Towel  -  1 x daily - 7 x weekly - 2 sets - 10 reps - 5 hold - Shoulder Flexion Serratus Activation with Resistance  - 1 x daily - 7 x weekly - 2 sets - 10 reps - 5 hold - Shoulder External Rotation and Scapular Retraction with Resistance  - 1 x daily - 7 x weekly - 2 sets - 10 reps - 5 hold  ASSESSMENT:  CLINICAL IMPRESSION: Patient did well with initial exercises, reviewed concept briefly but focused today mostly on manual therapy for reducing fascial  tension along posterolateral cervicals and upper back.  Uses IASTM tool but manual felt better to patient. Pain improved after session.   Patient is a 48y.o. female who was seen today for physical therapy evaluation and treatment for strain of the chest wall.  Patient also has hypermobile EDS. patient seems to have a cascade of events triggered by a bedbug incident November 14.  This is resulted in allergic reaction a flare of her MCAS and subsequent functional impairments.  Therapy will help to provide her with gentle graded stabilization exercises and nervous system stabilization.  OBJECTIVE IMPAIRMENTS: decreased activity tolerance, decreased  mobility, decreased strength, increased fascial restrictions, impaired sensation, postural dysfunction, pain, and hypermobility .   ACTIVITY LIMITATIONS: carrying, lifting, bending, sitting, standing, squatting, locomotion level, and caring for others  PARTICIPATION LIMITATIONS: meal prep, cleaning, interpersonal relationship, driving, shopping, community activity, and occupation  PERSONAL FACTORS: 3+ comorbidities: EDS, MCAS, POTS, other co-morbidities  are also affecting patient's functional outcome.   REHAB POTENTIAL: Excellent  CLINICAL DECISION MAKING: Unstable/unpredictable  EVALUATION COMPLEXITY: High   GOALS: Goals reviewed with patient? Yes    LONG TERM GOALS: Target date: 06/23/2024  Patient will be independent with final HEP upon discharge from PT and report consistent benefit following exercise completion.    Baseline:  Goal status: INITIAL  2.  Patient will be able to demonstrate proper posture and lifting techniques related to spine health and reduction of symptoms.   Baseline:  Goal status: INITIAL  3.  Pt will report pain levels in upper quadrant back to baseline and not limiting her ability to perform light activities in the home.  Baseline:  Goal status: INITIAL  4.  Patient will be able to use self-regulating, breathing exercises to reduce overall anxiety and nervous system regulation  baseline:  Goal status: INITIAL  5.  Patient will report improved ability to tolerate 10 minutes of endurance exercise with vital signs stable using the recumbent bike. Baseline:  Goal status: INITIAL  6.  Patient will be able to tolerate light upper body strengthening exercises without increasing neck pain or flareup Baseline:  Goal status: INITIAL  PLAN:  PT FREQUENCY: 1-2x/week  PT DURATION: 6 weeks- 8 weeks patient having surgery 12/23 which may interrupt course of therapy  PLANNED INTERVENTIONS: 97164- PT Re-evaluation, 97750- Physical Performance Testing,  97110-Therapeutic exercises, 97530- Therapeutic activity, [id number]- Neuromuscular re-education, 97535- Self Care, [id number]- Manual therapy, Patient/Family education, and Taping.  PLAN FOR NEXT SESSION: full HEP, difficulty getting visits in before her hysterectomy    Johnattan Strassman, PT 05/14/2024, 4:05 PM   Delon Norma, PT 05/14/2024 4:51 PM Phone: [phone] Fax: [phone]

## 2024-05-18 ENCOUNTER — Encounter (HOSPITAL_COMMUNITY): Payer: Self-pay | Admitting: Obstetrics and Gynecology

## 2024-05-18 NOTE — Progress Notes (Signed)
 Surgical Instructions  Your procedure is scheduled on :  Tuesday December 23rd, 2025 Report to [facility] Main Entrance A at 9:30 AM, then check in the Admitting office. Any questions or running late day of surgery :  call [phone]  Questions prior to your surgery day:  call [phone], Monday -- Friday 8am - 4pm. If you experience any cold or flu symptoms such as cough, fever, chills, shortness of breath, etc. between now and you scheduled surgery, please notify your surgeon office.   Remember: Do Not eat any food after midnight the night before surgery. You may have clear liquids from midnight night before surgery until 8:30 AM.   Clear liquids allowed are:  Water             Carbonated Beverages (diabetics choose diet or no sugar options)  Clear Tea ( no milk, no honey, etc.)  Black coffee ( NO MILK, CREAM OR POWDERED CREAMER OF ANY KIND)  Sport drinks, like Gatorade (diabetes choose diet or no sugar options)  NO clear liquid after 8:30 AM day of surgery.  This includes No water,  candy,  gum, and mints.  Take these medicines the morning of surgery with A SIPS OF WATER:  Protonix, Zoloft and Ultram .   May take these medicines IF NEEDED:  Klonopin.   One week prior to surgery, STOP taking any Aspirin (unless otherwise instructed by your surgeon) Aleve, Naproxen, ibuprofen, Motrin, Advil, Goody's, BC's, all herbal medications/ supplements, fish oil, and non-prescription vitamins.  Do NOT Smoke (tobacco/ vaping) and Do Not drink alcohol for 24 hours prior to your procedure.  For those patients that use a CPAP.  Please bring your CPAP/ mask/ tubing with them day of surgery . Anesthesia may ask recovery room nurse to use and if you stay the night you be asked to use it.  You will be asked to removed any contacts, glasses, piercing's, hearing aid's, dentures/ partials prior to surgery.  Please bring cases/ container/ solution/ etc., for them day of surgery.   Patients discharged  the day of surgery will NOT be allowed to drive home.  You must have responsible driver and caregiver to stay at home with you the next 24 hours.  SURGICAL WAITING ROOM VISITATION Patients may have no more than 2 support people in the waiting area - if more than 2 , these visitors may rotate.  Pre-op nurse will coordinate an appropriate time for 1 Adult support person, who may not rotate, to accompany patient in pre-op.  Aware some patients may have certain circumstances, speak to pre-op nurse day of surgery.  Children under the age 25 must have an adult with them who is not the patient and must remain in the main waiting area with an adult.  If the patient needs to stay at the hospital during part of their recovery, the visitor guidelines for inpatient rooms apply.  Please refer to the [facility] website for the visitor guidelines for any additional information.  If you received a COVID test during your pre-op visit it is requested that you wear a mask when out in public, stay away from anyone that may not be feeling well and notify your surgeon if you develop symptoms.  If you have been in contact with anyone that has tested positive in the past 10 days notify your surgeon.     [facility] - Preparing for Surgery  Before surgery, you can play an important role. Because skin is not sterile, it needs  to be as free of germs as possible. You can reduce the number of germs on your skin by washing with CHG (chlorhexidine gluconate) soap before surgery. CHG is an antiseptic cleaner which kills germs and bonds with the skin to continue killing germs even after washing. Oral hygiene is also important in reducing the risk of infection. Remember to brush your teeth with your regular toothpaste the morning of surgery.  Please DO NOT use if you have an allergy to CHG or antibacterial soaps. If your skin becomes reddened/irritated stop using the CHG and inform your Pre-op nurse day of surgery.  DO NOT shave  (including legs and genital area) for at least 48 hours prior to your CHG shower.   Please follow these instructions carefully:  Shower with CHG soap the night before surgery. If you choose to wash your hair, wash your hair first as usual with your normal shampoo. After you shampoo, rinse your hair and body thoroughly to remove the shampoo. Use CHG as you would any other liquid soap. You can apply CHG directly to the skin and wash gently with a clean washcloth or shower sponge. Apply the CHG soap to your body ONLY FROM THE NECK DOWN. Do not use on open wounds or open sores. Avoid contact with your eyes, ears, mouth, and genitals (private parts). Wash genitals (private parts) with your normal soap. Wash thoroughly, paying special attention to the area where your surgery will be performed. Thoroughly rinse your body with warm water from the neck down. DO NOT shower/wash with your normal soap after using and rinsing off the CHG soap. DO NOT use lotions, oils, etc., after showering with CHG. Pat yourself dry with a clean towel. Wear clean pajamas. Place clean sheets on your bed the night of your CHG shower and do not sleep with pets.  Day of Surgery  DO NOT Apply any lotions,  powder,  oils,  deodorants (may use underarm deodorant),  cologne/  perfumes  or makeup Do Not wear jewelry /  piercing's/  metal/  permanent jewelry must be removed prior to arrival day of surgery. (No plastic piercing) Do Not wear nail polish,  gel polish,  artificial nails, or any other type of covering on natural finger nails (toe nails are okay) Remember to brush your teeth and rinse mouth out. Put on clean / comfortable clothes. [facility] is not responsible for valuables/ personal belongings

## 2024-05-18 NOTE — Progress Notes (Signed)
 Spoke w/ via phone for pre-op interview--- [facility] needs dos----  T&S and CBC per surgeon. Lab appt. 05/25/24 at 1 PM.       Lab results------ COVID test -----patient states asymptomatic no test needed Arrive at -------0930 NPO after MN NO Solid Food.  Clear liquids from MN until---0830 Pre-Surgery Ensure or G2:  Med rec completed Medications to take morning of surgery -----Klonopin-PRN, Protonix, Zoloft and Ultram . Diabetic medication -----  GLP1 agonist last dose: GLP1 instructions:  Patient instructed no nail polish to be worn day of surgery Patient instructed to bring photo id and insurance card day of surgery Patient aware to have Driver (ride ) / caregiver    for 24 hours after surgery - Husband Monserrat Vidaurri Patient Special Instructions ----- CHG shower night before surgery. Pre-Op special Instructions -----  Patient verbalized understanding of instructions that were given at this phone interview. Patient denies chest pain, sob, fever, cough at the interview.

## 2024-05-22 ENCOUNTER — Other Ambulatory Visit: Payer: Self-pay

## 2024-05-22 ENCOUNTER — Telehealth: Payer: Self-pay | Admitting: *Deleted

## 2024-05-22 ENCOUNTER — Ambulatory Visit: Admitting: Physical Therapy

## 2024-05-22 MED ORDER — FLUCONAZOLE 150 MG PO TABS: 150.0000 mg | ORAL_TABLET | Freq: Once | ORAL | 0 refills | Status: AC

## 2024-05-22 NOTE — Progress Notes (Signed)
 Patient called and is scheduled for hysterectomy for Tuesday 05/26/24. Patient states she is dealing with a yeast infection currently. Patient states it usually takes her two doses of diflucan  to get rid of yeast infection. Sent in two tablets and explained she should wait 48 hours between doses.   Patient states she is having vaginal itching and white discharge. Pharmacy verified. Delon Barrio RN

## 2024-05-22 NOTE — Telephone Encounter (Signed)
 Patient needs a call back today for medication prior to surgery on Tuesday.

## 2024-05-25 ENCOUNTER — Telehealth: Payer: Self-pay

## 2024-05-25 ENCOUNTER — Encounter (HOSPITAL_COMMUNITY)
Admission: RE | Admit: 2024-05-25 | Discharge: 2024-05-25 | Disposition: A | Discharge status: Home / Self Care | Source: Ambulatory Visit | Attending: Obstetrics and Gynecology | Admitting: Obstetrics and Gynecology

## 2024-05-25 DIAGNOSIS — N950 Postmenopausal bleeding: Secondary | ICD-10-CM | POA: Insufficient documentation

## 2024-05-25 DIAGNOSIS — Q796 Ehlers-Danlos syndrome, unspecified: Secondary | ICD-10-CM | POA: Insufficient documentation

## 2024-05-25 DIAGNOSIS — Z9884 Bariatric surgery status: Secondary | ICD-10-CM | POA: Diagnosis not present

## 2024-05-25 DIAGNOSIS — Z79890 Hormone replacement therapy (postmenopausal): Secondary | ICD-10-CM | POA: Diagnosis not present

## 2024-05-25 DIAGNOSIS — Z01812 Encounter for preprocedural laboratory examination: Secondary | ICD-10-CM | POA: Insufficient documentation

## 2024-05-25 DIAGNOSIS — N888 Other specified noninflammatory disorders of cervix uteri: Secondary | ICD-10-CM | POA: Diagnosis not present

## 2024-05-25 DIAGNOSIS — N840 Polyp of corpus uteri: Secondary | ICD-10-CM | POA: Diagnosis present

## 2024-05-25 DIAGNOSIS — N72 Inflammatory disease of cervix uteri: Secondary | ICD-10-CM | POA: Diagnosis not present

## 2024-05-25 DIAGNOSIS — D250 Submucous leiomyoma of uterus: Secondary | ICD-10-CM | POA: Diagnosis not present

## 2024-05-25 DIAGNOSIS — Z87891 Personal history of nicotine dependence: Secondary | ICD-10-CM | POA: Diagnosis not present

## 2024-05-25 DIAGNOSIS — D251 Intramural leiomyoma of uterus: Secondary | ICD-10-CM | POA: Diagnosis not present

## 2024-05-25 DIAGNOSIS — Z98891 History of uterine scar from previous surgery: Secondary | ICD-10-CM | POA: Diagnosis not present

## 2024-05-25 LAB — TYPE AND SCREEN
ABO/RH(D): O POS
Antibody Screen: NEGATIVE

## 2024-05-25 LAB — CBC
HCT: 36.3 % (ref 36.0–46.0)
Hemoglobin: 10.6 g/dL — ABNORMAL LOW (ref 12.0–15.0)
MCH: 23.2 pg — ABNORMAL LOW (ref 26.0–34.0)
MCHC: 29.2 g/dL — ABNORMAL LOW (ref 30.0–36.0)
MCV: 79.4 fL — ABNORMAL LOW (ref 80.0–100.0)
Platelets: 406 K/uL — ABNORMAL HIGH (ref 150–400)
RBC: 4.57 MIL/uL (ref 3.87–5.11)
RDW: 15.8 % — ABNORMAL HIGH (ref 11.5–15.5)
WBC: 7.2 K/uL (ref 4.0–10.5)
nRBC: 0 % (ref 0.0–0.2)

## 2024-05-25 NOTE — Telephone Encounter (Signed)
 RN called pharmacy to clarify Estradiol  patch, spoke with Press photographer, error was from September on dispensing, not completely sure on the 0.025 mg and if that was the one dispensed, However, last dispensed was original prescription for Cl[place]ara  0.5 on 05/08/24.  Silvano LELON Piano, RN

## 2024-05-26 ENCOUNTER — Encounter (HOSPITAL_COMMUNITY): Admission: RE | Payer: Self-pay | Source: Home / Self Care

## 2024-05-26 ENCOUNTER — Encounter (HOSPITAL_COMMUNITY): Payer: Self-pay | Admitting: Obstetrics and Gynecology

## 2024-05-26 ENCOUNTER — Ambulatory Visit (HOSPITAL_COMMUNITY): Payer: Self-pay | Admitting: Vascular Surgery

## 2024-05-26 ENCOUNTER — Ambulatory Visit (HOSPITAL_COMMUNITY)
Admission: RE | Admit: 2024-05-26 | Discharge: 2024-05-26 | Disposition: A | Discharge status: Home / Self Care | Attending: Obstetrics and Gynecology | Admitting: Obstetrics and Gynecology

## 2024-05-26 ENCOUNTER — Encounter (HOSPITAL_COMMUNITY): Payer: Self-pay | Admitting: Vascular Surgery

## 2024-05-26 DIAGNOSIS — N840 Polyp of corpus uteri: Secondary | ICD-10-CM

## 2024-05-26 DIAGNOSIS — D251 Intramural leiomyoma of uterus: Secondary | ICD-10-CM | POA: Insufficient documentation

## 2024-05-26 DIAGNOSIS — N888 Other specified noninflammatory disorders of cervix uteri: Secondary | ICD-10-CM

## 2024-05-26 DIAGNOSIS — Z9884 Bariatric surgery status: Secondary | ICD-10-CM | POA: Insufficient documentation

## 2024-05-26 DIAGNOSIS — Z98891 History of uterine scar from previous surgery: Secondary | ICD-10-CM | POA: Insufficient documentation

## 2024-05-26 DIAGNOSIS — N72 Inflammatory disease of cervix uteri: Secondary | ICD-10-CM | POA: Insufficient documentation

## 2024-05-26 DIAGNOSIS — D259 Leiomyoma of uterus, unspecified: Secondary | ICD-10-CM | POA: Diagnosis not present

## 2024-05-26 DIAGNOSIS — Z79890 Hormone replacement therapy (postmenopausal): Secondary | ICD-10-CM | POA: Insufficient documentation

## 2024-05-26 DIAGNOSIS — N950 Postmenopausal bleeding: Secondary | ICD-10-CM

## 2024-05-26 DIAGNOSIS — Q796 Ehlers-Danlos syndrome, unspecified: Secondary | ICD-10-CM

## 2024-05-26 DIAGNOSIS — Z87891 Personal history of nicotine dependence: Secondary | ICD-10-CM | POA: Insufficient documentation

## 2024-05-26 DIAGNOSIS — D250 Submucous leiomyoma of uterus: Secondary | ICD-10-CM | POA: Insufficient documentation

## 2024-05-26 HISTORY — PX: CYSTOSCOPY: SHX5120

## 2024-05-26 HISTORY — PX: TOTAL LAPAROSCOPIC HYSTERECTOMY WITH BILATERAL SALPINGO OOPHORECTOMY: SHX6845

## 2024-05-26 LAB — ABO/RH: ABO/RH(D): O POS

## 2024-05-26 SURGERY — HYSTERECTOMY, TOTAL, LAPAROSCOPIC, WITH BILATERAL SALPINGO-OOPHORECTOMY
Anesthesia: General | Site: Pelvis

## 2024-05-26 MED ORDER — SUGAMMADEX SODIUM 200 MG/2ML IV SOLN
INTRAVENOUS | Status: DC | PRN
  Administered 2024-05-26: 200 mg via INTRAVENOUS

## 2024-05-26 MED ORDER — HYDROMORPHONE HCL 1 MG/ML IJ SOLN
INTRAMUSCULAR | Status: AC
  Filled 2024-05-26: qty 0.5

## 2024-05-26 MED ORDER — PROPOFOL 10 MG/ML IV BOLUS
INTRAVENOUS | Status: DC | PRN
  Administered 2024-05-26: 150 ug/kg/min via INTRAVENOUS
  Administered 2024-05-26: 200 mg via INTRAVENOUS

## 2024-05-26 MED ORDER — LACTATED RINGERS IV SOLN: INTRAVENOUS | Status: DC

## 2024-05-26 MED ORDER — DEXAMETHASONE SOD PHOSPHATE PF 10 MG/ML IJ SOLN
INTRAMUSCULAR | Status: DC | PRN
  Administered 2024-05-26: 10 mg via INTRAVENOUS

## 2024-05-26 MED ORDER — SCOPOLAMINE 1 MG/3DAYS TD PT72
1.0000 | MEDICATED_PATCH | TRANSDERMAL | Status: DC
  Administered 2024-05-26: 1 mg via TRANSDERMAL

## 2024-05-26 MED ORDER — HYDROMORPHONE HCL 1 MG/ML IJ SOLN
0.2500 mg | INTRAMUSCULAR | Status: DC | PRN
  Administered 2024-05-26: 0.5 mg via INTRAVENOUS

## 2024-05-26 MED ORDER — ORAL CARE MOUTH RINSE: 15.0000 mL | Freq: Once | OROMUCOSAL | Status: AC

## 2024-05-26 MED ORDER — POVIDONE-IODINE 10 % EX SWAB: 2.0000 | Freq: Once | CUTANEOUS | Status: DC

## 2024-05-26 MED ORDER — SCOPOLAMINE 1 MG/3DAYS TD PT72
MEDICATED_PATCH | TRANSDERMAL | Status: AC
  Filled 2024-05-26: qty 1

## 2024-05-26 MED ORDER — SENNOSIDES 25 MG PO TABS: 50.0000 mg | ORAL_TABLET | Freq: Every day | ORAL | Status: AC

## 2024-05-26 MED ORDER — AMISULPRIDE (ANTIEMETIC) 5 MG/2ML IV SOLN: 10.0000 mg | Freq: Once | INTRAVENOUS | Status: DC | PRN

## 2024-05-26 MED ORDER — CHLORHEXIDINE GLUCONATE 0.12 % MT SOLN
OROMUCOSAL | Status: AC
  Filled 2024-05-26: qty 15

## 2024-05-26 MED ORDER — SODIUM CHLORIDE 0.9 % IR SOLN
Status: DC | PRN
  Administered 2024-05-26: 1000 mL

## 2024-05-26 MED ORDER — LIDOCAINE 2% (20 MG/ML) 5 ML SYRINGE
INTRAMUSCULAR | Status: DC | PRN
  Administered 2024-05-26: 100 mg via INTRAVENOUS

## 2024-05-26 MED ORDER — BUPIVACAINE LIPOSOME 1.3 % IJ SUSP
INTRAMUSCULAR | Status: DC | PRN
  Administered 2024-05-26: 40 mL

## 2024-05-26 MED ORDER — ACETAMINOPHEN 500 MG PO TABS
1000.0000 mg | ORAL_TABLET | ORAL | Status: AC
  Administered 2024-05-26: 1000 mg via ORAL

## 2024-05-26 MED ORDER — ONDANSETRON HCL 4 MG/2ML IJ SOLN: 4.0000 mg | Freq: Once | INTRAMUSCULAR | Status: DC | PRN

## 2024-05-26 MED ORDER — KETAMINE HCL 50 MG/5ML IJ SOSY
PREFILLED_SYRINGE | INTRAMUSCULAR | Status: AC
  Filled 2024-05-26: qty 5

## 2024-05-26 MED ORDER — FENTANYL CITRATE (PF) 100 MCG/2ML IJ SOLN
INTRAMUSCULAR | Status: AC
  Filled 2024-05-26: qty 2

## 2024-05-26 MED ORDER — 0.9 % SODIUM CHLORIDE (POUR BTL) OPTIME
TOPICAL | Status: DC | PRN
  Administered 2024-05-26: 1000 mL

## 2024-05-26 MED ORDER — FLUCONAZOLE 150 MG PO TABS: 150.0000 mg | ORAL_TABLET | ORAL | 3 refills | Status: AC

## 2024-05-26 MED ORDER — PROPOFOL 10 MG/ML IV BOLUS
INTRAVENOUS | Status: AC
  Filled 2024-05-26: qty 20

## 2024-05-26 MED ORDER — ONDANSETRON HCL 4 MG/2ML IJ SOLN
INTRAMUSCULAR | Status: DC | PRN
  Administered 2024-05-26: 4 mg via INTRAVENOUS

## 2024-05-26 MED ORDER — CEFAZOLIN SODIUM-DEXTROSE 2-4 GM/100ML-% IV SOLN
INTRAVENOUS | Status: AC
  Filled 2024-05-26: qty 100

## 2024-05-26 MED ORDER — OXYCODONE HCL 5 MG PO TABS: 5.0000 mg | ORAL_TABLET | Freq: Once | ORAL | Status: DC | PRN

## 2024-05-26 MED ORDER — CHLORHEXIDINE GLUCONATE 0.12 % MT SOLN
15.0000 mL | Freq: Once | OROMUCOSAL | Status: AC
  Administered 2024-05-26: 15 mL via OROMUCOSAL

## 2024-05-26 MED ORDER — HYDROMORPHONE HCL 1 MG/ML IJ SOLN
INTRAMUSCULAR | Status: AC
  Filled 2024-05-26: qty 1

## 2024-05-26 MED ORDER — ONDANSETRON HCL 4 MG/2ML IJ SOLN
INTRAMUSCULAR | Status: AC
  Filled 2024-05-26: qty 2

## 2024-05-26 MED ORDER — KETOROLAC TROMETHAMINE 30 MG/ML IJ SOLN
INTRAMUSCULAR | Status: DC | PRN
  Administered 2024-05-26: 15 mg via INTRAVENOUS

## 2024-05-26 MED ORDER — MIDAZOLAM HCL 2 MG/2ML IJ SOLN
INTRAMUSCULAR | Status: AC
  Filled 2024-05-26: qty 2

## 2024-05-26 MED ORDER — CEFAZOLIN SODIUM-DEXTROSE 2-4 GM/100ML-% IV SOLN
2.0000 g | INTRAVENOUS | Status: AC
  Administered 2024-05-26: 2 g via INTRAVENOUS

## 2024-05-26 MED ORDER — ACETAMINOPHEN 500 MG PO TABS
ORAL_TABLET | ORAL | Status: AC
  Filled 2024-05-26: qty 2

## 2024-05-26 MED ORDER — KETAMINE HCL 50 MG/5ML IJ SOSY
PREFILLED_SYRINGE | INTRAMUSCULAR | Status: DC | PRN
  Administered 2024-05-26: 30 mg via INTRAVENOUS

## 2024-05-26 MED ORDER — ROCURONIUM BROMIDE 10 MG/ML (PF) SYRINGE
PREFILLED_SYRINGE | INTRAVENOUS | Status: DC | PRN
  Administered 2024-05-26: 20 mg via INTRAVENOUS
  Administered 2024-05-26: 60 mg via INTRAVENOUS
  Administered 2024-05-26: 30 mg via INTRAVENOUS
  Administered 2024-05-26 (×3): 20 mg via INTRAVENOUS

## 2024-05-26 MED ORDER — OXYCODONE HCL 5 MG PO TABS: 5.0000 mg | ORAL_TABLET | ORAL | 0 refills | Status: AC | PRN

## 2024-05-26 MED ORDER — SOD CITRATE-CITRIC ACID 500-334 MG/5ML PO SOLN: 30.0000 mL | ORAL | Status: DC

## 2024-05-26 MED ORDER — MIDAZOLAM HCL (PF) 2 MG/2ML IJ SOLN
INTRAMUSCULAR | Status: DC | PRN
  Administered 2024-05-26: 2 mg via INTRAVENOUS

## 2024-05-26 MED ORDER — OXYCODONE HCL 5 MG/5ML PO SOLN: 5.0000 mg | Freq: Once | ORAL | Status: DC | PRN

## 2024-05-26 MED ORDER — HEMOSTATIC AGENTS (NO CHARGE) OPTIME
TOPICAL | Status: DC | PRN
  Administered 2024-05-26 (×2): 1

## 2024-05-26 MED ORDER — ACETAMINOPHEN 500 MG PO TABS: 500.0000 mg | ORAL_TABLET | Freq: Four times a day (QID) | ORAL | 0 refills | Status: AC | PRN

## 2024-05-26 MED ORDER — MEPERIDINE HCL 25 MG/ML IJ SOLN: 6.2500 mg | INTRAMUSCULAR | Status: DC | PRN

## 2024-05-26 MED ORDER — KETOROLAC TROMETHAMINE 30 MG/ML IJ SOLN: 30.0000 mg | Freq: Once | INTRAMUSCULAR | Status: DC | PRN

## 2024-05-26 MED ORDER — METRONIDAZOLE 500 MG PO TABS: 500.0000 mg | ORAL_TABLET | Freq: Two times a day (BID) | ORAL | 0 refills | Status: AC

## 2024-05-26 MED ORDER — HYDROMORPHONE HCL 1 MG/ML IJ SOLN
INTRAMUSCULAR | Status: DC | PRN
  Administered 2024-05-26: .5 mg via INTRAVENOUS

## 2024-05-26 MED ORDER — KETOROLAC TROMETHAMINE 30 MG/ML IJ SOLN
INTRAMUSCULAR | Status: AC
  Filled 2024-05-26: qty 1

## 2024-05-26 MED ORDER — ESTRADIOL 0.05 MG/24HR TD PTTW: 1.0000 | MEDICATED_PATCH | TRANSDERMAL | 12 refills | Status: AC

## 2024-05-26 SURGICAL SUPPLY — 41 items
CHLORAPREP W/TINT 26 (MISCELLANEOUS) ×2 IMPLANT
COVER MAYO STAND STRL (DRAPES) ×2 IMPLANT
DEFOGGER SCOPE WARM SEASHARP (MISCELLANEOUS) ×2 IMPLANT
DERMABOND ADVANCED .7 DNX12 (GAUZE/BANDAGES/DRESSINGS) ×2 IMPLANT
DRAPE SURG IRRIG POUCH 19X23 (DRAPES) ×2 IMPLANT
GLOVE BIO SURGEON STRL SZ 6 (GLOVE) ×4 IMPLANT
GLOVE BIOGEL PI IND STRL 7.0 (GLOVE) ×4 IMPLANT
GOWN STRL REUS W/ TWL LRG LVL3 (GOWN DISPOSABLE) ×4 IMPLANT
HIBICLENS CHG 4% 4OZ BTL (MISCELLANEOUS) ×4 IMPLANT
IRRIGATION SUCT STRKRFLW 2 WTP (MISCELLANEOUS) ×2 IMPLANT
KIT PINK PAD W/HEAD ARM REST (MISCELLANEOUS) ×2 IMPLANT
KIT TURNOVER KIT B (KITS) ×2 IMPLANT
LHOOK LAP DISP 36CM (ELECTROSURGICAL) ×2 IMPLANT
LIGASURE LAP MARYLAND 5MM 37CM (ELECTROSURGICAL) IMPLANT
MANIPULATOR ADVINCU DEL 2.5 PL (MISCELLANEOUS) IMPLANT
MANIPULATOR ADVINCU DEL 3.0 PL (MISCELLANEOUS) IMPLANT
MANIPULATOR ADVINCU DEL 3.5 PL (MISCELLANEOUS) IMPLANT
MANIPULATOR ADVINCU DEL 4.0 PL (MISCELLANEOUS) IMPLANT
NDL INSUFFLATION 14GA 120MM (NEEDLE) ×2 IMPLANT
NEEDLE INSUFFLATION 14GA 120MM (NEEDLE) ×2 IMPLANT
PACK LAPAROSCOPY BASIN (CUSTOM PROCEDURE TRAY) ×2 IMPLANT
PAD OB MATERNITY 11 LF (PERSONAL CARE ITEMS) ×2 IMPLANT
PENCIL SMOKE EVACUATOR (MISCELLANEOUS) ×2 IMPLANT
POUCH LAPAROSCOPIC INSTRUMENT (MISCELLANEOUS) ×2 IMPLANT
POWDER SURGICEL 3.0 GRAM (HEMOSTASIS) IMPLANT
SEALER ENSEAL CRVD JAW TIP 37 (MISCELLANEOUS) IMPLANT
SET CYSTO IRRIGATION (SET/KITS/TRAYS/PACK) ×2 IMPLANT
SET TUBE SMOKE EVAC HIGH FLOW (TUBING) ×2 IMPLANT
SLEEVE ADV FIXATION 5X100MM (TROCAR) ×4 IMPLANT
SOLN 0.9% NACL POUR BTL 1000ML (IV SOLUTION) ×2 IMPLANT
SPIKE FLUID TRANSFER (MISCELLANEOUS) ×2 IMPLANT
SUT VIC AB 4-0 PS2 18 (SUTURE) ×2 IMPLANT
SUT VLOC 180 0 9IN GS21 (SUTURE) ×2 IMPLANT
SYR 10ML LL (SYRINGE) ×2 IMPLANT
SYR 50ML LL SCALE MARK (SYRINGE) ×2 IMPLANT
TIP ENDOSCOPIC SURGICEL (TIP) IMPLANT
TOWEL GREEN STERILE FF (TOWEL DISPOSABLE) ×2 IMPLANT
TRAY FOLEY W/BAG SLVR 14FR (SET/KITS/TRAYS/PACK) ×2 IMPLANT
TROCAR ADV FIXATION 5X100MM (TROCAR) ×2 IMPLANT
TROCAR KII 8X100ML NONTHREADED (TROCAR) ×2 IMPLANT
UNDERPAD 30X36 HEAVY ABSORB (UNDERPADS AND DIAPERS) ×2 IMPLANT

## 2024-05-26 NOTE — Anesthesia Postprocedure Evaluation (Signed)
"   Anesthesia Post Note  Patient: Renee Velasquez  Procedure(s) Performed: HYSTERECTOMY, TOTAL, LAPAROSCOPIC, WITH BILATERAL SALPINGO-OOPHORECTOMY WITH TRANSVERSUS ABDOMINUS PLANE BLOCK (Bilateral: Pelvis) CYSTOSCOPY (Bladder)     Patient location during evaluation: PACU Anesthesia Type: General Level of consciousness: awake and alert, oriented and patient cooperative Pain management: pain level controlled Vital Signs Assessment: post-procedure vital signs reviewed and stable Respiratory status: spontaneous breathing, nonlabored ventilation and respiratory function stable Cardiovascular status: blood pressure returned to baseline and stable Postop Assessment: no apparent nausea or vomiting Anesthetic complications: no   No notable events documented.  Last Vitals:  Vitals:   05/26/24 1515 05/26/24 1530  BP: 132/87 129/78  Pulse: (!) 56 (!) 56  Resp: 16 15  Temp: (!) 35.8 C (!) 36.1 C  SpO2: 98% 98%    Last Pain:  Vitals:   05/26/24 1515  TempSrc:   PainSc: 0-No pain                 Almarie HERO Avelyn Touch      "

## 2024-05-26 NOTE — Anesthesia Procedure Notes (Signed)
 Procedure Name: Intubation Date/Time: 05/26/2024 11:34 AM  Performed by: Kamarie Veno A, CRNAPre-anesthesia Checklist: Patient identified, Emergency Drugs available, Suction available and Patient being monitored Patient Re-evaluated:Patient Re-evaluated prior to induction Oxygen Delivery Method: Circle System Utilized Preoxygenation: Pre-oxygenation with 100% oxygen Induction Type: IV induction Ventilation: Mask ventilation without difficulty Laryngoscope Size: Glidescope and 3 Grade View: Grade I Tube type: Oral Tube size: 7.0 mm Number of attempts: 1 Airway Equipment and Method: Stylet and Oral airway Placement Confirmation: ETT inserted through vocal cords under direct vision, positive ETCO2 and breath sounds checked- equal and bilateral Secured at: 22 cm Tube secured with: Tape Dental Injury: Teeth and Oropharynx as per pre-operative assessment

## 2024-05-26 NOTE — Anesthesia Preprocedure Evaluation (Addendum)
"                                    Anesthesia Evaluation  Patient identified by MRN, date of birth, ID band Patient awake    Reviewed: Allergy & Precautions, H&P , NPO status , Patient's Chart, lab work & pertinent test results  Airway Mallampati: III  TM Distance: >3 FB Neck ROM: Full    Dental  (+) Teeth Intact, Dental Advisory Given   Pulmonary asthma (well controlled) , former smoker   Pulmonary exam normal breath sounds clear to auscultation       Cardiovascular Normal cardiovascular exam Rhythm:Regular Rate:Normal   had a full cardiac workup 7-8 years ago in [place] . Autonomic nervous system. 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 Gets hypoglycemic, migraines, low BP  Reproductive/Obstetrics negative OB ROS                              Anesthesia Physical Anesthesia Plan  ASA: 3  Anesthesia Plan: General   Post-op Pain Management: Tylenol  PO (pre-op)*, Toradol  IV (intra-op)*, Ketamine  IV* and Dilaudid  IV   Induction: Intravenous  PONV Risk Score and Plan: 3 and Ondansetron , Dexamethasone , Midazolam , Treatment may vary due to age or medical condition, Propofol  infusion, TIVA and Scopolamine  patch - Pre-op  Airway Management Planned: Oral ETT and Video Laryngoscope Planned  Additional Equipment: None  Intra-op Plan:   Post-operative Plan: Extubation in OR  Informed Consent: I have reviewed the patients History and Physical, chart, labs and discussed the  procedure including the risks, benefits and alternatives for the proposed anesthesia with the patient or authorized representative who has indicated his/her understanding and acceptance.     Dental advisory given  Plan Discussed with: CRNA  Anesthesia Plan Comments: (Did well with anesthesia for gastric bypass in 2021 per pt, unsure if TIVA vs gas per record. Will TIVA today, glide)         Anesthesia Quick Evaluation  "

## 2024-05-26 NOTE — Op Note (Signed)
 Rickey Weil PROCEDURE DATE: 05/26/2024  PREOPERATIVE DIAGNOSIS: Persistent postmenopausal bleeding on HRT (negative EMB) POSTOPERATIVE DIAGNOSIS: The same PROCEDURE: Total laparoscopic hysterectomy, bilateral salpingo-oophorectomy, cystoscopy, TAP block SURGEON:  Dr. Vina Solian ASSISTANT:  Dr. Buzz Carolin and Dr. Bebe Furry.  An experienced assistant was required given the standard of surgical care given the complexity of the case.  This assistant was needed for exposure, dissection, suctioning, retraction, instrument exchange, and for overall help during the procedure. Dr. Carolin was present for the hysterectomy and closure of the cuff. Dr. Furry was present for the remainder of the surgery.   INDICATIONS: 48 y.o. [id number]  here for definitive surgical management secondary to the indications listed under preoperative diagnoses; please see preoperative note for further details.  Risks of surgery were discussed with the patient including but not limited to: bleeding which may require transfusion or reoperation; infection which may require antibiotics; injury to bowel, bladder, ureters or other surrounding organs; need for additional procedures; thromboembolic phenomenon, incisional problems and other postoperative/anesthesia complications. Written informed consent was obtained.    FINDINGS:  Normal EFG. Normal vagina. Normal cervix. Uterus was retroverted. Uterus sounded to 7 cm consistent with her US . Uterus was normal in appearance. Ovaries and tubes normal in appearance. Normal abdominal survey with no adhesions except a small area of omental adhesions to the anterior abdominal wall inferior to the umbilicus. These were left in placed. Normal appearing bladder without suture or injury. Bilateral brisk ureteral jets.   ANESTHESIA:    General INTRAVENOUS FLUIDS:1100  ml ESTIMATED BLOOD LOSS: 50 ml URINE OUTPUT: 400 ml  SPECIMENS: Uterus, cervix, bilateral fallopian tubes,  ovaries COMPLICATIONS: None immediate  PROCEDURE IN DETAIL:  The patient received intravenous antibiotics and had sequential compression devices applied to her lower extremities while in the preoperative area.  She was then taken to the operating room where general anesthesia was administered and was found to be adequate.  She was placed in the dorsal lithotomy position, and was prepped and draped in a sterile manner.    After an adequate timeout was performed, the Avincula uterine manipulator was placed at this time.  A Foley catheter was inserted into her bladder and attached to constant drainage.   Attention turned to patient's abdomen. Skin incision made with the scalpel in the umbilicus and closed technique used for entry using the veress needle. Opening pressure was 2.  Pneumoperitoneum achieved to a pressure of 15.  0 degree laparoscope was used with the 5mm optiview port and trocar to enter the cavity under direct visualization. Inspection below the point of entry revealed no evidence of bowel injury. Patient placed in steep trendelenburg. Lateral 5 mm ports were placed as well as an 8mm suprapubic port all under direct visualization.   I placed a  transversus abdominus plane block  using laparoscopic guidance at T10 and T7 bilaterally, at the junction of the rectus anterior and band of oblique muscles.  10 cc of 0.25% bupivacaine  at each site, 40 cc total = 100 mg of bupivacaine .  Doyle's bubble technique was used. This was placed for post operative pain management.  The pelvis was then carefully examined. Attention was turned to the infundibulopelvic ligament on the patient's left side, which was clamped using the Ligasure scalpel and ligated.  Good hemostasis was noted. The broad ligament was also ligated with the Ligasure scalpel working towards the round ligament.  The round and broad ligaments were then clamped and transected with the Enseal. The uterine artery was then  skeletonized and a  bladder flap was created.  The ureters were noted to be safely away from the area of dissection.  The bladder was then bluntly dissected off the lower uterine segment.  At this point, attention was turned to the uterine vessels, which were clamped and ligated using the Ligasure.  Good hemostasis was noted overall.  The uterosacral and cardinal ligaments were clamped, cut and ligated bilaterally.  Attention was then turned to the cervicovaginal junction, and the bovie hook was used to transect the cervix from the surrounding vagina using the ring of the Avincula as a guide. This was done circumferentially allowing total hysterectomy.  The uterus was then removed from the vagina and the vaginal cuff incision was then closed with 0 V-lock.  Overall excellent hemostasis was noted.  The ureters were reexamined bilaterally and were pulsating normally. There was some mild oozing from the peritoneal dissection. Surgicel powder was applied. The abdominal pressure was reduced and hemostasis still elusive due to these small areas of bleeding. There were grasped superficially with the [place]  grasper and cauterized briefly. Arista was applied, hemostasis was assured.   The foley was removed and then cystoscopy was then performed and it showed brisk bilateral ureteral jets.  No stitches were visualized in the bladder during cystoscopy and no injury was present.   After changing gown and gloves, the pelvis was once again inspected and hemostatic. All trocars were removed under direct visualization, and the abdomen was desufflated. All skin incisions were closed with 4-0 Vicryl subcuticular stitches and Dermabond. The patient tolerated the procedures well.    All instruments, needles, and sponge counts were correct x 2. The patient was taken to the recovery room awake, extubated and in stable condition.    Vina Solian, MD, FACOG Obstetrician & Gynecologist, [facility] for [facility], [facility]

## 2024-05-26 NOTE — Interval H&P Note (Signed)
 History and Physical Interval Note:  05/26/2024 10:46 AM  Renee Velasquez  has presented today for surgery, with the diagnosis of PMB polyp.  The various methods of treatment have been discussed with the patient and family. After consideration of risks, benefits and other options for treatment, the patient has consented to  Procedures: HYSTERECTOMY, TOTAL, LAPAROSCOPIC, WITH BILATERAL SALPINGO-OOPHORECTOMY (Bilateral) CYSTOSCOPY (N/A) as a surgical intervention.  The patient's history has been reviewed, patient examined, no change in status, stable for surgery.  I have reviewed the patient's chart and labs.  Questions were answered to the patient's satisfaction.     Vina Solian

## 2024-05-26 NOTE — Transfer of Care (Signed)
 Immediate Anesthesia Transfer of Care Note  Patient: Renee Velasquez  Procedure(s) Performed: HYSTERECTOMY, TOTAL, LAPAROSCOPIC, WITH BILATERAL SALPINGO-OOPHORECTOMY WITH TRANSVERSUS ABDOMINUS PLANE BLOCK (Bilateral: Pelvis) CYSTOSCOPY (Bladder)  Patient Location: PACU  Anesthesia Type:General  Level of Consciousness: sedated  Airway & Oxygen Therapy: Patient Spontanous Breathing and Patient connected to face mask oxygen  Post-op Assessment: Report given to RN and Post -op Vital signs reviewed and stable  Post vital signs: Reviewed and stable  Last Vitals:  Vitals Value Taken Time  BP 146/89 05/26/24 13:46  Temp    Pulse 56 05/26/24 13:45  Resp 21 05/26/24 13:47  SpO2 100 % 05/26/24 13:45  Vitals shown include unfiled device data.  Last Pain:  Vitals:   05/26/24 0850  TempSrc: Oral  PainSc: 4       Patients Stated Pain Goal: 4 (05/26/24 0850)  Complications: No notable events documented.

## 2024-05-27 ENCOUNTER — Encounter (HOSPITAL_COMMUNITY): Payer: Self-pay | Admitting: Obstetrics and Gynecology

## 2024-05-29 LAB — SURGICAL PATHOLOGY

## 2024-05-31 ENCOUNTER — Ambulatory Visit: Payer: Self-pay | Admitting: Obstetrics and Gynecology

## 2024-06-08 ENCOUNTER — Telehealth: Payer: Self-pay | Admitting: *Deleted

## 2024-06-08 NOTE — Telephone Encounter (Signed)
 Returned call from 3:12 PM. Left patient a detailed message about FMLA process and payment.

## 2024-06-09 ENCOUNTER — Encounter: Payer: Self-pay | Admitting: Physical Therapy

## 2024-06-22 ENCOUNTER — Telehealth: Payer: Self-pay | Admitting: *Deleted

## 2024-06-22 NOTE — Telephone Encounter (Signed)
 RN received message to call pharmacy regarding clarification of patch. Per pharmacy had a previous order for weekly Estradiol  patch. Per review of chart, Dr. Cleatus sent to pharmacy Estradiol  patch to be placed 2x a week.   Silvano LELON Piano, RN

## 2024-06-22 NOTE — Telephone Encounter (Signed)
 Sarah from [facility] would like a return call for clarification of a prescription. [phone].

## 2024-06-23 NOTE — Progress Notes (Unsigned)
" ° °  POSTOPERATIVE VISIT NOTE   Subjective:     Roniyah Roylance is a 49 y.o. [id number] who presents to the clinic 4 weeks status post total laparoscopic hysterectomy and bilateral oophorectomy for PMB persisten on HT. Eating a regular diet without difficulty. Bowel movements are {normal/abnormal***:19619}. {pain control:13522::The patient is not having any pain.} Incisions: *** Vaginal bleeding: *** Resumed sexual acitivity: ***  The following portions of the patient's history were reviewed and updated as appropriate: allergies, current medications, past family history, past medical history, past social history, past surgical history, and problem list..   Review of Systems Pertinent items are noted in HPI.    Objective:    LMP 02/18/2023  General:  {gen appearance:16600}  Abdomen: {post op abd exam:13497::soft,bowel sounds active,non-tender}  Incision:   {incision:13716::healing well,no drainage,no dehiscence,no erythema,no hernia,no seroma,incision well approximated,no swelling}  Pelvic:   {Exam; pelvic:30843}    Pathology Results: Benign   Assessment:   {doing well:13525::Doing well postoperatively.} Operative findings again reviewed. Pathology report discussed.   Plan:    1. Postop check (Primary) ***   Activity restrictions: {restrictions:13723} Anticipated return to work: {work return:14002}. Follow up: 12 wks postop  Vina Solian, MD Obstetrician & Gynecologist, [facility] for [facility], [facility] "

## 2024-06-29 ENCOUNTER — Encounter: Admitting: Obstetrics and Gynecology

## 2024-06-29 DIAGNOSIS — Z09 Encounter for follow-up examination after completed treatment for conditions other than malignant neoplasm: Secondary | ICD-10-CM

## 2024-06-30 ENCOUNTER — Encounter: Payer: Self-pay | Admitting: Obstetrics and Gynecology

## 2024-06-30 ENCOUNTER — Encounter: Payer: Self-pay | Admitting: Physical Therapy

## 2024-07-01 ENCOUNTER — Encounter: Payer: Self-pay | Admitting: Obstetrics and Gynecology

## 2024-07-01 ENCOUNTER — Ambulatory Visit: Admitting: Obstetrics and Gynecology

## 2024-07-01 VITALS — BP 116/80 | HR 80 | Ht 67.0 in | Wt 140.0 lb

## 2024-07-01 DIAGNOSIS — Z09 Encounter for follow-up examination after completed treatment for conditions other than malignant neoplasm: Secondary | ICD-10-CM

## 2024-07-01 NOTE — Progress Notes (Signed)
" ° °  POSTOPERATIVE VISIT NOTE   Subjective:     Renee Velasquez is a 49 y.o. [id number] who presents to the clinic 4 weeks status post total laparoscopic hysterectomy and bilateral oophorectomy for PMB persisten on HT. Eating a regular diet without difficulty. Bowel movements are normal. The patient is not having any pain.  Incisions: Healing well overall but one on her left has some reaction maybe to the suture.  Vaginal bleeding: None Resumed sexual acitivity: Not yet.   The following portions of the patient's history were reviewed and updated as appropriate: allergies, current medications, past family history, past medical history, past social history, past surgical history, and problem list..   Review of Systems Pertinent items are noted in HPI.    Objective:    BP 116/80   Pulse 80   Ht 5' 7 (1.702 m)   Wt 140 lb (63.5 kg)   LMP 02/18/2023   BMI 21.93 kg/m  General:  alert, cooperative, and no distress  Abdomen: soft, bowel sounds active, non-tender  Incision:   healing well, no drainage, no erythema, no hernia, no seroma, no swelling, no dehiscence, incision well approximated  Pelvic:   Exam deferred.    Pathology Results: Benign   Assessment:   Doing well postoperatively. Operative findings again reviewed. Pathology report discussed.   Plan:    1. Postop check (Primary) Activity restrictions: None except no sexual activity until cleared (esp in light of Mertha Danlos Anticipated return to work: now. Follow up: 12 wks postop  Vina Solian, MD Obstetrician & Gynecologist, [facility] for [facility], [facility] "

## 2024-07-06 ENCOUNTER — Other Ambulatory Visit: Payer: Self-pay | Admitting: Medical Genetics

## 2024-07-10 ENCOUNTER — Ambulatory Visit

## 2024-07-10 ENCOUNTER — Ambulatory Visit: Admitting: Physical Therapy

## 2024-07-10 ENCOUNTER — Encounter: Payer: Self-pay | Admitting: Physical Therapy

## 2024-07-10 DIAGNOSIS — M542 Cervicalgia: Secondary | ICD-10-CM

## 2024-07-10 DIAGNOSIS — M357 Hypermobility syndrome: Secondary | ICD-10-CM

## 2024-07-10 DIAGNOSIS — R29898 Other symptoms and signs involving the musculoskeletal system: Secondary | ICD-10-CM

## 2024-07-10 NOTE — Therapy (Signed)
 " OUTPATIENT PHYSICAL THERAPY NOTE /Re-Cert.    Patient Name: Renee Velasquez MRN: [id number] DOB:04/04/1976, 49 y.o., female Today's Date: 07/10/2024  END OF SESSION:  PT End of Session - 07/10/24 0801     Visit Number 3    Date for Recertification  09/04/24    Authorization Type BCBS    Authorization Time Period PA not required    PT Start Time 0800    PT Stop Time 0845    PT Time Calculation (min) 45 min    Activity Tolerance Patient tolerated treatment well    Behavior During Therapy WFL for tasks assessed/performed           Past Medical History:  Diagnosis Date   Achilles tendonitis    Allodynia    Anxiety    Arthritis    Asthma    Brachial plexopathy    CFS (chronic fatigue syndrome)    Chronic joint pain    Dizziness    Dysfunctional autonomic nervous system    Eczema    Ehlers-Danlos syndrome    GERD (gastroesophageal reflux disease)    Hernia, hiatal    Hypoglycemia    Hypotension    IBS (irritable bowel syndrome)    IBS (irritable bowel syndrome)    ME (myalgic encephalomyelitis)    Migraines    Nerve pain    Obesity    PMDD (premenstrual dysphoric disorder)    Recurrent upper respiratory infection (URI)    SVT (supraventricular tachycardia)    Urticaria    Vestibular migraine    Past Surgical History:  Procedure Laterality Date   ADENOIDECTOMY     CESAREAN SECTION  2008 and 79989   CYSTOSCOPY N/A 05/26/2024   Procedure: CYSTOSCOPY;  Surgeon: Cleatus Moccasin, MD;  Location: [facility] OR;  Service: Gynecology;  Laterality: N/A;   GASTRECTOMY  08/2016   HEMORROIDECTOMY     INCISION AND DRAINAGE ABSCESS ANAL  04/2002, 02/1999 and 9/20011   lumber laminectomy  02/2010   SINOSCOPY  1998 and 2019   TONSILLECTOMY     TOTAL LAPAROSCOPIC HYSTERECTOMY WITH BILATERAL SALPINGO OOPHORECTOMY Bilateral 05/26/2024   Procedure: HYSTERECTOMY, TOTAL, LAPAROSCOPIC, WITH BILATERAL SALPINGO-OOPHORECTOMY WITH TRANSVERSUS ABDOMINUS PLANE BLOCK;  Surgeon: Cleatus Moccasin, MD;   Location: [facility] OR;  Service: Gynecology;  Laterality: Bilateral;   TREATMENT FISTULA ANAL     Patient Active Problem List   Diagnosis Date Noted   Postmenopausal bleeding 05/26/2024   Chronic fatigue syndrome 11/29/2021   Attention deficit hyperactivity disorder, predominantly inattentive type 11/29/2021   PTSD (post-traumatic stress disorder) 03/07/2020   Allergic rhinitis 12/21/2019   Asthma, mild intermittent 12/21/2019   History of epistaxis 12/21/2019   Status post bariatric surgery 11/06/2019   GERD (gastroesophageal reflux disease) 11/03/2019   IBS (irritable bowel syndrome) 11/03/2019   POTS (postural orthostatic tachycardia syndrome) 11/03/2019   Chronic joint pain 03/06/2019   Ehlers-Danlos syndrome 03/06/2019   Migraine without aura and without status migrainosus, not intractable 03/06/2019    PCP: Geni Free, NP  REFERRING PROVIDER: Prentice Pinal, MD   REFERRING DIAG: S29.011A (ICD-10-CM) - Chest wall muscle strain, initial encounter  Rationale for Evaluation and Treatment: Rehabilitation  THERAPY DIAG:  Hypermobility syndrome  Cervicalgia  Other symptoms and signs involving the musculoskeletal system  ONSET DATE: chronic but recent exacerbation.   SUBJECTIVE:  SUBJECTIVE STATEMENT:  Patient is back to work this week and she is having a lot of pain. The pain is still L side of neck and head , mild into jaw. Recovery from surgery went well.      Patient apparently strained her chest while moving heavy items this was about 3 weeks ago Nov. 14th  She was under a lot of stress when this happened.  She was out of town at a hotel with her daughters swim team.  Unfortunately she found bed bugs and had to pack and unpack her luggage. She has had worsening sx since then. She reports diffuse  joint pain and stiffness normally controlled quite well.  After this incident she had left-sided muscle spasm which went into her neck upper trap and then extended along the side of her neck up into her jaw.  She reports her jaw feels unstable as well.  She feels that she has had heightened nervous system since then, increased brain fog.  Normally she knows how to treat this condition herself (she is a PT) but just figured she could benefit from physical therapy to help. She was diagnosed years ago around age 77.  She was a counselling psychologist and used to bike, run. She is post meno and has not regularly exercised.  She is having a hysterectomy soon. She is about to get divorced Works full time.   PERTINENT HISTORY:  Ehlers-Danlos syndrome Hypertension ADHD migraine GI reflux allergies asthma Patient is planning a total hysterectomy 05/26/2024 Dysautonomia Migraines Multifactorial stress Anxiety and overwhelm  PAIN:  Are you having pain? Yes: NPRS scale: hard to rate, whole body pain   Pain location: Left sided neck Pain description: Full body aching tension Aggravating factors: Triggered by this event a few weeks ago Relieving factors: Nothing much helps she does pop her neck regularly and do some general movement  PRECAUTIONS: Other: None  RED FLAGS: None   WEIGHT BEARING RESTRICTIONS: No  FALLS:  Has patient fallen in last 6 months? No but reports as clumsy, tripping  Endorses feeling her whole body is out of alignment, with dizziness associated  LIVING ENVIRONMENT: Lives with: lives with their family Lives in: House/apartment Stairs: No issues Has following equipment at home: None Kids are EDS, ADHD and Autism   OCCUPATION: prn PT in factory evaluating ergonomics  PLOF: Independent, Vocation/Vocational requirements: ergo PT, into evals and simulation of work tasks , and Leisure: limited to life demands    PATIENT GOALS: I want to get rid of pain.    OBJECTIVE:  Note:  Objective measures were completed at Evaluation unless otherwise noted.  DIAGNOSTIC FINDINGS:  None   PATIENT SURVEYS:  Not tested on eval  COGNITION: Overall cognitive status: Within functional limits for tasks assessed     SENSATION: Neuropathy, knees to feet and hands    PALPATION: Smooth stretchy skin   LUMBAR ROM:   AROM eval  Flexion Palms flat  Extension Not tested  Right lateral flexion Not tested  Left lateral flexion Not tested  Right rotation Within functional limits  Left rotation WFL   (Blank rows = not tested)  CERVICAL  Flexion 65  Extension 72  Lateral flexion WNL (hyper)- pain on L with Rt lateral flexion  Rotation WNL (hyper)    POSTURE: Forward head, cervical Extension, elevated scapula bilaterally  UPPER EXTREMITY ROM: WNL, hypermobile   Active ROM Right 07/10/2024 Left 07/10/2024  Shoulder flexion    Shoulder extension    Shoulder abduction  Shoulder adduction    Shoulder internal rotation    Shoulder external rotation    Elbow flexion    Elbow extension    Wrist flexion    Wrist extension    Wrist ulnar deviation    Wrist radial deviation    Wrist pronation    Wrist supination    (Blank rows = not tested)  UPPER EXTREMITY MMT: [facility]   MMT Right 07/10/2024 Left 07/10/2024  Shoulder flexion 5 5  Shoulder extension    Shoulder abduction 4+ 4+  Shoulder adduction    Shoulder internal rotation 4+ 4+  Shoulder external rotation 4+ 4+  Middle trapezius    Lower trapezius    Elbow flexion    Elbow extension    Wrist flexion    Wrist extension    Wrist ulnar deviation    Wrist radial deviation    Wrist pronation    Wrist supination    Grip strength (lbs)    (Blank rows = not tested)  SHOULDER SPECIAL TESTS:    NT   JOINT MOBILITY TESTING:  Instability bilateral shoulders  PALPATION:  Hypermobile cervical spine Increased muscle tone and tension along bilateral upper traps levator Scap posterior cervicals and along the  suboccipital ridge Skin is stretchy, smooth and soft Multiple trigger points throughout upper traps levator Scap and along the suboccipital ridge FUNCTIONAL TESTS:  Not tested on eval  GAIT: Distance walked: 150 Assistive device utilized: None Level of assistance: Complete Independence Comments: No deviations noted  TREATMENT DATE:    [facility] Adult PT Treatment:                                                DATE: 07/10/24 Therapeutic Exercise: Recumbent bike 6 min L2-3 Cervical mobility, light mid range  Therapeutic Activity: Wall for posture  Blue band horizontal pull Serratus activation  green looped band  ER green loop  OH lift green loop   Manual Therapy: Soft tissue work to bilateral upper traps, levator scap, Rt SCM and lateral cervicals into TMJ area   [facility] Adult PT Treatment:                                                DATE: 05/14/24  Manual Therapy: Soft tissue work to bilateral upper traps, levator scap, Rt SCM and lateral cervicals into TMJ area  Prone IASTM to mid thoracic paraspinals rhomboid TrP on L side  Neuromuscular re-ed:  Supine stabilization cervical spine Pilates circle pull in and press out Overhead lift   [facility] Adult PT Treatment:                                                DATE: 05/12/24 Therapeutic Exercise: Exercises given for posture and isometric shoulder Scap and neck Manual Therapy: Manual therapy applied to the neck and upper back focusing on the left side,  Trigger point therapy Self Care: Home program Plan of care, evaluation findings  PATIENT EDUCATION:  Education details: Hypermobility education plan of care Person educated: Patient Education method: Explanation, Demonstration, and emailed home program Education comprehension: verbalized understanding  HOME EXERCISE PROGRAM: Access Code:  [email or web address] Access Code: [email or web address] URL: [email or web address] Date: 07/10/2024 Prepared by: Delon Norma  Exercises - Supine Cervical Retraction with Towel  - 1 x daily - 7 x weekly - 2 sets - 10 reps - 5 hold - Shoulder Flexion Serratus Activation with Resistance  - 1 x daily - 7 x weekly - 2 sets - 10 reps - 5 hold - Shoulder External Rotation and Scapular Retraction with Resistance  - 1 x daily - 7 x weekly - 2 sets - 10 reps - 5 hold - Standing Isometric Cervical Rotation  - 1 x daily - 7 x weekly - 2 sets - 10 reps - 5 hold - Standing Isometric Cervical Flexion with Manual Resistance  - 1 x daily - 7 x weekly - 2 sets - 10 reps - 5 hold - Standing Cervical Rotation AROM  - 1 x daily - 7 x weekly - 2 sets - 10 reps ASSESSMENT:  CLINICAL IMPRESSION:  Patient able to work on light upper extremity and postural exercises against the wall.  Manual therapy offered to reduce fascial tension along the posterior cervical and upper back region..  Recommend she do some light isometrics in order to build muscular endurance and cervical stabilization as she returns to work.  Her return to work has been impacted by the recovery.  For her hysterectomy so she does feel a bit deconditioned there.  Attendance to PT will be sporadic because of her work schedule and so she hopes to just catch appointments via MyChart when they become available.  Patient has a great understanding of Ehlers-Danlos syndrome but most of her understanding is admittedly more about comorbidities and pharmacology seeing a physical therapist who specializes in EDS for the musculoskeletal system is beneficial for her.   Patient did well with initial exercises, reviewed concept briefly but focused today mostly on manual therapy for reducing fascial  tension along posterolateral cervicals and upper back.  Uses IASTM tool but manual felt better to patient. Pain improved after session.   Patient is a 48y.o. female who was seen  today for physical therapy evaluation and treatment for strain of the chest wall.  Patient also has hypermobile EDS. patient seems to have a cascade of events triggered by a bedbug incident November 14.  This is resulted in allergic reaction a flare of her MCAS and subsequent functional impairments.  Therapy will help to provide her with gentle graded stabilization exercises and nervous system stabilization.  OBJECTIVE IMPAIRMENTS: decreased activity tolerance, decreased mobility, decreased strength, increased fascial restrictions, impaired sensation, postural dysfunction, pain, and hypermobility .   ACTIVITY LIMITATIONS: carrying, lifting, bending, sitting, standing, squatting, locomotion level, and caring for others  PARTICIPATION LIMITATIONS: meal prep, cleaning, interpersonal relationship, driving, shopping, community activity, and occupation  PERSONAL FACTORS: 3+ comorbidities: EDS, MCAS, POTS, other co-morbidities  are also affecting patient's functional outcome.   REHAB POTENTIAL: Excellent  CLINICAL DECISION MAKING: Unstable/unpredictable  EVALUATION COMPLEXITY: High   GOALS: Goals reviewed with patient? Yes  LONG TERM GOALS: Target date: 06/23/2024  Patient will be independent with final HEP upon discharge from PT and report consistent benefit following exercise completion.    Baseline:  Goal status: ongoing   2.  Patient will be able to demonstrate proper posture and lifting techniques related to spine health and reduction of symptoms.  Baseline:  Goal status: ongoing   3.  Pt will report pain levels in upper quadrant back to baseline and not limiting her ability to perform light activities in the home.  Baseline:  Goal status: ongoing   4.  Patient will be able to use self-regulating, breathing exercises to reduce overall anxiety and nervous system regulation  baseline:  Goal status: ongoing   5.  Patient will report improved ability to tolerate 10 minutes of  endurance exercise with vital signs stable using the recumbent bike. Baseline:  Goal status: ongoing   6.  Patient will be able to tolerate light upper body strengthening exercises without increasing neck pain or flareup Baseline:  Goal status: ongoing   PLAN:  PT FREQUENCY: 1-2x/week  PT DURATION: 6 weeks- 8 weeks patient having surgery 12/23 which may interrupt course of therapy  PLANNED INTERVENTIONS: 97164- PT Re-evaluation, 97750- Physical Performance Testing, 97110-Therapeutic exercises, 97530- Therapeutic activity, [id number]- Neuromuscular re-education, 97535- Self Care, [id number]- Manual therapy, Patient/Family education, and Taping.  PLAN FOR NEXT SESSION: full HEP, manual therapy.  Try Pilates   Malavika Lira, PT 07/10/2024, 8:54 AM   Delon Norma, PT 07/10/24 8:54 AM Phone: [phone] Fax: [phone]  "

## 2024-07-17 ENCOUNTER — Ambulatory Visit

## 2024-08-20 ENCOUNTER — Encounter: Admitting: Obstetrics and Gynecology
